# Patient Record
Sex: Male | Born: 1960 | Race: White | Hispanic: No | Marital: Married | Smoking: Former smoker
Health system: Southern US, Community
[De-identification: ages and names within clinical notes are randomized; demographics above are authoritative.]

## PROBLEM LIST (undated history)

## (undated) DIAGNOSIS — I10 Essential (primary) hypertension: Secondary | ICD-10-CM

## (undated) DIAGNOSIS — I2510 Atherosclerotic heart disease of native coronary artery without angina pectoris: Secondary | ICD-10-CM

## (undated) DIAGNOSIS — I509 Heart failure, unspecified: Secondary | ICD-10-CM

## (undated) HISTORY — PX: VASECTOMY: SHX75

## (undated) HISTORY — PX: CARDIAC SURGERY: SHX584

## (undated) HISTORY — PX: CHOLECYSTECTOMY, LAPAROSCOPIC: SHX56

---

## 2008-10-07 ENCOUNTER — Ambulatory Visit: Payer: Self-pay | Admitting: Cardiology

## 2008-10-07 ENCOUNTER — Observation Stay (HOSPITAL_COMMUNITY): Admission: EM | Admit: 2008-10-07 | Discharge: 2008-10-08 | Payer: Self-pay | Admitting: Emergency Medicine

## 2008-10-08 ENCOUNTER — Encounter: Payer: Self-pay | Admitting: Internal Medicine

## 2010-05-29 LAB — CBC
Platelets: 201 10*3/uL (ref 150–400)
RDW: 12.5 % (ref 11.5–15.5)

## 2010-05-29 LAB — POCT CARDIAC MARKERS: Myoglobin, poc: 94.7 ng/mL (ref 12–200)

## 2010-05-29 LAB — POCT I-STAT, CHEM 8
BUN: 16 mg/dL (ref 6–23)
Chloride: 108 mEq/L (ref 96–112)
HCT: 47 % (ref 39.0–52.0)
Sodium: 139 mEq/L (ref 135–145)
TCO2: 23 mmol/L (ref 0–100)

## 2010-05-29 LAB — DIFFERENTIAL
Basophils Absolute: 0 10*3/uL (ref 0.0–0.1)
Basophils Relative: 0 % (ref 0–1)
Eosinophils Relative: 1 % (ref 0–5)
Lymphocytes Relative: 30 % (ref 12–46)
Neutro Abs: 5.4 10*3/uL (ref 1.7–7.7)

## 2010-05-29 LAB — TROPONIN I
Troponin I: 0.01 ng/mL (ref 0.00–0.06)
Troponin I: 0.02 ng/mL (ref 0.00–0.06)

## 2010-05-29 LAB — CK TOTAL AND CKMB (NOT AT ARMC)
Relative Index: 0.8 (ref 0.0–2.5)
Total CK: 121 U/L (ref 7–232)
Total CK: 142 U/L (ref 7–232)

## 2016-09-12 DIAGNOSIS — I10 Essential (primary) hypertension: Secondary | ICD-10-CM | POA: Insufficient documentation

## 2018-09-24 DIAGNOSIS — N183 Chronic kidney disease, stage 3 unspecified: Secondary | ICD-10-CM | POA: Insufficient documentation

## 2020-09-23 ENCOUNTER — Ambulatory Visit (INDEPENDENT_AMBULATORY_CARE_PROVIDER_SITE_OTHER): Payer: No Typology Code available for payment source | Admitting: Family Medicine

## 2020-09-23 ENCOUNTER — Other Ambulatory Visit: Payer: Self-pay

## 2020-09-23 ENCOUNTER — Encounter: Payer: Self-pay | Admitting: Family Medicine

## 2020-09-23 DIAGNOSIS — I10 Essential (primary) hypertension: Secondary | ICD-10-CM

## 2020-09-23 DIAGNOSIS — K219 Gastro-esophageal reflux disease without esophagitis: Secondary | ICD-10-CM | POA: Insufficient documentation

## 2020-09-23 DIAGNOSIS — N529 Male erectile dysfunction, unspecified: Secondary | ICD-10-CM | POA: Diagnosis not present

## 2020-09-23 MED ORDER — EPINEPHRINE 0.3 MG/0.3ML IJ SOAJ: 0.3000 mg | INTRAMUSCULAR | 0 refills | Status: DC | PRN

## 2020-09-23 MED ORDER — LOSARTAN POTASSIUM 100 MG PO TABS: 100.0000 mg | ORAL_TABLET | Freq: Every day | ORAL | 1 refills | Status: DC

## 2020-09-23 MED ORDER — SILDENAFIL CITRATE 20 MG PO TABS: ORAL_TABLET | ORAL | 5 refills | Status: DC

## 2020-09-23 NOTE — Patient Instructions (Signed)
Great to meet you today! Please schedule annual exam at your convenience.  

## 2020-09-23 NOTE — Assessment & Plan Note (Signed)
Using the sildenafil as needed.  This continues to work well for him without side effects.

## 2020-09-23 NOTE — Assessment & Plan Note (Signed)
Blood pressure is mildly elevated today.  Readings at home have been well controlled.  Recommend continuation of losartan in addition to a low-sodium diet.

## 2020-09-23 NOTE — Progress Notes (Signed)
Paul Hardy - 59 y.o. male MRN [id number]  Date of birth: Jul 10, 1960  Subjective Chief Complaint  Patient presents with   Establish Care   Hypertension    HPI Paul Hardy is a 60 year old male here today for initial visit to establish care.  He has history of hypertension and GERD.  He reports he is doing well for the most part.  He did receive a yellowjacket sting yesterday.  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  He took Benadryl.  He did not feel he needed to use an EpiPen.  Also his EpiPen is expired.  He is doing well with losartan for management of hypertension.  He is taking daily as directed.  He has not had any symptoms related to his blood pressure including chest pain, shortness of breath, palpitations, headache or vision changes.  He is taking sildenafil as needed for ED.  This works well.  ROS:  A comprehensive ROS was completed and negative except as noted per HPI  Allergies  Allergen Reactions   Bee Venom Anaphylaxis   Iodinated Diagnostic Agents Anaphylaxis    And cardiac arrest   Hydrochlorothiazide Other (See Comments)    Dehydration and muscle spasms. Lasix does the same   Lisinopril Cough    Losartan is OK    History reviewed. No pertinent past medical history.  Past Surgical History:  Procedure Laterality Date   CHOLECYSTECTOMY, LAPAROSCOPIC     VASECTOMY      Social History   Socioeconomic History   Marital status: Married    Spouse name: Not on file   Number of children: Not on file   Years of education: Not on file   Highest education level: Not on file  Occupational History   Not on file  Tobacco Use   Smoking status: Every Day    Packs/day: 1.00    Years: 20.00    Pack years: 20.00    Types: Cigarettes    Start date: 02/21/1998    Passive exposure: Never   Smokeless tobacco: Never  Vaping Use   Vaping Use: Never used  Substance and Sexual Activity   Alcohol use: Never   Drug use: Never   Sexual activity: Yes    Partners: Female  Other  Topics Concern   Not on file  Social History Narrative   Not on file   Social Determinants of Health   Financial Resource Strain: Not on file  Food Insecurity: Not on file  Transportation Needs: Not on file  Physical Activity: Not on file  Stress: Not on file  Social Connections: Not on file    History reviewed. No pertinent family history.  Health Maintenance  Topic Date Due   Pneumococcal Vaccine 68-56 Years old (1 - PCV) Never done   HIV Screening  Never done   Hepatitis C Screening  Never done   COLONOSCOPY (Pts 45-4yrs Insurance coverage will need to be confirmed)  Never done   INFLUENZA VACCINE  09/21/2020   TETANUS/TDAP  06/07/2029   COVID-19 Vaccine  Completed   Zoster Vaccines- Shingrix  Completed   HPV VACCINES  Aged Out     ----------------------------------------------------------------------------------------------------------------------------------------------------------------------------------------------------------------- Physical Exam BP 140/90 (BP Location: Left Arm, Patient Position: Sitting, Cuff Size: Large)   Pulse 64   Temp 97.6 F (36.4 C)   Ht 5\' 11"  (1.803 m)   Wt 209 lb (94.8 kg)   SpO2 100%   BMI 29.15 kg/m   Physical Exam Constitutional:      Appearance: Normal appearance.  HENT:     Head: Normocephalic and atraumatic.  Eyes:     General: No scleral icterus. Cardiovascular:     Rate and Rhythm: Normal rate and regular rhythm.  Pulmonary:     Effort: Pulmonary effort is normal.     Breath sounds: Normal breath sounds.  Musculoskeletal:     Cervical back: Neck supple.  Neurological:     General: No focal deficit present.     Mental Status: He is alert.  Psychiatric:        Mood and Affect: Mood normal.        Behavior: Behavior normal.     ------------------------------------------------------------------------------------------------------------------------------------------------------------------------------------------------------------------- Assessment and Plan  Essential hypertension Blood pressure is mildly elevated today.  Readings at home have been well controlled.  Recommend continuation of losartan in addition to a low-sodium diet.  ED (erectile dysfunction) Using the sildenafil as needed.  This continues to work well for him without side effects.   Meds ordered this encounter  Medications   sildenafil (REVATIO) 20 MG tablet    Sig: Take as few as 2 and as many as 5 by mouth at least one hour prior to sex    Dispense:  30 tablet    Refill:  5   losartan (COZAAR) 100 MG tablet    Sig: Take 1 tablet (100 mg total) by mouth daily.    Dispense:  90 tablet    Refill:  1   EPINEPHrine 0.3 mg/0.3 mL IJ SOAJ injection    Sig: Inject 0.3 mg into the muscle as needed for anaphylaxis (bee sting).    Dispense:  1 each    Refill:  0    No follow-ups on file.    This visit occurred during the SARS-CoV-2 public health emergency.  Safety protocols were in place, including screening questions prior to the visit, additional usage of staff PPE, and extensive cleaning of exam room while observing appropriate contact time as indicated for disinfecting solutions.

## 2020-10-13 ENCOUNTER — Other Ambulatory Visit: Payer: Self-pay

## 2020-10-13 DIAGNOSIS — N529 Male erectile dysfunction, unspecified: Secondary | ICD-10-CM

## 2020-10-13 MED ORDER — SILDENAFIL CITRATE 20 MG PO TABS: ORAL_TABLET | ORAL | 5 refills | Status: DC

## 2020-10-21 ENCOUNTER — Other Ambulatory Visit: Payer: Self-pay

## 2020-10-21 ENCOUNTER — Encounter: Payer: Self-pay | Admitting: Family Medicine

## 2020-10-21 ENCOUNTER — Ambulatory Visit (INDEPENDENT_AMBULATORY_CARE_PROVIDER_SITE_OTHER): Payer: No Typology Code available for payment source | Admitting: Family Medicine

## 2020-10-21 VITALS — BP 136/77 | HR 75 | Temp 97.7°F | Ht 71.0 in | Wt 210.0 lb

## 2020-10-21 DIAGNOSIS — Z125 Encounter for screening for malignant neoplasm of prostate: Secondary | ICD-10-CM

## 2020-10-21 DIAGNOSIS — I10 Essential (primary) hypertension: Secondary | ICD-10-CM

## 2020-10-21 DIAGNOSIS — Z13220 Encounter for screening for lipoid disorders: Secondary | ICD-10-CM | POA: Diagnosis not present

## 2020-10-21 DIAGNOSIS — Z0000 Encounter for general adult medical examination without abnormal findings: Secondary | ICD-10-CM | POA: Insufficient documentation

## 2020-10-21 DIAGNOSIS — Z1211 Encounter for screening for malignant neoplasm of colon: Secondary | ICD-10-CM

## 2020-10-21 NOTE — Assessment & Plan Note (Addendum)
Well adult Orders Placed This Encounter  Procedures  . COMPLETE METABOLIC PANEL WITH GFR  . CBC with Differential  . Lipid Panel w/reflex Direct LDL  . PSA  . Cologuard  . EKG 12-Lead  Screening: Cologuard, PSA Immunizations: Declined flu vaccine and shingles vaccine. Anticipatory guidance/risk factor reduction: Recommendations per AVS.

## 2020-10-21 NOTE — Progress Notes (Signed)
Paul Hardy - 60 y.o. male MRN [id number]  Date of birth: Aug 22, 1960  Subjective Chief Complaint  Patient presents with   Annual Exam    HPI Paul Hardy is a 60 year old male here today for annual exam.  He reports he is doing well at this time.  He would like to have a an EKG to have on file for a baseline.  He is fasting for labs today.    He is a current smoker and is not ready to quit at this time.Marland Kitchen  He denies alcohol use.  He does stay fairly active and feels like his diet is pretty good.  He declines flu vaccine.  He is due for colon cancer screening.  Review of Systems  Constitutional:  Negative for chills, fever, malaise/fatigue and weight loss.  HENT:  Negative for congestion, ear pain and sore throat.   Eyes:  Negative for blurred vision, double vision and pain.  Respiratory:  Negative for cough and shortness of breath.   Cardiovascular:  Negative for chest pain and palpitations.  Gastrointestinal:  Negative for abdominal pain, blood in stool, constipation, heartburn and nausea.  Genitourinary:  Negative for dysuria and urgency.  Musculoskeletal:  Negative for joint pain and myalgias.  Neurological:  Negative for dizziness and headaches.  Endo/Heme/Allergies:  Does not bruise/bleed easily.  Psychiatric/Behavioral:  Negative for depression. The patient is not nervous/anxious and does not have insomnia.    Allergies  Allergen Reactions   Bee Venom Anaphylaxis   Iodinated Diagnostic Agents Anaphylaxis    And cardiac arrest   Hydrochlorothiazide Other (See Comments)    Dehydration and muscle spasms. Lasix does the same   Lisinopril Cough    Losartan is OK    History reviewed. No pertinent past medical history.  Past Surgical History:  Procedure Laterality Date   CHOLECYSTECTOMY, LAPAROSCOPIC     VASECTOMY      Social History   Socioeconomic History   Marital status: Married    Spouse name: Not on file   Number of children: Not on file   Years of education:  Not on file   Highest education level: Not on file  Occupational History   Not on file  Tobacco Use   Smoking status: Every Day    Packs/day: 1.00    Years: 20.00    Pack years: 20.00    Types: Cigarettes    Start date: 02/21/1998    Passive exposure: Never   Smokeless tobacco: Never  Vaping Use   Vaping Use: Never used  Substance and Sexual Activity   Alcohol use: Never   Drug use: Never   Sexual activity: Yes    Partners: Female  Other Topics Concern   Not on file  Social History Narrative   Not on file   Social Determinants of Health   Financial Resource Strain: Not on file  Food Insecurity: Not on file  Transportation Needs: Not on file  Physical Activity: Not on file  Stress: Not on file  Social Connections: Not on file    History reviewed. No pertinent family history.  Health Maintenance  Topic Date Due   Pneumococcal Vaccine 31-3 Years old (1 - PCV) Never done   HIV Screening  Never done   Hepatitis C Screening  Never done   COLONOSCOPY (Pts 45-73yrs Insurance coverage will need to be confirmed)  Never done   INFLUENZA VACCINE  05/21/2021 (Originally 09/21/2020)   TETANUS/TDAP  06/07/2029   COVID-19 Vaccine  Completed   Zoster Vaccines- Shingrix  Completed   HPV VACCINES  Aged Out     ----------------------------------------------------------------------------------------------------------------------------------------------------------------------------------------------------------------- Physical Exam BP 136/77 (BP Location: Left Arm, Patient Position: Sitting, Cuff Size: Large)   Pulse 75   Temp 97.7 F (36.5 C)   Ht 5\' 11"  (1.803 m)   Wt 210 lb (95.3 kg)   SpO2 97%   BMI 29.29 kg/m   Physical Exam Constitutional:      General: He is not in acute distress. HENT:     Head: Normocephalic and atraumatic.     Right Ear: Tympanic membrane and external ear normal.     Left Ear: Tympanic membrane and external ear normal.  Eyes:     General: No  scleral icterus. Neck:     Thyroid: No thyromegaly.  Cardiovascular:     Rate and Rhythm: Normal rate and regular rhythm.     Heart sounds: Normal heart sounds.  Pulmonary:     Effort: Pulmonary effort is normal.     Breath sounds: Normal breath sounds.  Abdominal:     General: Bowel sounds are normal. There is no distension.     Palpations: Abdomen is soft.     Tenderness: There is no abdominal tenderness. There is no guarding.  Musculoskeletal:     Cervical back: Normal range of motion.  Lymphadenopathy:     Cervical: No cervical adenopathy.  Skin:    General: Skin is warm and dry.     Findings: No rash.  Neurological:     Mental Status: He is alert and oriented to person, place, and time.     Cranial Nerves: No cranial nerve deficit.     Motor: No abnormal muscle tone.  Psychiatric:        Mood and Affect: Mood normal.        Behavior: Behavior normal.   EKG: Normal sinus rhythm without ST-T wave changes.  Normal PR and QTc intervals.  That ------------------------------------------------------------------------------------------------------------------------------------------------------------------------------------------------------------------- Assessment and Plan  Well adult exam Well adult Orders Placed This Encounter  Procedures   COMPLETE METABOLIC PANEL WITH GFR   CBC with Differential   Lipid Panel w/reflex Direct LDL   PSA   Cologuard   EKG 12-Lead  Screening: Cologuard, PSA Immunizations: Declined flu vaccine and shingles vaccine.   No orders of the defined types were placed in this encounter.   No follow-ups on file.    This visit occurred during the SARS-CoV-2 public health emergency.  Safety protocols were in place, including screening questions prior to the visit, additional usage of staff PPE, and extensive cleaning of exam room while observing appropriate contact time as indicated for disinfecting solutions.

## 2020-10-21 NOTE — Patient Instructions (Signed)
Preventive Care 40-60 Years Old, Male Preventive care refers to lifestyle choices and visits with your health care provider that can promote health and wellness. This includes: A yearly physical exam. This is also called an annual wellness visit. Regular dental and eye exams. Immunizations. Screening for certain conditions. Healthy lifestyle choices, such as: Eating a healthy diet. Getting regular exercise. Not using drugs or products that contain nicotine and tobacco. Limiting alcohol use. What can I expect for my preventive care visit? Physical exam Your health care provider will check your: Height and weight. These may be used to calculate your BMI (body mass index). BMI is a measurement that tells if you are at a healthy weight. Heart rate and blood pressure. Body temperature. Skin for abnormal spots. Counseling Your health care provider may ask you questions about your: Past medical problems. Family's medical history. Alcohol, tobacco, and drug use. Emotional well-being. Home life and relationship well-being. Sexual activity. Diet, exercise, and sleep habits. Work and work environment. Access to firearms. What immunizations do I need? Vaccines are usually given at various ages, according to a schedule. Your health care provider will recommend vaccines for you based on your age, medical history, and lifestyle or other factors, such as travel or where you work. What tests do I need? Blood tests Lipid and cholesterol levels. These may be checked every 5 years, or more often if you are over 50 years old. Hepatitis C test. Hepatitis B test. Screening Lung cancer screening. You may have this screening every year starting at age 55 if you have a 30-pack-year history of smoking and currently smoke or have quit within the past 15 years. Prostate cancer screening. Recommendations will vary depending on your family history and other risks. Genital exam to check for testicular cancer  or hernias. Colorectal cancer screening. All adults should have this screening starting at age 50 and continuing until age 75. Your health care provider may recommend screening at age 45 if you are at increased risk. You will have tests every 1-10 years, depending on your results and the type of screening test. Diabetes screening. This is done by checking your blood sugar (glucose) after you have not eaten for a while (fasting). You may have this done every 1-3 years. STD (sexually transmitted disease) testing, if you are at risk. Follow these instructions at home: Eating and drinking  Eat a diet that includes fresh fruits and vegetables, whole grains, lean protein, and low-fat dairy products. Take vitamin and mineral supplements as recommended by your health care provider. Do not drink alcohol if your health care provider tells you not to drink. If you drink alcohol: Limit how much you have to 0-2 drinks a day. Be aware of how much alcohol is in your drink. In the [place] one drink equals one 12 oz bottle of beer (355 mL), one 5 oz glass of wine (148 mL), or one 1 oz glass of hard liquor (44 mL). Lifestyle Take daily care of your teeth and gums. Brush your teeth every morning and night with fluoride toothpaste. Floss one time each day. Stay active. Exercise for at least 30 minutes 5 or more days each week. Do not use any products that contain nicotine or tobacco, such as cigarettes, e-cigarettes, and chewing tobacco. If you need help quitting, ask your health care provider. Do not use drugs. If you are sexually active, practice safe sex. Use a condom or other form of protection to prevent STIs (sexually transmitted infections). If told by your   health care provider, take low-dose aspirin daily starting at age 50. Find healthy ways to cope with stress, such as: Meditation, yoga, or listening to music. Journaling. Talking to a trusted person. Spending time with friends and  family. Safety Always wear your seat belt while driving or riding in a vehicle. Do not drive: If you have been drinking alcohol. Do not ride with someone who has been drinking. When you are tired or distracted. While texting. Wear a helmet and other protective equipment during sports activities. If you have firearms in your house, make sure you follow all gun safety procedures. What's next? Go to your health care provider once a year for an annual wellness visit. Ask your health care provider how often you should have your eyes and teeth checked. Stay up to date on all vaccines. This information is not intended to replace advice given to you by your health care provider. Make sure you discuss any questions you have with your health care provider. Document Revised: 04/17/2020 Document Reviewed: 02/01/2018 Elsevier Patient Education  2022 [employer]   

## 2020-10-22 ENCOUNTER — Encounter: Payer: Self-pay | Admitting: Family Medicine

## 2020-10-22 DIAGNOSIS — Z0000 Encounter for general adult medical examination without abnormal findings: Secondary | ICD-10-CM

## 2020-10-22 DIAGNOSIS — I10 Essential (primary) hypertension: Secondary | ICD-10-CM

## 2020-10-22 LAB — COMPLETE METABOLIC PANEL WITH GFR
AG Ratio: 1.7 (calc) (ref 1.0–2.5)
ALT: 19 U/L (ref 9–46)
AST: 21 U/L (ref 10–35)
Albumin: 4.3 g/dL (ref 3.6–5.1)
Alkaline phosphatase (APISO): 81 U/L (ref 35–144)
BUN: 16 mg/dL (ref 7–25)
CO2: 24 mmol/L (ref 20–32)
Calcium: 9.2 mg/dL (ref 8.6–10.3)
Chloride: 104 mmol/L (ref 98–110)
Creat: 1.31 mg/dL (ref 0.70–1.35)
Globulin: 2.5 g/dL (calc) (ref 1.9–3.7)
Glucose, Bld: 90 mg/dL (ref 65–99)
Potassium: 4.2 mmol/L (ref 3.5–5.3)
Sodium: 138 mmol/L (ref 135–146)
Total Bilirubin: 0.6 mg/dL (ref 0.2–1.2)
Total Protein: 6.8 g/dL (ref 6.1–8.1)
eGFR: 62 mL/min/{1.73_m2} (ref >=60)

## 2020-10-22 LAB — LIPID PANEL W/REFLEX DIRECT LDL
Cholesterol: 167 mg/dL (ref <200)
HDL: 37 mg/dL — ABNORMAL LOW (ref >=40)
LDL Cholesterol (Calc): 98 mg/dL (calc)
Non-HDL Cholesterol (Calc): 130 mg/dL (calc) — ABNORMAL HIGH (ref <130)
Total CHOL/HDL Ratio: 4.5 (calc) (ref <5.0)
Triglycerides: 204 mg/dL — ABNORMAL HIGH (ref <150)

## 2020-10-22 LAB — CBC WITH DIFFERENTIAL/PLATELET

## 2020-10-22 LAB — PSA: PSA: 0.82 ng/mL (ref <=4.00)

## 2020-10-22 NOTE — Telephone Encounter (Signed)
Yes, let's have him return for CBC

## 2020-10-23 LAB — CBC WITH DIFFERENTIAL/PLATELET
Absolute Monocytes: 541 cells/uL (ref 200–950)
Basophils Absolute: 49 cells/uL (ref 0–200)
Basophils Relative: 0.6 %
Eosinophils Absolute: 156 cells/uL (ref 15–500)
Eosinophils Relative: 1.9 %
HCT: 47.3 % (ref 38.5–50.0)
Hemoglobin: 15.8 g/dL (ref 13.2–17.1)
Lymphs Abs: 2927 cells/uL (ref 850–3900)
MCH: 30.2 pg (ref 27.0–33.0)
MCHC: 33.4 g/dL (ref 32.0–36.0)
MCV: 90.4 fL (ref 80.0–100.0)
MPV: 9.7 fL (ref 7.5–12.5)
Monocytes Relative: 6.6 %
Neutro Abs: 4526 cells/uL (ref 1500–7800)
Neutrophils Relative %: 55.2 %
Platelets: 247 10*3/uL (ref 140–400)
RBC: 5.23 10*6/uL (ref 4.20–5.80)
RDW: 11.8 % (ref 11.0–15.0)
Total Lymphocyte: 35.7 %
WBC: 8.2 10*3/uL (ref 3.8–10.8)

## 2020-11-08 LAB — COLOGUARD: Cologuard: NEGATIVE

## 2020-11-09 ENCOUNTER — Encounter: Payer: Self-pay | Admitting: Family Medicine

## 2020-11-25 DIAGNOSIS — I213 ST elevation (STEMI) myocardial infarction of unspecified site: Secondary | ICD-10-CM | POA: Insufficient documentation

## 2020-11-26 DIAGNOSIS — R739 Hyperglycemia, unspecified: Secondary | ICD-10-CM | POA: Insufficient documentation

## 2020-11-26 DIAGNOSIS — Z789 Other specified health status: Secondary | ICD-10-CM | POA: Insufficient documentation

## 2020-11-26 DIAGNOSIS — F1090 Alcohol use, unspecified, uncomplicated: Secondary | ICD-10-CM | POA: Insufficient documentation

## 2020-12-02 DIAGNOSIS — Z951 Presence of aortocoronary bypass graft: Secondary | ICD-10-CM | POA: Insufficient documentation

## 2020-12-03 ENCOUNTER — Telehealth: Payer: Self-pay

## 2020-12-03 NOTE — Telephone Encounter (Signed)
Transition Care Management Unsuccessful Follow-up Telephone Call  Date of discharge and from where:  12/02/2020  [facility]  Attempts:  1st Attempt  Reason for unsuccessful TCM follow-up call:  No answer/busy  unidentified voice mail picked up. No message left  Rowe Pavy, RN, BSN, [facility] [employer] [phone]

## 2020-12-03 NOTE — Telephone Encounter (Signed)
Transition Care Management Unsuccessful Follow-up Telephone Call  Date of discharge and from where:  12/02/2020 from [facility]  Attempts:  1st Attempt  Reason for unsuccessful TCM follow-up call:  Left voice message

## 2020-12-04 ENCOUNTER — Other Ambulatory Visit: Payer: Self-pay

## 2020-12-04 ENCOUNTER — Encounter: Payer: Self-pay | Admitting: Family Medicine

## 2020-12-04 ENCOUNTER — Ambulatory Visit (INDEPENDENT_AMBULATORY_CARE_PROVIDER_SITE_OTHER): Payer: No Typology Code available for payment source | Admitting: Family Medicine

## 2020-12-04 VITALS — BP 130/88 | HR 101 | Temp 97.7°F | Ht 71.0 in | Wt 210.0 lb

## 2020-12-04 DIAGNOSIS — R2681 Unsteadiness on feet: Secondary | ICD-10-CM

## 2020-12-04 DIAGNOSIS — F4322 Adjustment disorder with anxiety: Secondary | ICD-10-CM

## 2020-12-04 DIAGNOSIS — Z23 Encounter for immunization: Secondary | ICD-10-CM | POA: Diagnosis not present

## 2020-12-04 DIAGNOSIS — Z951 Presence of aortocoronary bypass graft: Secondary | ICD-10-CM

## 2020-12-04 MED ORDER — AMBULATORY NON FORMULARY MEDICATION: 0 refills | Status: DC

## 2020-12-04 MED ORDER — ALPRAZOLAM 0.5 MG PO TABS: 0.2500 mg | ORAL_TABLET | Freq: Two times a day (BID) | ORAL | 1 refills | Status: DC | PRN

## 2020-12-04 MED ORDER — ONDANSETRON 4 MG PO TBDP: 4.0000 mg | ORAL_TABLET | Freq: Three times a day (TID) | ORAL | 0 refills | Status: DC | PRN

## 2020-12-04 NOTE — Telephone Encounter (Signed)
Transition Care Management Unsuccessful Follow-up Telephone Call  Date of discharge and from where:  12/02/2020 from [facility]  Attempts:  2nd Attempt  Reason for unsuccessful TCM follow-up call:  Left voice message

## 2020-12-04 NOTE — Patient Instructions (Signed)
Coronary Artery Bypass Grafting, Care After This sheet gives you information about how to care for yourself after your procedure. Your health care provider may also give you more specific instructions. If you have problems or questions, contact your health care provider. What can I expect after the procedure? After the procedure, it is common to have: Nausea. Lack of appetite. Constipation. Weakness and fatigue. Depression or irritability. Pain or discomfort in your incision areas. Follow these instructions at home: Medicines Take over-the-counter and prescription medicines only as told by your health care provider. Do not stop taking medicines or start any new medicines without approval from your health care provider. If you were prescribed an antibiotic medicine, take it as told by your health care provider. Do not stop using the antibiotic even if you start to feel better. Incision care  Follow instructions from your health care provider about how to take care of your incisions. Make sure you: Wash your hands with soap and water before and after you change your bandage (dressing). If soap and water are not available, use hand sanitizer. Change your dressing as told by your health care provider. Leave stitches (sutures), skin glue, or adhesive strips in place. These skin closures may need to stay in place for 2 weeks or longer. If adhesive strip edges start to loosen and curl up, you may trim the loose edges. Do not remove adhesive strips completely unless your health care provider tells you to do that. Keep incision areas clean, dry, and protected. Check your incision areas every day for signs of infection. Check for: More redness, swelling, or pain. More fluid or blood. Warmth. Pus or a bad smell. If incisions were made in your legs: Avoid crossing your legs. Avoid sitting for long periods of time. Change positions every 30 minutes. Raise (elevate) your legs when you are  sitting. Bathing Do not take baths, swim, or use a hot tub until your health care provider approves. Only take sponge baths. Pat the incisions dry. Do not rub incisions with a washcloth or towel. Ask your health care provider when you can shower. Eating and drinking  Eat foods that are high in fiber, such as beans, nuts, whole grains, and raw fruits and vegetables. Meats, if eaten, should be lean cut. Avoid canned, processed, and fried foods. This can help prevent constipation and is a recommended part of a heart-healthy diet. Drink enough fluid to keep your urine pale yellow. Do not drink alcohol until your recovery is complete. Ask your health care provider when it is safe to drink alcohol. Activity Rest and limit your activity as told by your health care provider. You may be instructed to: Stop any activity right away if you have chest pain, shortness of breath, irregular heartbeats, or dizziness. Get help right away if you have any of these symptoms. Move around frequently for short periods or take short walks as told by your health care provider. Gradually increase your activities. Avoid lifting, pushing, or pulling anything that is heavier than 10 lb (4.5 kg) for at least 6 weeks or as told by your health care provider. Participate in physical therapy or a cardiac rehabilitation program as told by your health care provider. Physical therapy involves doing exercises to maintain movement, strengthen your muscles, and build your endurance. A cardiac rehabilitation program is a treatment program to improve your health and well-being through exercise training, education, and counseling. Do not drive until your health care provider approves. Ask your health care provider when  you may return to work. Ask your health care provider when you may resume sexual activity. General instructions Do not drive or use heavy machinery while taking prescription pain medicine. Do not use any products that  contain nicotine or tobacco, such as cigarettes, e-cigarettes, and chewing tobacco. If you need help quitting, ask your health care provider. Take 2-3 deep breaths every few hours during the day, while you recover. This helps expand your lungs and prevent complications like pneumonia after surgery. If you were given a device called an incentive spirometer, use it several times a day to practice deep breathing. Support your chest with a pillow or your arms when you take deep breaths or cough. Wear compression stockings as told by your health care provider. These stockings help to prevent blood clots and reduce swelling in your legs. Weigh yourself every day. This helps identify if your body is holding (retaining) fluid that may make your heart and lungs work harder. Keep all follow-up visits as told by your health care provider. This is important. Contact a health care provider if: You have more redness, swelling, or pain around any incision. You have more fluid or blood coming from any incision. Any incision feels warm to the touch. You have pus or a bad smell coming from any incision. You have a fever. You have swelling in your ankles or legs. You have pain in your legs. You gain 2 lb (0.9 kg) or more a day. You are nauseous or you vomit. You have diarrhea. Get help right away if: You have chest pain that spreads to your jaw or arms. You are short of breath. You have a fast or irregular heartbeat. You notice a "clicking" in your breastbone (sternum) when you move. You have any symptoms of a stroke. "BE FAST" is an easy way to remember the main warning signs of a stroke: B - Balance. Signs are dizziness, sudden trouble walking, or loss of balance. E - Eyes. Signs are trouble seeing or a sudden change in vision. F - Face. Signs are sudden weakness or numbness of the face, or the face or eyelid drooping on one side. A - Arms. Signs are weakness or numbness in an arm. This happens suddenly and  usually on one side of the body. S - Speech. Signs are sudden trouble speaking, slurred speech, or trouble understanding what people say. T - Time. Time to call emergency services. Write down what time symptoms started. You have other signs of a stroke, such as: A sudden, severe headache with no known cause. Nausea or vomiting. Seizure. These symptoms may represent a serious problem that is an emergency. Do not wait to see if the symptoms will go away. Get medical help right away. Call your local emergency services (911 in the [place] Do not drive yourself to the hospital. Summary After the procedure, it is common to have pain or discomfort in the incision areas. Do not take baths, swim, or use a hot tub until your health care provider approves. Gradually increase your activities. You may need physical therapy or cardiac rehabilitation to help strengthen your muscles and build your endurance. Weigh yourself every day. This helps identify if your body is holding (retaining) fluid that may make your heart and lungs work harder. This information is not intended to replace advice given to you by your health care provider. Make sure you discuss any questions you have with your health care provider. Document Revised: 06/09/2020 Document Reviewed: 06/09/2020 Elsevier Patient Education  2022 [employer]  

## 2020-12-06 DIAGNOSIS — F4320 Adjustment disorder, unspecified: Secondary | ICD-10-CM | POA: Insufficient documentation

## 2020-12-06 NOTE — Progress Notes (Signed)
Paul Hardy - 60 y.o. male MRN [id number]  Date of birth: 1960-07-27  Subjective No chief complaint on file.   HPI Paul Hardy is a 60 year old male here today for hospital follow-up.  Presented to ED with complaint of left-sided chest pain and EKG showing STEMI.  He underwent cardiac catheterization and was advised to proceed with surgical intervention.  He underwent CABG x3.  He reports that in regards to his heart he seems to be doing well.  He denies any new chest pain.  His only using Tylenol as needed for pain control.  He is taking full-strength aspirin, metoprolol and atorvastatin.  He has follow-up with his cardiothoracic surgeon in about 2 weeks.  His main concern today is anxiety.  He has developed increased anxiety since his hospitalization.  He is having significant difficulty with sleep.  Reports that during his hospitalization he was given Xanax and/or Ambien which was helpful.  He has never taken anything like this prior to his hospitalization.  He denies any significant side effects related to medication.  ROS:  A comprehensive ROS was completed and negative except as noted per HPI  Allergies  Allergen Reactions   Bee Venom Anaphylaxis   Iodinated Diagnostic Agents Anaphylaxis    And cardiac arrest   Hydrochlorothiazide Other (See Comments)    Dehydration and muscle spasms. Lasix does the same   Lisinopril Cough    Losartan is OK    History reviewed. No pertinent past medical history.  Past Surgical History:  Procedure Laterality Date   CHOLECYSTECTOMY, LAPAROSCOPIC     VASECTOMY      Social History   Socioeconomic History   Marital status: Married    Spouse name: Not on file   Number of children: Not on file   Years of education: Not on file   Highest education level: Not on file  Occupational History   Not on file  Tobacco Use   Smoking status: Every Day    Packs/day: 1.00    Years: 20.00    Pack years: 20.00    Types: Cigarettes    Start date: 02/21/1998     Passive exposure: Never   Smokeless tobacco: Never  Vaping Use   Vaping Use: Never used  Substance and Sexual Activity   Alcohol use: Never   Drug use: Never   Sexual activity: Yes    Partners: Female  Other Topics Concern   Not on file  Social History Narrative   Not on file   Social Determinants of Health   Financial Resource Strain: Not on file  Food Insecurity: Not on file  Transportation Needs: Not on file  Physical Activity: Not on file  Stress: Not on file  Social Connections: Not on file    History reviewed. No pertinent family history.  Health Maintenance  Topic Date Due   HIV Screening  Never done   Hepatitis C Screening  Never done   Fecal DNA (Cologuard)  11/04/2023   TETANUS/TDAP  06/07/2029   INFLUENZA VACCINE  Completed   COVID-19 Vaccine  Completed   Zoster Vaccines- Shingrix  Completed   HPV VACCINES  Aged Out     ----------------------------------------------------------------------------------------------------------------------------------------------------------------------------------------------------------------- Physical Exam BP 130/88   Pulse (!) 101   Temp 97.7 F (36.5 C) (Oral)   Ht 5\' 11"  (1.803 m)   Wt 210 lb (95.3 kg)   SpO2 100%   BMI 29.29 kg/m   Physical Exam Constitutional:      Appearance: Normal appearance.  Eyes:  General: No scleral icterus. Cardiovascular:     Rate and Rhythm: Normal rate and regular rhythm.     Comments: Median sternotomy appears to be healing well. Pulmonary:     Effort: Pulmonary effort is normal.     Breath sounds: Normal breath sounds.  Musculoskeletal:     Cervical back: Neck supple.  Neurological:     General: No focal deficit present.     Mental Status: He is alert.  Psychiatric:        Mood and Affect: Mood normal.        Behavior: Behavior normal.     ------------------------------------------------------------------------------------------------------------------------------------------------------------------------------------------------------------------- Assessment and Plan  Adjustment disorder He is having increased anxiety and difficulty sleeping since having MI and CABG.  We discussed adding alprazolam back on temporarily to help with his current sleep difficulties.  We discussed the potential for dependence on with and tried to use the lowest minimal effective dose. :   S/P CABG x 3 Recent STEMI with CABG x3.  We will add on Rollator walker to use to help steady gait.  We will also write prescription for incentive spirometer for him to use frequently.   Meds ordered this encounter  Medications   AMBULATORY NON FORMULARY MEDICATION    Sig: Please provide incentive spirometer.  Use every 2 hours.  Dx: Post-operative state/CABG Z95.1    Dispense:  1 Device    Refill:  0   AMBULATORY NON FORMULARY MEDICATION    Sig: Please provide rollator walker.  Diagnosis: Gait instability, CABG R26.81, Z95.1    Dispense:  1 Device    Refill:  0   ALPRAZolam (XANAX) 0.5 MG tablet    Sig: Take 0.5-1 tablets (0.25-0.5 mg total) by mouth 2 (two) times daily as needed for anxiety.    Dispense:  30 tablet    Refill:  1   ondansetron (ZOFRAN ODT) 4 MG disintegrating tablet    Sig: Take 1 tablet (4 mg total) by mouth every 8 (eight) hours as needed for nausea or vomiting.    Dispense:  20 tablet    Refill:  0    No follow-ups on file.    This visit occurred during the SARS-CoV-2 public health emergency.  Safety protocols were in place, including screening questions prior to the visit, additional usage of staff PPE, and extensive cleaning of exam room while observing appropriate contact time as indicated for disinfecting solutions.

## 2020-12-06 NOTE — Assessment & Plan Note (Signed)
Recent STEMI with CABG x3.  We will add on Rollator walker to use to help steady gait.  We will also write prescription for incentive spirometer for him to use frequently.

## 2020-12-06 NOTE — Assessment & Plan Note (Signed)
He is having increased anxiety and difficulty sleeping since having MI and CABG.  We discussed adding alprazolam back on temporarily to help with his current sleep difficulties.  We discussed the potential for dependence on with and tried to use the lowest minimal effective dose. :

## 2020-12-07 ENCOUNTER — Telehealth: Payer: Self-pay

## 2020-12-07 NOTE — Telephone Encounter (Signed)
Pt's spouse called concerning Disability placard documentation.   Advised Lurena Joiner to drop the paperwork by the [employer] with his information filled out. Dr. Ashley Royalty will sign off and the office will contact her for pick-up.

## 2020-12-07 NOTE — Telephone Encounter (Signed)
Transition Care Management Unsuccessful Follow-up Telephone Call  Date of discharge and from where:  12/02/2020 from [facility]  Attempts:  3rd Attempt  Reason for unsuccessful TCM follow-up call:  Unable to reach patient

## 2020-12-28 ENCOUNTER — Telehealth: Payer: Self-pay | Admitting: General Practice

## 2020-12-28 NOTE — Telephone Encounter (Signed)
Transition Care Management Unsuccessful Follow-up Telephone Call  Date of discharge and from where:  12/25/20 from [facility]  Attempts:  1st Attempt  Reason for unsuccessful TCM follow-up call:  No answer/busy

## 2020-12-30 NOTE — Telephone Encounter (Signed)
Transition Care Management Unsuccessful Follow-up Telephone Call  Date of discharge and from where:  12/25/20 from [facility]  Attempts:  2nd Attempt  Reason for unsuccessful TCM follow-up call:  No answer/busy

## 2021-01-01 NOTE — Telephone Encounter (Signed)
Transition Care Management Unsuccessful Follow-up Telephone Call  Date of discharge and from where:  12/25/20 from [facility]  Attempts:  2nd Attempt  Reason for unsuccessful TCM follow-up call:  No answer/busy Patient does have OV scheduled with Dr. Ashley Royalty on 01/18/21

## 2021-01-04 DIAGNOSIS — R0602 Shortness of breath: Secondary | ICD-10-CM | POA: Insufficient documentation

## 2021-01-11 ENCOUNTER — Telehealth: Payer: Self-pay | Admitting: General Practice

## 2021-01-11 NOTE — Telephone Encounter (Signed)
Transition Care Management Unsuccessful Follow-up Telephone Call  Date of discharge and from where:  01/11/21 from [facility]  Attempts:  1st Attempt  Reason for unsuccessful TCM follow-up call:  Left voice message

## 2021-01-13 NOTE — Telephone Encounter (Signed)
Transition Care Management Unsuccessful Follow-up Telephone Call  Date of discharge and from where:  01/11/21 from [facility]  Attempts:  2nd Attempt  Reason for unsuccessful TCM follow-up call:  Left voice message   Has an appt scheduled for 01/18/21

## 2021-01-18 ENCOUNTER — Other Ambulatory Visit: Payer: Self-pay

## 2021-01-18 ENCOUNTER — Other Ambulatory Visit: Payer: Self-pay | Admitting: *Deleted

## 2021-01-18 ENCOUNTER — Ambulatory Visit (INDEPENDENT_AMBULATORY_CARE_PROVIDER_SITE_OTHER): Payer: No Typology Code available for payment source | Admitting: Family Medicine

## 2021-01-18 ENCOUNTER — Encounter: Payer: Self-pay | Admitting: Family Medicine

## 2021-01-18 DIAGNOSIS — I255 Ischemic cardiomyopathy: Secondary | ICD-10-CM

## 2021-01-18 DIAGNOSIS — I10 Essential (primary) hypertension: Secondary | ICD-10-CM | POA: Diagnosis not present

## 2021-01-18 DIAGNOSIS — R0602 Shortness of breath: Secondary | ICD-10-CM

## 2021-01-18 DIAGNOSIS — F4322 Adjustment disorder with anxiety: Secondary | ICD-10-CM

## 2021-01-18 MED ORDER — ESCITALOPRAM OXALATE 10 MG PO TABS: ORAL_TABLET | ORAL | 3 refills | Status: DC

## 2021-01-18 NOTE — Progress Notes (Signed)
Paul Hardy - 60 y.o. male MRN [id number]  Date of birth: 1960/12/04  Subjective Chief Complaint  Patient presents with   Shortness of Breath   Mood    HPI Paul Hardy is a 60 year old male here today for hospital follow-up.  Since his last visit with me he was readmitted to the hospital for NSTEMI.  He underwent catheterization showing thrombosis of saphenous vein graft.  He had aspiration of thrombus and balloon angioplasty.  Repeat echocardiogram showed ischemic cardiomyopathy with an EF of 40 to 45% and akinesis.  He was switched to Brilinta prior to discharge.  He has had follow-up with cardiology since discharge.  He feels that Brilinta makes him feel bad.  He has also started cardiac rehab.  He is tolerating cardiac rehab well so far.  He did have another episode of chest pain and was seen in the ER approximately 1 week ago.  Cardiac enzymes were negative at that time.  He does continue to endorse significant anxiety.  He does use alprazolam as needed however feels that he needs to be on something daily for anxiety.  His wife is noted that he has also been more irritable recently.  ROS:  A comprehensive ROS was completed and negative except as noted per HPI  Allergies  Allergen Reactions   Bee Venom Anaphylaxis   Iodinated Diagnostic Agents Anaphylaxis    And cardiac arrest   Hydrochlorothiazide Other (See Comments)    Dehydration and muscle spasms. Lasix does the same   Lisinopril Cough    Losartan is OK    History reviewed. No pertinent past medical history.  Past Surgical History:  Procedure Laterality Date   CHOLECYSTECTOMY, LAPAROSCOPIC     VASECTOMY      Social History   Socioeconomic History   Marital status: Married    Spouse name: Not on file   Number of children: Not on file   Years of education: Not on file   Highest education level: Not on file  Occupational History   Not on file  Tobacco Use   Smoking status: Every Day    Packs/day: 1.00    Years: 20.00     Pack years: 20.00    Types: Cigarettes    Start date: 02/21/1998    Passive exposure: Never   Smokeless tobacco: Never  Vaping Use   Vaping Use: Never used  Substance and Sexual Activity   Alcohol use: Never   Drug use: Never   Sexual activity: Yes    Partners: Female  Other Topics Concern   Not on file  Social History Narrative   Not on file   Social Determinants of Health   Financial Resource Strain: Not on file  Food Insecurity: Not on file  Transportation Needs: Not on file  Physical Activity: Not on file  Stress: Not on file  Social Connections: Not on file    History reviewed. No pertinent family history.  Health Maintenance  Topic Date Due   Pneumococcal Vaccine 77-65 Years old (1 - PCV) Never done   HIV Screening  Never done   Hepatitis C Screening  Never done   COVID-19 Vaccine (5 - Booster for Moderna series) 09/26/2020   Fecal DNA (Cologuard)  11/04/2023   TETANUS/TDAP  06/07/2029   INFLUENZA VACCINE  Completed   Zoster Vaccines- Shingrix  Completed   HPV VACCINES  Aged Out     ----------------------------------------------------------------------------------------------------------------------------------------------------------------------------------------------------------------- Physical Exam BP 118/76 (BP Location: Left Arm, Patient Position: Sitting, Cuff Size: Normal)   Pulse  78   Temp 97.7 F (36.5 C)   Ht 5\' 11"  (1.803 m)   Wt 195 lb (88.5 kg)   SpO2 100%   BMI 27.20 kg/m   Physical Exam Constitutional:      Appearance: Normal appearance. He is well-developed.  Eyes:     General: No scleral icterus. Cardiovascular:     Rate and Rhythm: Normal rate and regular rhythm.  Pulmonary:     Effort: Pulmonary effort is normal.     Breath sounds: Normal breath sounds.  Neurological:     General: No focal deficit present.     Mental Status: He is alert.  Psychiatric:        Mood and Affect: Mood normal.        Behavior: Behavior  normal.    ------------------------------------------------------------------------------------------------------------------------------------------------------------------------------------------------------------------- Assessment and Plan  Adjustment disorder Adjustment disorder with anxiety.  Adding Lexapro 5 mg initially x1 week with increase to 10 mg thereafter.  Potential side effects reviewed with him.  He may continue alprazolam as needed for now with goal of weaning him from this once Lexapro is working well.  Essential hypertension Blood pressure remains well controlled at this time.  Continue current medication.  Ischemic cardiomyopathy History of STEMI, 2 months ago with NSTEMI approximately 1 month ago.  Pain recent EF of 40 to 45%.  Currently undergoing cardiac rehab.  He will continue management per cardiology.   Meds ordered this encounter  Medications   escitalopram (LEXAPRO) 10 MG tablet    Sig: Take 5mg  po daily x1 week then increase to 10mg  daily    Dispense:  30 tablet    Refill:  3    Return in about 5 weeks (around 02/22/2021) for Anxiety.    This visit occurred during the SARS-CoV-2 public health emergency.  Safety protocols were in place, including screening questions prior to the visit, additional usage of staff PPE, and extensive cleaning of exam room while observing appropriate contact time as indicated for disinfecting solutions.

## 2021-01-18 NOTE — Assessment & Plan Note (Signed)
Blood pressure remains well controlled at this time.  Continue current medication.

## 2021-01-18 NOTE — Assessment & Plan Note (Signed)
Adjustment disorder with anxiety.  Adding Lexapro 5 mg initially x1 week with increase to 10 mg thereafter.  Potential side effects reviewed with him.  He may continue alprazolam as needed for now with goal of weaning him from this once Lexapro is working well.

## 2021-01-18 NOTE — Patient Instructions (Addendum)
Start 1/2 tab daily for the first week then increase to a full tab See me again in 4-6 weeks

## 2021-01-18 NOTE — Telephone Encounter (Signed)
Transition Care Management Unsuccessful Follow-up Telephone Call  Date of discharge and from where:  01/11/21 from [facility]  Attempts:  3rd Attempt  Reason for unsuccessful TCM follow-up call:  No answer/busy Has an OV scheduled for today with Dr. Karie Schwalbe.

## 2021-01-18 NOTE — Assessment & Plan Note (Signed)
History of STEMI, 2 months ago with NSTEMI approximately 1 month ago.  Pain recent EF of 40 to 45%.  Currently undergoing cardiac rehab.  He will continue management per cardiology.

## 2021-01-21 ENCOUNTER — Telehealth: Payer: Self-pay | Admitting: Pulmonary Disease

## 2021-01-21 NOTE — Telephone Encounter (Signed)
Called and spoke with Patient's Wife Lurena Joiner (DPR).  Lurena Joiner asked if cxr was needed before pulmonary consult.  Aida Raider and Patient that at consult additional test may be advised, and cxr would not be needed before visit.  Patient has recent cxr available in PACS.  Understanding stated.  Nothing further at this time.

## 2021-01-22 ENCOUNTER — Encounter: Payer: Self-pay | Admitting: Pulmonary Disease

## 2021-01-22 ENCOUNTER — Ambulatory Visit (INDEPENDENT_AMBULATORY_CARE_PROVIDER_SITE_OTHER): Payer: No Typology Code available for payment source | Admitting: Pulmonary Disease

## 2021-01-22 ENCOUNTER — Other Ambulatory Visit: Payer: Self-pay

## 2021-01-22 VITALS — BP 118/76 | HR 82 | Temp 97.7°F | Ht 73.0 in | Wt 198.2 lb

## 2021-01-22 DIAGNOSIS — R0602 Shortness of breath: Secondary | ICD-10-CM

## 2021-01-22 NOTE — Progress Notes (Signed)
Paul Hardy    EE:[id number]    September 18, 1960  Primary Care Physician:Matthews, Einar Pheasant, DO  Referring Physician: Luetta Nutting, [address] [place],  [place] [address]  Chief complaint: Consult for dyspnea  HPI: 60 year old ex-smoker with coronary artery disease Suffered cardiac arrest in October 2022 requiring triple bypass surgery.  He has had intermittent shortness of breath since then and was told that this is secondary to Brilinta.  He has had a pulmonary evaluation including PFTs which show some restriction and diffusion impairment.  Chest x-ray with basilar atelectasis with a VQ scan at [facility] ED with low probability of pulmonary embolism.  He also has sleep study scheduled for end of December for suspicion of sleep apnea  Pets: Has dogs, cats Occupation: Used to work as a Airline pilot, Audiological scientist.  Currently a truck driver Exposures: No mold, hot tub, Jacuzzi.  No feather pillows or comforter Smoking history: 60-pack-year smoker.  Quit in October 2022 Travel history: No significant travel history Relevant family history: No family history of lung disease  Outpatient Encounter Medications as of 01/22/2021  Medication Sig   albuterol (VENTOLIN HFA) 108 (90 Base) MCG/ACT inhaler Inhale into the lungs.   ALPRAZolam (XANAX) 0.5 MG tablet Take 0.5-1 tablets (0.25-0.5 mg total) by mouth 2 (two) times daily as needed for anxiety.   AMBULATORY NON FORMULARY MEDICATION Please provide incentive spirometer.  Use every 2 hours.  Dx: Post-operative state/CABG Z95.1   AMBULATORY NON FORMULARY MEDICATION Please provide rollator walker.  Diagnosis: Gait instability, CABG R26.81, Z95.1   aspirin 81 MG EC tablet Take by mouth.   atorvastatin (LIPITOR) 80 MG tablet Take 80 mg by mouth at bedtime.   EPINEPHrine 0.3 mg/0.3 mL IJ SOAJ injection Inject 0.3 mg into the muscle as needed for anaphylaxis (bee sting).   escitalopram (LEXAPRO) 10 MG tablet Take 5mg  po daily  x1 week then increase to 10mg  daily   furosemide (LASIX) 40 MG tablet Take 40 mg by mouth daily.   KLOR-CON M20 20 MEQ tablet Take 20 mEq by mouth daily.   losartan (COZAAR) 25 MG tablet Take by mouth.   metoprolol tartrate (LOPRESSOR) 25 MG tablet Take 25 mg by mouth 2 (two) times daily.   Multiple Vitamin (MULTIVITAMIN) tablet Take 1 tablet by mouth daily.   nitroGLYCERIN (NITROSTAT) 0.4 MG SL tablet Place under the tongue.   omeprazole-sodium bicarbonate (ZEGERID) 40-1100 MG capsule Take by mouth.   ondansetron (ZOFRAN ODT) 4 MG disintegrating tablet Take 1 tablet (4 mg total) by mouth every 8 (eight) hours as needed for nausea or vomiting.   oxyCODONE (OXY IR/ROXICODONE) 5 MG immediate release tablet Take 5 mg by mouth every 8 (eight) hours as needed.   sildenafil (REVATIO) 20 MG tablet Take as few as 2 and as many as 5 by mouth at least one hour prior to sex   [START ON 01/23/2021] ticagrelor (BRILINTA) 90 MG TABS tablet Take by mouth.   No facility-administered encounter medications on file as of 01/22/2021.    Allergies as of 01/22/2021 - Review Complete 01/22/2021  Allergen Reaction Noted   Bee venom Anaphylaxis 02/02/2020   Iodinated diagnostic agents Anaphylaxis 08/27/2010   Hydrochlorothiazide Other (See Comments) 08/27/2010   Lisinopril Cough 08/27/2010    No past medical history on file.  Past Surgical History:  Procedure Laterality Date   CHOLECYSTECTOMY, LAPAROSCOPIC     VASECTOMY      No family history on file.  Social History   Socioeconomic History   Marital status: Married    Spouse name: Not on file   Number of children: Not on file   Years of education: Not on file   Highest education level: Not on file  Occupational History   Not on file  Tobacco Use   Smoking status: Former    Packs/day: 1.00    Years: 20.00    Pack years: 20.00    Types: Cigarettes    Start date: 02/21/1998    Quit date: 11/24/2020    Years since quitting: 0.1    Passive  exposure: Never   Smokeless tobacco: Never  Vaping Use   Vaping Use: Never used  Substance and Sexual Activity   Alcohol use: Never   Drug use: Never   Sexual activity: Yes    Partners: Female  Other Topics Concern   Not on file  Social History Narrative   Not on file   Social Determinants of Health   Financial Resource Strain: Not on file  Food Insecurity: Not on file  Transportation Needs: Not on file  Physical Activity: Not on file  Stress: Not on file  Social Connections: Not on file  Intimate Partner Violence: Not on file    Review of systems: Review of Systems  Constitutional: Negative for fever and chills.  HENT: Negative.   Eyes: Negative for blurred vision.  Respiratory: as per HPI  Cardiovascular: Negative for chest pain and palpitations.  Gastrointestinal: Negative for vomiting, diarrhea, blood per rectum. Genitourinary: Negative for dysuria, urgency, frequency and hematuria.  Musculoskeletal: Negative for myalgias, back pain and joint pain.  Skin: Negative for itching and rash.  Neurological: Negative for dizziness, tremors, focal weakness, seizures and loss of consciousness.  Endo/Heme/Allergies: Negative for environmental allergies.  Psychiatric/Behavioral: Negative for depression, suicidal ideas and hallucinations.  All other systems reviewed and are negative.  Physical Exam: Blood pressure 118/76, pulse 82, temperature 97.7 F (36.5 C), temperature source Oral, height 6\' 1"  (1.854 m), weight 198 lb 3.2 oz (89.9 kg), SpO2 99 %. Gen:      No acute distress HEENT:  EOMI, sclera anicteric Neck:     No masses; no thyromegaly Lungs:    Clear to auscultation bilaterally; normal respiratory effort CV:         Regular rate and rhythm; no murmurs Abd:      + bowel sounds; soft, non-tender; no palpable masses, no distension Ext:    No edema; adequate peripheral perfusion Skin:      Warm and dry; no rash Neuro: alert and oriented x 3 Psych: normal mood and  affect  Data Reviewed: Imaging: Chest x-ray 01/06/2021-no acute cardiopulmonary abnormality Chest x-ray 01/11/2021-bibasal atelectasis VQ scan 01/11/2021-low probability for pulmonary embolism I have reviewed the images personally  PFTs: PFTs from [facility] 12/28/2020 The FEV1 and FVC are moderately reduced and suggest a restrictive lung defect. The TLC is mildly reduced. There is a mild reduction of the diffusing capacity  Labs: 01/11/2021 from [place] D-dimer -0.76 proBNP 479 Troponin -92 CMP normal except for creatinine of 1.3 CBC WNL  Assessment:  Assessment for dyspnea Has intermittent shortness of breath which appears to have come on after recent heart attack and CABG Prior PFTs reviewed with restriction and mild diffusion impairment with basilar atelectasis  CT reviewed with basal atelectasis and no significant lung abnormalities chest x-ray from [facility].  He is also assessed for PE with low probability VQ scan We will get repeat PFTs and CT for  evaluation of the lung  Suspected sleep apnea Sleep study already scheduled.  We will review this when available  Plan/Recommendations: High-res CT, PFTs  Chilton Greathouse MD [facility] 01/22/2021, 4:07 PM  CC: Everrett Coombe, DO

## 2021-01-22 NOTE — Patient Instructions (Addendum)
We will schedule high-res CT and PFTs for better evaluation of your lungs Please send me the results of your upcoming sleep study Follow-up in clinic after these tests for review and plan for next steps

## 2021-02-03 DIAGNOSIS — E785 Hyperlipidemia, unspecified: Secondary | ICD-10-CM | POA: Insufficient documentation

## 2021-02-04 ENCOUNTER — Other Ambulatory Visit: Payer: Self-pay

## 2021-02-04 ENCOUNTER — Ambulatory Visit (INDEPENDENT_AMBULATORY_CARE_PROVIDER_SITE_OTHER): Payer: No Typology Code available for payment source

## 2021-02-04 DIAGNOSIS — R0602 Shortness of breath: Secondary | ICD-10-CM | POA: Diagnosis not present

## 2021-02-09 ENCOUNTER — Telehealth: Payer: Self-pay | Admitting: Pulmonary Disease

## 2021-02-09 ENCOUNTER — Other Ambulatory Visit: Payer: Self-pay | Admitting: Family Medicine

## 2021-02-09 NOTE — Telephone Encounter (Signed)
Call made to patient, confirmed DOB. Patient made aware of CT results per PM. Voiced understanding. Confirmed f/u appt date and time. Voiced understanding.   Nothing further needed at this time.

## 2021-02-16 ENCOUNTER — Telehealth: Payer: Self-pay | Admitting: Pulmonary Disease

## 2021-02-17 NOTE — Telephone Encounter (Signed)
Tried calling the pt and there was no answer- LMTCB.  

## 2021-02-25 ENCOUNTER — Other Ambulatory Visit: Payer: Self-pay

## 2021-02-25 ENCOUNTER — Encounter: Payer: Self-pay | Admitting: Family Medicine

## 2021-02-25 ENCOUNTER — Ambulatory Visit (INDEPENDENT_AMBULATORY_CARE_PROVIDER_SITE_OTHER): Payer: No Typology Code available for payment source | Admitting: Family Medicine

## 2021-02-25 DIAGNOSIS — I255 Ischemic cardiomyopathy: Secondary | ICD-10-CM | POA: Diagnosis not present

## 2021-02-25 DIAGNOSIS — F4322 Adjustment disorder with anxiety: Secondary | ICD-10-CM | POA: Diagnosis not present

## 2021-02-25 NOTE — Assessment & Plan Note (Addendum)
Stable current medications.  Currently doing cardiac rehab and tolerating fairly well.  He will continue to see cardiology on a regular basis.

## 2021-02-25 NOTE — Assessment & Plan Note (Signed)
So far he is doing well with Lexapro.  Recommend continuation.

## 2021-02-25 NOTE — Progress Notes (Signed)
Paul Hardy - 61 y.o. male MRN [id number]  Date of birth: [birth year]  Subjective Chief Complaint  Patient presents with   Dizziness    HPI Paul Hardy is a 61 year old male here today for follow-up visit.  Started on Lexapro previously for anxiety and reports that this seems to be working pretty well for him.  He has had some decrease in his anxiety and irritability since starting this.  He has not noted any significant side effects from this.\  Currently participating in cardiac rehab.  This is all fairly well for him.  He continues to have some fatigue and generalized weakness.  He is awaiting follow-up with pulmonology for sleep study as well.  ROS:  A comprehensive ROS was completed and negative except as noted per HPI  Allergies  Allergen Reactions   Bee Venom Anaphylaxis   Iodinated Contrast Media Anaphylaxis    And cardiac arrest   Hydrochlorothiazide Other (See Comments)    Dehydration and muscle spasms. Lasix does the same   Lisinopril Cough    Losartan is OK    History reviewed. No pertinent past medical history.  Past Surgical History:  Procedure Laterality Date   CHOLECYSTECTOMY, LAPAROSCOPIC     VASECTOMY      Social History   Socioeconomic History   Marital status: Married    Spouse name: Not on file   Number of children: Not on file   Years of education: Not on file   Highest education level: Not on file  Occupational History   Not on file  Tobacco Use   Smoking status: Former    Packs/day: 1.00    Years: 20.00    Pack years: 20.00    Types: Cigarettes    Start date: 02/21/1998    Quit date: 11/24/2020    Years since quitting: 0.2    Passive exposure: Never   Smokeless tobacco: Never  Vaping Use   Vaping Use: Never used  Substance and Sexual Activity   Alcohol use: Never   Drug use: Never   Sexual activity: Yes    Partners: Female  Other Topics Concern   Not on file  Social History Narrative   Not on file   Social Determinants of Health    Financial Resource Strain: Not on file  Food Insecurity: Not on file  Transportation Needs: Not on file  Physical Activity: Not on file  Stress: Not on file  Social Connections: Not on file    History reviewed. No pertinent family history.  Health Maintenance  Topic Date Due   HIV Screening  Never done   Hepatitis C Screening  Never done   COVID-19 Vaccine (5 - Booster for Moderna series) 09/26/2020   Fecal DNA (Cologuard)  11/04/2023   TETANUS/TDAP  06/07/2029   INFLUENZA VACCINE  Completed   Zoster Vaccines- Shingrix  Completed   Pneumococcal Vaccine 70-19 Years old  Aged Out   HPV VACCINES  Aged Out     ----------------------------------------------------------------------------------------------------------------------------------------------------------------------------------------------------------------- Physical Exam BP 105/74 (BP Location: Left Arm)    Pulse 62    Temp 97.7 F (36.5 C)    Wt 198 lb (89.8 kg)    SpO2 99%    BMI 26.12 kg/m   Physical Exam Constitutional:      Appearance: Normal appearance.  Eyes:     General: No scleral icterus. Cardiovascular:     Rate and Rhythm: Normal rate and regular rhythm.  Pulmonary:     Effort: Pulmonary effort is normal.  Breath sounds: Normal breath sounds.  Musculoskeletal:     Cervical back: Neck supple.  Neurological:     Mental Status: He is alert.  Psychiatric:        Mood and Affect: Mood normal.        Behavior: Behavior normal.    ------------------------------------------------------------------------------------------------------------------------------------------------------------------------------------------------------------------- Assessment and Plan  Ischemic cardiomyopathy Stable current medications.  Currently doing cardiac rehab and tolerating fairly well.  He will continue to see cardiology on a regular basis.  Adjustment disorder So far he is doing well with Lexapro.  Recommend  continuation.   No orders of the defined types were placed in this encounter.   Return in about 4 months (around 06/25/2021) for Anxiety.    This visit occurred during the SARS-CoV-2 public health emergency.  Safety protocols were in place, including screening questions prior to the visit, additional usage of staff PPE, and extensive cleaning of exam room while observing appropriate contact time as indicated for disinfecting solutions.

## 2021-03-03 ENCOUNTER — Telehealth: Payer: Self-pay | Admitting: General Practice

## 2021-03-03 NOTE — Telephone Encounter (Signed)
Transition Care Management Follow-up Telephone Call Date of discharge and from where: 03/02/21 from [facility] How have you been since you were released from the hospital? Still having breathing difficulty and feeling lethargic but overall feeling better. He has a pulse oximeter at home and his oxygen levels are within normal limits. He feels this is coming from one of his heart medicines and has an appointment with the cardiologist on 03/08/21. Any questions or concerns? No  Items Reviewed: Did the pt receive and understand the discharge instructions provided? Yes  Medications obtained and verified? Yes  Other? No  Any new allergies since your discharge? No  Dietary orders reviewed? Yes Do you have support at home? Yes   Home Care and Equipment/Supplies: Were home health services ordered? no  Functional Questionnaire: (I = Independent and D = Dependent) ADLs: I  Bathing/Dressing- I  Meal Prep- I  Eating- I  Maintaining continence- I  Transferring/Ambulation- I  Managing Meds- I  Follow up appointments reviewed:  PCP Hospital f/u appt confirmed? No  Specialist Hospital f/u appt confirmed? Yes  Scheduled to see cardiologist on 03/08/21. Are transportation arrangements needed? No  If their condition worsens, is the pt aware to call PCP or go to the Emergency Dept.? Yes Was the patient provided with contact information for the PCP's office or ED? Yes Was to pt encouraged to call back with questions or concerns? Yes

## 2021-03-16 ENCOUNTER — Ambulatory Visit (INDEPENDENT_AMBULATORY_CARE_PROVIDER_SITE_OTHER): Payer: No Typology Code available for payment source | Admitting: Pulmonary Disease

## 2021-03-16 ENCOUNTER — Other Ambulatory Visit: Payer: Self-pay | Admitting: Family Medicine

## 2021-03-16 ENCOUNTER — Other Ambulatory Visit: Payer: Self-pay

## 2021-03-16 ENCOUNTER — Encounter: Payer: Self-pay | Admitting: Pulmonary Disease

## 2021-03-16 VITALS — BP 120/64 | HR 63 | Temp 97.7°F | Ht 71.0 in | Wt 201.6 lb

## 2021-03-16 DIAGNOSIS — G4733 Obstructive sleep apnea (adult) (pediatric): Secondary | ICD-10-CM

## 2021-03-16 DIAGNOSIS — R0602 Shortness of breath: Secondary | ICD-10-CM

## 2021-03-16 LAB — PULMONARY FUNCTION TEST
DL/VA % pred: 77 %
DL/VA: 3.28 ml/min/mmHg/L
DLCO cor % pred: 59 %
DLCO cor: 16.92 ml/min/mmHg
DLCO unc % pred: 61 %
DLCO unc: 17.43 ml/min/mmHg
FEF 25-75 Post: 3.15 L/sec
FEF 25-75 Pre: 2.51 L/sec
FEF2575-%Change-Post: 25 %
FEF2575-%Pred-Post: 102 %
FEF2575-%Pred-Pre: 81 %
FEV1-%Change-Post: 7 %
FEV1-%Pred-Post: 69 %
FEV1-%Pred-Pre: 64 %
FEV1-Post: 2.61 L
FEV1-Pre: 2.42 L
FEV1FVC-%Change-Post: 1 %
FEV1FVC-%Pred-Pre: 107 %
FEV6-%Change-Post: 6 %
FEV6-%Pred-Post: 67 %
FEV6-%Pred-Pre: 63 %
FEV6-Post: 3.17 L
FEV6-Pre: 2.98 L
FEV6FVC-%Change-Post: 0 %
FEV6FVC-%Pred-Post: 104 %
FEV6FVC-%Pred-Pre: 104 %
FVC-%Change-Post: 6 %
FVC-%Pred-Post: 64 %
FVC-%Pred-Pre: 60 %
FVC-Post: 3.17 L
FVC-Pre: 2.98 L
Post FEV1/FVC ratio: 82 %
Post FEV6/FVC ratio: 100 %
Pre FEV1/FVC ratio: 81 %
Pre FEV6/FVC Ratio: 100 %
RV % pred: 103 %
RV: 2.38 L
TLC % pred: 76 %
TLC: 5.47 L

## 2021-03-16 NOTE — Patient Instructions (Signed)
We will order AutoSet CPAP 5-15 through [facility] I have reviewed the CT which does not show any significant lung abnormality Follow-up in 3 months

## 2021-03-16 NOTE — Progress Notes (Signed)
PFT done today. 

## 2021-03-16 NOTE — Progress Notes (Signed)
Paul Hardy    [id number]    [birth year]  Primary Care Physician:Matthews, Selena Batten, DO  Referring Physician: Everrett Coombe, DO [address] [facility] [address] [place],  [place] [address]  Chief complaint: Follow-up for dyspnea  HPI: 61 year old ex-smoker with coronary artery disease Suffered cardiac arrest in October 2022 requiring triple bypass surgery.  He has had intermittent shortness of breath since then and was told that this is secondary to Brilinta.  He has had a pulmonary evaluation including PFTs which show some restriction and diffusion impairment.  Chest x-ray with basilar atelectasis with a VQ scan at [facility] ED with low probability of pulmonary embolism.  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  No feather pillows or comforter Smoking history: 60-pack-year smoker.  Quit in October 2022 Travel history: No significant travel history Relevant family history: No family history of lung disease  Interim history: He is here for review of PFTs and CT scan. Follows up with cardiology at [facility] health with EF 40-45%.  At last visit on 03/08/2021 there were adjustments to his medication including stopping losartan and starting Entresto.  Also started on Lasix for heart failure.  Continues to have dyspnea on exertion, fatigue  Outpatient Encounter Medications as of 03/16/2021  Medication Sig   albuterol (VENTOLIN HFA) 108 (90 Base) MCG/ACT inhaler Inhale into the lungs.   AMBULATORY NON FORMULARY MEDICATION Please provide incentive spirometer.  Use every 2 hours.  Dx: Post-operative state/CABG Z95.1   AMBULATORY NON FORMULARY MEDICATION Please provide rollator walker.  Diagnosis: Gait instability, CABG R26.81, Z95.1   aspirin 81 MG EC tablet Take by mouth.   atorvastatin (LIPITOR) 80 MG  tablet Take 80 mg by mouth at bedtime.   EPINEPHrine 0.3 mg/0.3 mL IJ SOAJ injection Inject 0.3 mg into the muscle as needed for anaphylaxis (bee sting).   escitalopram (LEXAPRO) 10 MG tablet TAKE 1/2 TABLET BY MOUTH DAILY FOR 1 WEEK THEN INCREASE TO 1 TABLET DAILY   furosemide (LASIX) 40 MG tablet Take 40 mg by mouth daily.   isosorbide mononitrate (IMDUR) 30 MG 24 hr tablet Take 30 mg by mouth daily.   KLOR-CON M20 20 MEQ tablet Take 20 mEq by mouth daily.   losartan (COZAAR) 25 MG tablet Take by mouth.   metoprolol tartrate (LOPRESSOR) 25 MG tablet Take 25 mg by mouth 2 (two) times daily.   Multiple Vitamin (MULTIVITAMIN) tablet Take 1 tablet by mouth daily.   nitroGLYCERIN (NITROSTAT) 0.4 MG SL tablet Place under the tongue.   omeprazole-sodium bicarbonate (ZEGERID) 40-1100 MG capsule Take by mouth.   ondansetron (ZOFRAN ODT) 4 MG disintegrating tablet Take 1 tablet (4 mg total) by mouth every 8 (eight) hours as needed for nausea or vomiting.   oxyCODONE (OXY IR/ROXICODONE) 5 MG immediate release tablet Take 5 mg by mouth every 8 (eight) hours as needed.   sildenafil (REVATIO) 20 MG tablet Take as few as 2 and as many as 5 by mouth at least one hour prior to sex   ticagrelor (BRILINTA) 90 MG TABS tablet Take by mouth.   ALPRAZolam (XANAX) 0.5 MG tablet Take 0.5-1 tablets (0.25-0.5 mg total) by mouth 2 (two) times daily as needed for anxiety.   No facility-administered encounter medications on file as of 03/16/2021.  Physical Exam: Blood pressure 120/64, pulse 63, temperature 97.7 F (36.5 C), temperature source Oral, height 5\' 11"  (1.803 m), weight 201 lb 9.6 oz (91.4 kg), SpO2 98 %. Gen:      No acute distress HEENT:  EOMI, sclera anicteric Neck:     No masses; no thyromegaly Lungs:    Clear to auscultation bilaterally; normal respiratory effort CV:         Regular rate and rhythm; no murmurs Abd:      + bowel sounds; soft, non-tender; no palpable masses, no distension Ext:    No  edema; adequate peripheral perfusion Skin:      Warm and dry; no rash Neuro: alert and oriented x 3 Psych: normal mood and affect   Data Reviewed: Imaging: Chest x-ray 01/06/2021-no acute cardiopulmonary abnormality Chest x-ray 01/11/2021-bibasal atelectasis VQ scan 01/11/2021-low probability for pulmonary embolism High-resolution CT 02/04/2021-no interstitial lung disease or lung abnormalities I have reviewed the images personally  PFTs: PFTs from [facility] 12/28/2020 The FEV1 and FVC are moderately reduced and suggest a restrictive lung defect. The TLC is mildly reduced. There is a mild reduction of the diffusing capacity  03/16/2021 FVC 3.17 [64%], FEV1 2.61 [69%], TLC 5.47 [76%], DLCO 17.43 [61%] Mild restriction, moderate diffusion defect  Labs: 01/11/2021 from [place] D-dimer -0.76 proBNP 479 Troponin -92 CMP normal except for creatinine of 1.3 CBC WNL  Assessment:  Assessment for dyspnea Has intermittent shortness of breath which appears to have come on after recent heart attack and CABG CT with no interstitial lung abnormalities He is also assessed for PE with low probability VQ scan PFTs show restriction and diffusion impairment which I suspect is from heart failure and body habitus.  Suspected sleep apnea Reviewed sleep study from outside pulmonary clinic showing moderate sleep apnea Start AutoSet CPAP 5-15 through [facility]  Plan/Recommendations: Start AutoSet CPAP  [place] MD [facility] 03/16/2021, 11:55 AM  CC: 03/18/2021, DO

## 2021-03-17 ENCOUNTER — Encounter: Payer: Self-pay | Admitting: Pulmonary Disease

## 2021-04-01 ENCOUNTER — Other Ambulatory Visit: Payer: Self-pay

## 2021-04-01 ENCOUNTER — Emergency Department (INDEPENDENT_AMBULATORY_CARE_PROVIDER_SITE_OTHER)
Admission: EM | Admit: 2021-04-01 | Discharge: 2021-04-01 | Disposition: A | Discharge status: Home / Self Care | Payer: No Typology Code available for payment source | Source: Home / Self Care | Attending: Family Medicine | Admitting: Family Medicine

## 2021-04-01 ENCOUNTER — Encounter: Payer: Self-pay | Admitting: Emergency Medicine

## 2021-04-01 DIAGNOSIS — R0902 Hypoxemia: Secondary | ICD-10-CM

## 2021-04-01 DIAGNOSIS — R0602 Shortness of breath: Secondary | ICD-10-CM

## 2021-04-01 HISTORY — DX: Heart failure, unspecified: I50.9

## 2021-04-01 LAB — POC SARS CORONAVIRUS 2 AG -  ED: SARS Coronavirus 2 Ag: NEGATIVE

## 2021-04-01 NOTE — Discharge Instructions (Signed)
The drop in oxygen with walking is significant.  You need to call your cardiology office first thing in the morning with a message to your doctor.  See if there is a nurse to talk to Continue same medicines Stay at low activity level Measure oxygen at home Go to ER if worse overnight

## 2021-04-01 NOTE — ED Provider Notes (Addendum)
Paul Hardy CARE    CSN: [id number] Arrival date & time: 04/01/21  1620      History   Chief Complaint Chief Complaint  Patient presents with   Shortness of Breath    HPI Paul Hardy is a 61 y.o. male.   HPI  Patient's chart is reviewed.  He sees Luetta Nutting for PCP.  He goes to [facility] for cardiology.  He also has had recent cardiothoracic surgery and is engaged in cardiac rehab.  He has been having trouble with fatigue and shortness of breath ever since his cardiac procedures.  He has congestive heart failure with a decreased ejection fraction.  Recently started on long-acting nitrates and Lasix to improve his symptomatology. For the last couple of days he has been more short of breath, coughing, and runny nose.  His wife was sick with a virus.  COVID testing here is negative.  Wife states his weight has gone up.  He went to cardiac rehab today and upon arrival his oxygenation was only 89%.  They did an EKG which they stated was unchanged.  They told him to go to the ER.  He refused to go the emergency room so came here for evaluation.  Try to explain to the patient that hypoxia is a problem that is better evaluated in the emergency room, however after review of his chart it appears he has been having shortness of breath, exercise intolerance, and similar symptoms for some time.  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   Alcohol use 11/26/2020   Acute hyperglycemia 11/26/2020   STEMI (ST elevation myocardial infarction) ([facility]) 11/25/2020   Well adult exam 10/21/2020   ED (erectile dysfunction) 09/23/2020    Gastroesophageal reflux disease without esophagitis 09/23/2020   Chronic kidney disease (CKD), stage III (moderate) ([facility]) 09/24/2018   Hemochromatosis 09/24/2018   Essential hypertension 09/12/2016    Past Surgical History:  Procedure Laterality Date   CARDIAC SURGERY     CHOLECYSTECTOMY, LAPAROSCOPIC     VASECTOMY         Home Medications    Prior to Admission medications   Medication Sig Start Date End Date Taking? Authorizing Provider  albuterol (VENTOLIN HFA) 108 (90 Base) MCG/ACT inhaler Inhale into the lungs. 01/07/21 01/07/22  [provider]  AMBULATORY NON FORMULARY MEDICATION Please provide incentive spirometer.  Use every 2 hours.  Dx: Post-operative state/CABG Z95.1 12/04/20   Luetta Nutting, DO  AMBULATORY NON FORMULARY MEDICATION Please provide rollator walker.  Diagnosis: Gait instability, CABG R26.81, Z95.1 12/04/20   Luetta Nutting, DO  aspirin 81 MG EC tablet Take by mouth. 12/26/20 12/26/21  [provider]  atorvastatin (LIPITOR) 80 MG tablet Take 80 mg by mouth at bedtime. 01/13/21   [provider]  EPINEPHrine 0.3 mg/0.3 mL IJ SOAJ injection Inject 0.3 mg into the muscle as needed for anaphylaxis (bee sting). 09/23/20   Luetta Nutting, DO  escitalopram (LEXAPRO) 10 MG tablet TAKE 1/2 TABLET BY MOUTH DAILY FOR 1 WEEK THEN INCREASE TO 1 TABLET DAILY 02/09/21   Luetta Nutting, DO  furosemide (LASIX) 40 MG tablet Take  40 mg by mouth daily. 12/02/20   [provider]  isosorbide mononitrate (IMDUR) 30 MG 24 hr tablet Take 30 mg by mouth daily. 02/03/21   [provider]  KLOR-CON M20 20 MEQ tablet Take 20 mEq by mouth daily. 12/02/20   [provider]  metoprolol tartrate (LOPRESSOR) 25 MG tablet Take 25 mg by mouth 2 (two) times daily. 12/02/20   [provider]  Multiple Vitamin (MULTIVITAMIN) tablet Take 1 tablet by mouth daily.    [provider]  nitroGLYCERIN (NITROSTAT) 0.4 MG SL tablet Place  under the tongue. 12/25/20   [provider]  omeprazole-sodium bicarbonate (ZEGERID) 40-1100 MG capsule Take by mouth.    [provider]  ondansetron (ZOFRAN ODT) 4 MG disintegrating tablet Take 1 tablet (4 mg total) by mouth every 8 (eight) hours as needed for nausea or vomiting. 12/04/20   Luetta Nutting, DO  oxyCODONE (OXY IR/ROXICODONE) 5 MG immediate release tablet Take 5 mg by mouth every 8 (eight) hours as needed. 12/02/20   [provider]  sildenafil (REVATIO) 20 MG tablet Take as few as 2 and as many as 5 by mouth at least one hour prior to sex 10/13/20   Luetta Nutting, DO  ticagrelor (BRILINTA) 90 MG TABS tablet Take by mouth. 01/23/21   [provider]    Family History Family History  Problem Relation Age of Onset   Cancer Mother     Social History Social History   Tobacco Use   Smoking status: Former    Packs/day: 1.00    Years: 20.00    Pack years: 20.00    Types: Cigarettes    Start date: 02/21/1998    Quit date: 11/24/2020    Years since quitting: 0.3    Passive exposure: Never   Smokeless tobacco: Never  Vaping Use   Vaping Use: Never used  Substance Use Topics   Alcohol use: Yes   Drug use: Never     Allergies   Bee venom, Iodinated contrast media, Hydrochlorothiazide, and Lisinopril   Review of Systems Review of Systems See HPI  Physical Exam Triage Vital Signs ED Triage Vitals  Enc Vitals Group     BP 04/01/21 1632 108/79     Pulse Rate 04/01/21 1632 71     Resp 04/01/21 1632 18     Temp 04/01/21 1632 (!) 97.5 F (36.4 C)     Temp Source 04/01/21 1632 Oral     SpO2 04/01/21 1632 98 %     Weight 04/01/21 1635 200 lb (90.7 kg)     Height 04/01/21 1635 5\' 11"  (1.803 m)     Head Circumference --      Peak Flow --      Pain Score 04/01/21 1634 0     Pain Loc --      Pain Edu? --      Excl. in [place]? --    No data found.  Updated Vital Signs BP 108/79 (BP Location: Left Arm)    Pulse 63    Temp (!) 97.5 F  (36.4 C) (Oral)    Resp 18    Ht 5\' 11"  (1.803 m)    Wt 90.7 kg    SpO2 98%    BMI 27.89 kg/m  Patient was walked in the hall 4 laps around our clinic.  At first he tolerated walking at a moderate pace well, however he started to exhibit difficulty breathing and his oxygenation dropped to 88%.  The  walking was discontinued.  His oxygen promptly returned to the 90s    Physical Exam Constitutional:      General: He is not in acute distress.    Appearance: He is well-developed and normal weight.  HENT:     Head: Normocephalic and atraumatic.     Right Ear: Tympanic membrane and ear canal normal.     Left Ear: Tympanic membrane and ear canal normal.     Nose: Nose normal. No congestion.     Mouth/Throat:     Mouth: Mucous membranes are moist.     Pharynx: No posterior oropharyngeal erythema.  Eyes:     Conjunctiva/sclera: Conjunctivae normal.     Pupils: Pupils are equal, round, and reactive to light.  Cardiovascular:     Rate and Rhythm: Normal rate and regular rhythm.     Heart sounds: Normal heart sounds.     Comments: 1 ectopic beat in 60 seconds Pulmonary:     Effort: Pulmonary effort is normal. No respiratory distress.     Breath sounds: Normal breath sounds.  Abdominal:     General: Abdomen is flat. There is no distension.     Palpations: Abdomen is soft.  Musculoskeletal:        General: Normal range of motion.     Cervical back: Normal range of motion.     Right lower leg: No edema.     Left lower leg: No edema.  Lymphadenopathy:     Cervical: No cervical adenopathy.  Skin:    General: Skin is warm and dry.  Neurological:     General: No focal deficit present.     Mental Status: He is alert.  Psychiatric:        Mood and Affect: Mood normal.        Behavior: Behavior normal.     UC Treatments / Results  Labs (all labs ordered are listed, but only abnormal results are displayed) Labs Reviewed  POC SARS CORONAVIRUS 2 AG -  ED    EKG   Radiology No results  found.  Procedures Procedures (including critical care time)  Medications Ordered in UC Medications - No data to display  Initial Impression / Assessment and Plan / UC Course  I have reviewed the triage vital signs and the nursing notes.  Pertinent labs & imaging results that were available during my care of the patient were reviewed by me and considered in my medical decision making (see chart for details).     COVID test is negative.  Examination is unremarkable.  Patient does not have pedal edema or gallop indicating worsening heart failure although this is certainly a possibility given his exertional hypoxia.  He does not want to go to the emergency room even though this was advised to him.  His wife is here with him.  She states that they have a pulse oximeter at home, and oxygen at home.  She will monitor him tonight and if his oxygen drops are put him on oxygen and call 911.  Otherwise he will call his cardiologist first thing in the morning. Final Clinical Impressions(s) / UC Diagnoses   Final diagnoses:  Hypoxia  SOB (shortness of breath)     Discharge Instructions      The drop in oxygen with walking is significant.  You need to call your cardiology office first thing in the morning with a message to your doctor.  See if there is a nurse to talk to Continue same medicines Stay  at low activity level Measure oxygen at home Go to ER if worse overnight   ED Prescriptions   None    PDMP not reviewed this encounter.   Raylene Everts, MD 04/01/21 1736    Raylene Everts, MD 04/01/21 [phone]

## 2021-04-01 NOTE — ED Triage Notes (Addendum)
Patient was at Cardiac Rehab and O2 saturation was 89% so they did not do rehab and advised him to go to [facility] or ED. Feels SOB Bi-lateral ears feel clogged, runny nose, CHF dx'd last month.

## 2021-04-09 NOTE — Telephone Encounter (Signed)
Pt did have sleep study performed and this is able to be seen in pt's chart. Order for cpap was placed after pt's OV 03/16/21. Nothing further needed.

## 2021-04-22 ENCOUNTER — Telehealth: Payer: Self-pay | Admitting: Pulmonary Disease

## 2021-04-22 ENCOUNTER — Other Ambulatory Visit: Payer: Self-pay

## 2021-04-22 ENCOUNTER — Telehealth: Payer: Self-pay | Admitting: Family Medicine

## 2021-04-22 DIAGNOSIS — I255 Ischemic cardiomyopathy: Secondary | ICD-10-CM

## 2021-04-22 DIAGNOSIS — I10 Essential (primary) hypertension: Secondary | ICD-10-CM

## 2021-04-22 NOTE — Telephone Encounter (Signed)
Contacted Cardiology and received lab orders from both Cardiology and CardioThoracic offices. Pt stated he was scheduled to have a lipid panel as well. Labs have been ordered and sent to Dr. Zigmund Daniel for review. ?

## 2021-04-22 NOTE — Telephone Encounter (Signed)
Patient wife called in and stated they were advised by the patient Cardiologist to call the PCP office in order to receive blood work done. Please advise.  ?

## 2021-04-22 NOTE — Progress Notes (Signed)
Will order, but I'm not sure why cardiology could not order these labs.Marland KitchenMarland Kitchen

## 2021-04-23 ENCOUNTER — Other Ambulatory Visit: Payer: Self-pay | Admitting: Family Medicine

## 2021-04-23 DIAGNOSIS — I10 Essential (primary) hypertension: Secondary | ICD-10-CM

## 2021-04-23 NOTE — Telephone Encounter (Signed)
ATC patient in regards to concerns about CPAP, mouth guard and inspire. Will try again later.  ?

## 2021-04-24 LAB — CBC WITH DIFFERENTIAL/PLATELET
Absolute Monocytes: 782 cells/uL (ref 200–950)
Basophils Absolute: 64 cells/uL (ref 0–200)
Basophils Relative: 0.7 %
Eosinophils Absolute: 202 cells/uL (ref 15–500)
Eosinophils Relative: 2.2 %
HCT: 47.3 % (ref 38.5–50.0)
Hemoglobin: 15.5 g/dL (ref 13.2–17.1)
Lymphs Abs: 3128 cells/uL (ref 850–3900)
MCH: 28.9 pg (ref 27.0–33.0)
MCHC: 32.8 g/dL (ref 32.0–36.0)
MCV: 88.1 fL (ref 80.0–100.0)
MPV: 9.7 fL (ref 7.5–12.5)
Monocytes Relative: 8.5 %
Neutro Abs: 5023 cells/uL (ref 1500–7800)
Neutrophils Relative %: 54.6 %
Platelets: 230 10*3/uL (ref 140–400)
RBC: 5.37 10*6/uL (ref 4.20–5.80)
RDW: 12.4 % (ref 11.0–15.0)
Total Lymphocyte: 34 %
WBC: 9.2 10*3/uL (ref 3.8–10.8)

## 2021-04-24 LAB — LIPID PANEL
Cholesterol: 126 mg/dL (ref <200)
HDL: 32 mg/dL — ABNORMAL LOW (ref >=40)
LDL Cholesterol (Calc): 64 mg/dL (calc)
Non-HDL Cholesterol (Calc): 94 mg/dL (calc) (ref <130)
Total CHOL/HDL Ratio: 3.9 (calc) (ref <5.0)
Triglycerides: 241 mg/dL — ABNORMAL HIGH (ref <150)

## 2021-04-24 LAB — BRAIN NATRIURETIC PEPTIDE: Brain Natriuretic Peptide: 24 pg/mL (ref <100)

## 2021-04-24 LAB — BASIC METABOLIC PANEL
BUN/Creatinine Ratio: 15 (calc) (ref 6–22)
BUN: 21 mg/dL (ref 7–25)
CO2: 28 mmol/L (ref 20–32)
Calcium: 9.1 mg/dL (ref 8.6–10.3)
Chloride: 102 mmol/L (ref 98–110)
Creat: 1.39 mg/dL — ABNORMAL HIGH (ref 0.70–1.35)
Glucose, Bld: 116 mg/dL — ABNORMAL HIGH (ref 65–99)
Potassium: 4.4 mmol/L (ref 3.5–5.3)
Sodium: 139 mmol/L (ref 135–146)

## 2021-06-15 ENCOUNTER — Ambulatory Visit: Payer: No Typology Code available for payment source | Admitting: Pulmonary Disease

## 2021-06-16 ENCOUNTER — Ambulatory Visit (INDEPENDENT_AMBULATORY_CARE_PROVIDER_SITE_OTHER): Payer: No Typology Code available for payment source | Admitting: Pulmonary Disease

## 2021-06-16 ENCOUNTER — Encounter: Payer: Self-pay | Admitting: Pulmonary Disease

## 2021-06-16 VITALS — BP 120/64 | HR 75 | Temp 97.5°F | Ht 73.0 in | Wt 214.6 lb

## 2021-06-16 DIAGNOSIS — R0602 Shortness of breath: Secondary | ICD-10-CM | POA: Diagnosis not present

## 2021-06-16 DIAGNOSIS — G4733 Obstructive sleep apnea (adult) (pediatric): Secondary | ICD-10-CM

## 2021-06-16 MED ORDER — BUDESONIDE-FORMOTEROL FUMARATE 160-4.5 MCG/ACT IN AERO: 2.0000 | INHALATION_SPRAY | Freq: Two times a day (BID) | RESPIRATORY_TRACT | 5 refills | Status: DC

## 2021-06-16 NOTE — Patient Instructions (Signed)
We will start you on inhaler called Symbicort 160/4.5.  Take 2 puffs twice daily ?Start the CPAP machine when able to get it ? ?Follow-up in 6 months ?

## 2021-06-16 NOTE — Progress Notes (Signed)
? ?      Paul Hardy    [id number]    March 13, 1960 ? ?Primary Care Physician:Matthews, Selena Batten, DO ? ?Referring Physician: Everrett Coombe, DO ?[address] [place]  ?[address] ?[place],  [place] [address] ? ?Chief complaint: Follow-up for dyspnea ? ?HPI: ?61 year old ex-smoker with coronary artery disease ?Suffered cardiac arrest in October 2022 requiring triple bypass surgery.  He has had intermittent shortness of breath since then and was told that this is secondary to Brilinta.  He has had a pulmonary evaluation including PFTs which show some restriction and diffusion impairment.  Chest x-ray with basilar atelectasis with a VQ scan at [facility] ED with low probability of pulmonary embolism. ? ?He also has sleep study scheduled for end of December for suspicion of sleep apnea ? 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  No feather pillows or comforter ?Smoking history: 60-pack-year smoker.  Quit in October 2022 ?Travel history: No significant travel history ?Relevant family history: No family history of lung disease ? ?Interim history: ?Continues follow-up with cardiology at [facility] health for ischemic cardiomyopathy with EF 40-45%.  He recently had a heart catheterization echo and continues medical management. ? ?Continues to have intermittent shortness of breath with wheezing ?He has been ordered CPAP at last visit but has not received it yet.  He is due to get his machine delivered this week. ? ?Outpatient Encounter Medications as of 06/16/2021  ?Medication Sig  ? albuterol (VENTOLIN HFA) 108 (90 Base) MCG/ACT inhaler Inhale into the lungs.  ? AMBULATORY NON FORMULARY MEDICATION Please provide incentive spirometer.  Use every 2 hours.  Dx: Post-operative state/CABG Z95.1  ? AMBULATORY NON FORMULARY MEDICATION Please provide rollator walker.  Diagnosis: Gait instability, CABG R26.81, Z95.1  ? aspirin 81 MG EC tablet  Take by mouth.  ? atorvastatin (LIPITOR) 80 MG tablet Take 80 mg by mouth at bedtime.  ? escitalopram (LEXAPRO) 10 MG tablet TAKE 1/2 TABLET BY MOUTH DAILY FOR 1 WEEK THEN INCREASE TO 1 TABLET DAILY  ? furosemide (LASIX) 40 MG tablet Take 40 mg by mouth daily.  ? isosorbide mononitrate (IMDUR) 30 MG 24 hr tablet Take 30 mg by mouth daily.  ? KLOR-CON M20 20 MEQ tablet Take 20 mEq by mouth daily.  ? metoprolol tartrate (LOPRESSOR) 25 MG tablet Take 25 mg by mouth 2 (two) times daily.  ? Multiple Vitamin (MULTIVITAMIN) tablet Take 1 tablet by mouth daily.  ? nitroGLYCERIN (NITROSTAT) 0.4 MG SL tablet Place under the tongue.  ? omeprazole-sodium bicarbonate (ZEGERID) 40-1100 MG capsule Take by mouth.  ? ondansetron (ZOFRAN ODT) 4 MG disintegrating tablet Take 1 tablet (4 mg total) by mouth every 8 (eight) hours as needed for nausea or vomiting.  ? ranolazine (RANEXA) 500 MG 12 hr tablet Take 500 mg by mouth 2 (two) times daily.  ? sildenafil (REVATIO) 20 MG tablet Take as few as 2 and as many as 5 by mouth at least one hour prior to sex  ? ticagrelor (BRILINTA) 90 MG TABS tablet Take by mouth.  ? [DISCONTINUED] EPINEPHrine 0.3 mg/0.3 mL IJ SOAJ injection Inject 0.3 mg into the muscle as needed for anaphylaxis (bee sting). (Patient not taking: Reported on 06/16/2021)  ? [DISCONTINUED] oxyCODONE (OXY IR/ROXICODONE) 5 MG immediate release tablet Take 5 mg by mouth every 8 (eight) hours as needed.  ? ?No facility-administered encounter medications on file as of 06/16/2021.  ? ?Physical  Exam: ?Blood pressure 120/64, pulse 75, temperature (!) 97.5 ?F (36.4 ?C), temperature source Oral, height 6\' 1"  (1.854 m), weight 214 lb 9.6 oz (97.3 kg), SpO2 96 %. ?Gen:      No acute distress ?HEENT:  EOMI, sclera anicteric ?Neck:     No masses; no thyromegaly ?Lungs:    Clear to auscultation bilaterally; normal respiratory effort ?CV:         Regular rate and rhythm; no murmurs ?Abd:      + bowel sounds; soft, non-tender; no palpable  masses, no distension ?Ext:    No edema; adequate peripheral perfusion ?Skin:      Warm and dry; no rash ?Neuro: alert and oriented x 3 ?Psych: normal mood and affect  ? ?Data Reviewed: ?Imaging: ?Chest x-ray 01/06/2021-no acute cardiopulmonary abnormality ?Chest x-ray 01/11/2021-bibasal atelectasis ?VQ scan 01/11/2021-low probability for pulmonary embolism ?High-resolution CT 02/04/2021-no interstitial lung disease or lung abnormalities ?I have reviewed the images personally ? ?PFTs: ?PFTs from [facility] 12/28/2020 ?The FEV1 and FVC are moderately reduced and suggest a restrictive lung defect. The TLC is mildly reduced. There is a mild reduction of the diffusing capacity ? ?03/16/2021 ?FVC 3.17 [64%], FEV1 2.61 [69%], TLC 5.47 [76%], DLCO 17.43 [61%] ?Mild restriction, moderate diffusion defect ? ?Labs: ?01/11/2021 from [facility] ?D-dimer -0.76 ?proBNP 479 ?Troponin -92 ?CMP normal except for creatinine of 1.3 ?CBC WNL ? ?Assessment:  ?Assessment for dyspnea ?Has intermittent shortness of breath which appears to have come on after recent heart attack and CABG ?CT with no interstitial lung abnormalities ?He is also assessed for PE with low probability VQ scan ?PFTs show restriction and diffusion impairment which I suspect is from heart failure and body habitus. ? ?He does have occasional wheeze and allergies and may have reactive airway disease ?We will trial Symbicort inhaler to see if it helps with breathing ? ?Suspected sleep apnea ?Reviewed sleep study from outside pulmonary clinic showing moderate sleep apnea ?AutoSet CPAP 5-15 has been ordered through [facility] and he is due to start therapy this week ? ?Plan/Recommendations: ?Start AutoSet CPAP ?Symbicort 160/4.5 ? ?[place] MD ?[facility] ?06/16/2021, 4:27 PM ? ?CC: 06/18/2021, DO ? ?  ?

## 2021-06-28 ENCOUNTER — Ambulatory Visit (INDEPENDENT_AMBULATORY_CARE_PROVIDER_SITE_OTHER): Payer: No Typology Code available for payment source | Admitting: Family Medicine

## 2021-06-28 ENCOUNTER — Encounter: Payer: Self-pay | Admitting: Family Medicine

## 2021-06-28 DIAGNOSIS — I10 Essential (primary) hypertension: Secondary | ICD-10-CM

## 2021-06-28 DIAGNOSIS — I255 Ischemic cardiomyopathy: Secondary | ICD-10-CM | POA: Diagnosis not present

## 2021-06-28 DIAGNOSIS — F4322 Adjustment disorder with anxiety: Secondary | ICD-10-CM | POA: Diagnosis not present

## 2021-06-28 MED ORDER — ESCITALOPRAM OXALATE 20 MG PO TABS: ORAL_TABLET | ORAL | 0 refills | Status: DC

## 2021-06-28 NOTE — Assessment & Plan Note (Signed)
BP is doing well, recommend continuation of current medication.   ?

## 2021-06-28 NOTE — Patient Instructions (Signed)
Increase lexapro to 20mg  daily.  ?Follow up in 3 months or sooner if needed.  ?

## 2021-06-28 NOTE — Assessment & Plan Note (Signed)
Continues with cardiac rehab and med management at this time.  ?

## 2021-06-28 NOTE — Assessment & Plan Note (Signed)
Increasing lexapro to 20mg .   ?

## 2021-06-28 NOTE — Progress Notes (Signed)
?Paul Hardy - 61 y.o. male MRN [id number]  Date of birth: 26-Jul-1960 ? ?Subjective ?No chief complaint on file. ? ? ?HPI ?Paul Hardy is a 61 y.o. male here today for follow up visit.   ? ?Continues to have dyspnea.  This is worse with exertion but does seem to have dyspnea a rest as well.  He has not had new chest pain, cough, wheezing, fever, chills, nausea or vomiting.  He doesn't feel that inhalers are very effective.  He is seeing pulmonology.  Recent dx of OSA as well.  Naal pillows sidn't work well.  He would be willing to try another mask.  ? ?His blood pressure remains well controlled.  Tolerating imdur ?and metoprolol. ? ?He is having difficulty with adjusting to not working.   Lexapro has helped some but he continues to have breakthrough anxiety.   ? ?ROS:  A comprehensive ROS was completed and negative except as noted per HPI ? ?Allergies  ?Allergen Reactions  ? Bee Venom Anaphylaxis  ? Iodinated Contrast Media Anaphylaxis  ?  And cardiac arrest  ? Hydrochlorothiazide Other (See Comments)  ?  Dehydration and muscle spasms. Lasix does the same  ? Lisinopril Cough  ?  Losartan is OK  ? ? ?Past Medical History:  ?Diagnosis Date  ? CHF (congestive heart failure) ([facility])   ? ? ?Past Surgical History:  ?Procedure Laterality Date  ? CARDIAC SURGERY    ? CHOLECYSTECTOMY, LAPAROSCOPIC    ? VASECTOMY    ? ? ?Social History  ? ?Socioeconomic History  ? Marital status: Married  ?  Spouse name: Not on file  ? Number of children: Not on file  ? Years of education: Not on file  ? Highest education level: Not on file  ?Occupational History  ? Not on file  ?Tobacco Use  ? Smoking status: Former  ?  Packs/day: 1.00  ?  Years: 20.00  ?  Pack years: 20.00  ?  Types: Cigarettes  ?  Start date: 02/21/1998  ?  Quit date: 11/24/2020  ?  Years since quitting: 0.5  ?  Passive exposure: Never  ? Smokeless tobacco: Never  ?Vaping Use  ? Vaping Use: Never used  ?Substance and Sexual Activity  ? Alcohol use: Yes  ? Drug use: Never  ?  Sexual activity: Yes  ?  Partners: Female  ?Other Topics Concern  ? Not on file  ?Social History Narrative  ? Not on file  ? ?Social Determinants of Health  ? ?Financial Resource Strain: Not on file  ?Food Insecurity: Not on file  ?Transportation Needs: Not on file  ?Physical Activity: Not on file  ?Stress: Not on file  ?Social Connections: Not on file  ? ? ?Family History  ?Problem Relation Age of Onset  ? Cancer Mother   ? ? ?Health Maintenance  ?Topic Date Due  ? COVID-19 Vaccine (5 - Booster for Moderna series) 07/14/2021 (Originally 09/26/2020)  ? Hepatitis C Screening  06/29/2022 (Originally 08/20/1978)  ? HIV Screening  06/29/2022 (Originally 08/20/1975)  ? INFLUENZA VACCINE  09/21/2021  ? Fecal DNA (Cologuard)  11/04/2023  ? TETANUS/TDAP  06/07/2029  ? Zoster Vaccines- Shingrix  Completed  ? HPV VACCINES  Aged Out  ? ? ? ?----------------------------------------------------------------------------------------------------------------------------------------------------------------------------------------------------------------- ?Physical Exam ?BP 111/68 (BP Location: Left Arm, Patient Position: Sitting, Cuff Size: Normal)   Pulse (!) 104   Ht 6\' 1"  (1.854 m)   Wt 211 lb (95.7 kg)   SpO2 97%   BMI 27.84 kg/m?  ? ?  Physical Exam ?Constitutional:   ?   Appearance: Normal appearance.  ?Eyes:  ?   General: No scleral icterus. ?Cardiovascular:  ?   Rate and Rhythm: Normal rate and regular rhythm.  ?Pulmonary:  ?   Effort: Pulmonary effort is normal.  ?   Breath sounds: Normal breath sounds.  ?Musculoskeletal:  ?   Cervical back: Neck supple.  ?Neurological:  ?   General: No focal deficit present.  ?   Mental Status: He is alert.  ?Psychiatric:     ?   Mood and Affect: Mood normal.     ?   Behavior: Behavior normal.   ? ? ?------------------------------------------------------------------------------------------------------------------------------------------------------------------------------------------------------------------- ?Assessment and Plan ? ?Essential hypertension ?BP is doing well, recommend continuation of current medication.   ? ?Ischemic cardiomyopathy ?Continues with cardiac rehab and med management at this time.  ? ?Adjustment disorder ?Increasing lexapro to 20mg .   ? ? ?Meds ordered this encounter  ?Medications  ? escitalopram (LEXAPRO) 20 MG tablet  ?  Sig: Take 1 table po daily.  ?  Dispense:  90 tablet  ?  Refill:  0  ? ? ?Return in about 3 months (around 09/28/2021) for anxiety. ? ? ? ?This visit occurred during the SARS-CoV-2 public health emergency.  Safety protocols were in place, including screening questions prior to the visit, additional usage of staff PPE, and extensive cleaning of exam room while observing appropriate contact time as indicated for disinfecting solutions.  ? ?

## 2021-07-29 ENCOUNTER — Encounter: Payer: Self-pay | Admitting: Family Medicine

## 2021-07-29 ENCOUNTER — Telehealth (INDEPENDENT_AMBULATORY_CARE_PROVIDER_SITE_OTHER): Payer: No Typology Code available for payment source | Admitting: Family Medicine

## 2021-07-29 DIAGNOSIS — J441 Chronic obstructive pulmonary disease with (acute) exacerbation: Secondary | ICD-10-CM | POA: Insufficient documentation

## 2021-07-29 MED ORDER — PREDNISONE 50 MG PO TABS: ORAL_TABLET | ORAL | 0 refills | Status: DC

## 2021-07-29 MED ORDER — DOXYCYCLINE HYCLATE 100 MG PO TABS: 100.0000 mg | ORAL_TABLET | Freq: Two times a day (BID) | ORAL | 0 refills | Status: AC

## 2021-07-29 MED ORDER — GUAIFENESIN-CODEINE 100-10 MG/5ML PO SYRP: 5.0000 mL | ORAL_SOLUTION | Freq: Three times a day (TID) | ORAL | 0 refills | Status: DC | PRN

## 2021-07-29 NOTE — Progress Notes (Signed)
Started on Monday. Cold sx. Congestion started on Tuesday. COVID Negative. Chest pain from coughing continually.  Meds: Mucinex

## 2021-07-29 NOTE — Progress Notes (Signed)
Paul Hardy - 61 y.o. male MRN [id number]  Date of birth: February 24, 1960   This visit type was conducted due to national recommendations for restrictions regarding the COVID-19 Pandemic (e.g. social distancing).  This format is felt to be most appropriate for this patient at this time.  All issues noted in this document were discussed and addressed.  No physical exam was performed (except for noted visual exam findings with Video Visits).  I discussed the limitations of evaluation and management by telemedicine and the availability of in person appointments. The patient expressed understanding and agreed to proceed.  I connected withNAME@ on 07/29/21 at  2:50 PM EDT by a video enabled telemedicine application and verified that I am speaking with the correct person using two identifiers.  Present at visit: Paul Coombe, DO [facility]   Patient Location: Home [address] [place] [address]   Provider location:   [facility]  Chief Complaint  Patient presents with   Cough    HPI  Paul Hardy is a 61 y.o. male who presents via audio/video conferencing for a telehealth visit today.  He has complaint of cough, wheezing, and congestion.  Symptoms started about 5 days ago.  Cough is keeping him up at night.  He reports that he is using his albuterol inhaler more often.   He is using symbicort daily.  He denies fever, chills, nausea, vomiting, diarrhea, sinus pain or headache.    ROS:  A comprehensive ROS was completed and negative except as noted per HPI  Past Medical History:  Diagnosis Date   CHF (congestive heart failure) (HCC)     Past Surgical History:  Procedure Laterality Date   CARDIAC SURGERY     CHOLECYSTECTOMY, LAPAROSCOPIC     VASECTOMY      Family History  Problem Relation Age of Onset   Cancer Mother     Social History   Socioeconomic History   Marital status: Married    Spouse name: Not on file   Number of children: Not on file   Years of  education: Not on file   Highest education level: Not on file  Occupational History   Not on file  Tobacco Use   Smoking status: Former    Packs/day: 1.00    Years: 20.00    Total pack years: 20.00    Types: Cigarettes    Start date: 02/21/1998    Quit date: 11/24/2020    Years since quitting: 0.6    Passive exposure: Never   Smokeless tobacco: Never  Vaping Use   Vaping Use: Never used  Substance and Sexual Activity   Alcohol use: Yes   Drug use: Never   Sexual activity: Yes    Partners: Female  Other Topics Concern   Not on file  Social History Narrative   Not on file   Social Determinants of Health   Financial Resource Strain: Not on file  Food Insecurity: Not on file  Transportation Needs: Not on file  Physical Activity: Not on file  Stress: Not on file  Social Connections: Not on file  Intimate Partner Violence: Not on file     Current Outpatient Medications:    albuterol (VENTOLIN HFA) 108 (90 Base) MCG/ACT inhaler, Inhale into the lungs., Disp: , Rfl:    AMBULATORY NON FORMULARY MEDICATION, Please provide incentive spirometer.  Use every 2 hours.  Dx: Post-operative state/CABG Z95.1, Disp: 1 Device, Rfl: 0   AMBULATORY NON FORMULARY MEDICATION, Please provide rollator walker.  Diagnosis:  Gait instability, CABG R26.81, Z95.1, Disp: 1 Device, Rfl: 0   aspirin 81 MG EC tablet, Take by mouth., Disp: , Rfl:    atorvastatin (LIPITOR) 80 MG tablet, Take 80 mg by mouth at bedtime., Disp: , Rfl:    budesonide-formoterol (SYMBICORT) 160-4.5 MCG/ACT inhaler, Inhale 2 puffs into the lungs in the morning and at bedtime., Disp: 10.2 g, Rfl: 5   doxycycline (VIBRA-TABS) 100 MG tablet, Take 1 tablet (100 mg total) by mouth 2 (two) times daily for 10 days., Disp: 20 tablet, Rfl: 0   escitalopram (LEXAPRO) 20 MG tablet, Take 1 table po daily., Disp: 90 tablet, Rfl: 0   furosemide (LASIX) 40 MG tablet, Take 40 mg by mouth daily., Disp: , Rfl:    guaiFENesin-codeine (ROBITUSSIN AC)  100-10 MG/5ML syrup, Take 5 mLs by mouth 3 (three) times daily as needed for cough., Disp: 120 mL, Rfl: 0   isosorbide mononitrate (IMDUR) 30 MG 24 hr tablet, Take 30 mg by mouth daily., Disp: , Rfl:    KLOR-CON M20 20 MEQ tablet, Take 20 mEq by mouth daily., Disp: , Rfl:    metoprolol succinate (TOPROL-XL) 50 MG 24 hr tablet, Take 50 mg by mouth daily., Disp: , Rfl:    Multiple Vitamin (MULTIVITAMIN) tablet, Take 1 tablet by mouth daily., Disp: , Rfl:    nitroGLYCERIN (NITROSTAT) 0.4 MG SL tablet, Place under the tongue., Disp: , Rfl:    omeprazole (PRILOSEC) 40 MG capsule, Take 40 mg by mouth daily., Disp: , Rfl:    omeprazole-sodium bicarbonate (ZEGERID) 40-1100 MG capsule, Take by mouth., Disp: , Rfl:    ondansetron (ZOFRAN ODT) 4 MG disintegrating tablet, Take 1 tablet (4 mg total) by mouth every 8 (eight) hours as needed for nausea or vomiting., Disp: 20 tablet, Rfl: 0   predniSONE (DELTASONE) 50 MG tablet, Take 1 tab po daily x5 days, Disp: 5 tablet, Rfl: 0   ranolazine (RANEXA) 500 MG 12 hr tablet, Take 500 mg by mouth 2 (two) times daily., Disp: , Rfl:    sildenafil (REVATIO) 20 MG tablet, Take as few as 2 and as many as 5 by mouth at least one hour prior to sex, Disp: 30 tablet, Rfl: 5   ticagrelor (BRILINTA) 90 MG TABS tablet, Take by mouth., Disp: , Rfl:   EXAM:  VITALS per patient if applicable: BP 100/60   Pulse 68   Temp 98.6 F (37 C)   Ht  (1.854 m)   Wt 210 lb (95.3 kg)   BMI 27.71 kg/m   GENERAL: alert, oriented, appears well and in no acute distress  HEENT: atraumatic, conjunttiva clear, no obvious abnormalities on inspection of external nose and ears  NECK: normal movements of the head and neck  LUNGS: on inspection no signs of respiratory distress, breathing rate appears normal, no obvious gross SOB, gasping or wheezing  CV: no obvious cyanosis  MS: moves all visible extremities without noticeable abnormality  PSYCH/NEURO: pleasant and cooperative, no  obvious depression or anxiety, speech and thought processing grossly intact  ASSESSMENT AND PLAN:  Discussed the following assessment and plan:  COPD exacerbation (HCC) Start prednsione burst  daily x5 days.  Starting course of doxycycline as well.  Cheratussin cough syrup as needed.  Recommend increased fluid intake.  Continue daily Symbicort and albuterol as needed.  Instructed to contact clinic if his symptoms are worsening.       I discussed the assessment and treatment plan with the patient. The patient was provided an  opportunity to ask questions and all were answered. The patient agreed with the plan and demonstrated an understanding of the instructions.   The patient was advised to call back or seek an in-person evaluation if the symptoms worsen or if the condition fails to improve as anticipated.    Paul Coombe, DO

## 2021-07-29 NOTE — Assessment & Plan Note (Signed)
Start prednsione burst 50mg  daily x5 days.  Starting course of doxycycline as well.  Cheratussin cough syrup as needed.  Recommend increased fluid intake.  Continue daily Symbicort and albuterol as needed.  Instructed to contact clinic if his symptoms are worsening.

## 2021-08-02 ENCOUNTER — Telehealth: Payer: Self-pay | Admitting: Family Medicine

## 2021-08-02 NOTE — Telephone Encounter (Signed)
Pt's wife called and said he was admitted to [facility]. She said last night he starting choking, than passed out and hit his face which lead to him to getting a black eye. She said when she checked his bp it was 80/50. They are running a lot of test on him and she also said his labs came back bad. She is not sure how long he will stay for. She asked for someone to give her a call.

## 2021-08-02 NOTE — Telephone Encounter (Signed)
Please contact to see if there are any needs at this time?

## 2021-08-03 NOTE — Telephone Encounter (Signed)
LVM for spouse returning call. Callback information was provided for any possible needs.

## 2021-08-26 ENCOUNTER — Ambulatory Visit (INDEPENDENT_AMBULATORY_CARE_PROVIDER_SITE_OTHER): Payer: No Typology Code available for payment source | Admitting: Family Medicine

## 2021-08-26 ENCOUNTER — Encounter: Payer: Self-pay | Admitting: Family Medicine

## 2021-08-26 VITALS — BP 111/73 | HR 60 | Ht 73.0 in | Wt 213.0 lb

## 2021-08-26 DIAGNOSIS — E785 Hyperlipidemia, unspecified: Secondary | ICD-10-CM

## 2021-08-26 DIAGNOSIS — R0602 Shortness of breath: Secondary | ICD-10-CM

## 2021-08-26 DIAGNOSIS — F4322 Adjustment disorder with anxiety: Secondary | ICD-10-CM | POA: Diagnosis not present

## 2021-08-26 DIAGNOSIS — R55 Syncope and collapse: Secondary | ICD-10-CM | POA: Insufficient documentation

## 2021-08-26 DIAGNOSIS — I10 Essential (primary) hypertension: Secondary | ICD-10-CM | POA: Diagnosis not present

## 2021-08-26 NOTE — Assessment & Plan Note (Signed)
Continue atorvastatin at current strength.  

## 2021-08-26 NOTE — Progress Notes (Signed)
Deadrian Toya - 61 y.o. male MRN [id number]  Date of birth: [birth year]  Subjective No chief complaint on file.   HPI Nial Hawe is a 61 y.o. male here today for hospital follow up .    Seen in ED on 6/12 for hypotension and syncope.  Cardiac enzymes mildly elevated.  EKG without signs of ischemia and he did not have chest pain.  He did have some mild bradycardia as well.  Toprol-XL decreased from 100 to 50mg  daily.   Imdur and entresto were also held.  He has followed up with cardiology and brilinta changed to plavix.  He remains off of imdur and entresto.  Continues on reduced strength of metoprolol.  He continues to have dyspnea.  Cardiology believes this may be related to adjustment and anxiety.  Remains on lexapro but is waiting to hear to schedule counseling appt.    ROS:  A comprehensive ROS was completed and negative except as noted per HPI   Allergies  Allergen Reactions   Bee Venom Anaphylaxis   Iodinated Contrast Media Anaphylaxis    And cardiac arrest   Hydrochlorothiazide Other (See Comments)    Dehydration and muscle spasms. Lasix does the same   Lisinopril Cough    Losartan is OK    Past Medical History:  Diagnosis Date   CHF (congestive heart failure) (HCC)     Past Surgical History:  Procedure Laterality Date   CARDIAC SURGERY     CHOLECYSTECTOMY, LAPAROSCOPIC     VASECTOMY      Social History   Socioeconomic History   Marital status: Married    Spouse name: Not on file   Number of children: Not on file   Years of education: Not on file   Highest education level: Not on file  Occupational History   Not on file  Tobacco Use   Smoking status: Former    Packs/day: 1.00    Years: 20.00    Total pack years: 20.00    Types: Cigarettes    Start date: 02/21/1998    Quit date: 11/24/2020    Years since quitting: 0.7    Passive exposure: Never   Smokeless tobacco: Never  Vaping Use   Vaping Use: Never used  Substance and Sexual Activity   Alcohol  use: Yes   Drug use: Never   Sexual activity: Yes    Partners: Female  Other Topics Concern   Not on file  Social History Narrative   Not on file   Social Determinants of Health   Financial Resource Strain: Not on file  Food Insecurity: Not on file  Transportation Needs: Not on file  Physical Activity: Not on file  Stress: Not on file  Social Connections: Not on file    Family History  Problem Relation Age of Onset   Cancer Mother     Health Maintenance  Topic Date Due   COVID-19 Vaccine (5 - Moderna series) 09/26/2020   Hepatitis C Screening  06/29/2022 (Originally 08/20/1978)   HIV Screening  06/29/2022 (Originally 08/20/1975)   INFLUENZA VACCINE  09/21/2021   Fecal DNA (Cologuard)  11/04/2023   TETANUS/TDAP  06/07/2029   Zoster Vaccines- Shingrix  Completed   HPV VACCINES  Aged Out     ----------------------------------------------------------------------------------------------------------------------------------------------------------------------------------------------------------------- Physical Exam BP 111/73 (BP Location: Left Arm, Patient Position: Sitting, Cuff Size: Normal)   Pulse 60   Ht 6\' 1"  (1.854 m)   Wt 213 lb (96.6 kg)   SpO2 97%   BMI 28.10 kg/m  Physical Exam Constitutional:      Appearance: Normal appearance.  Eyes:     General: No scleral icterus. Cardiovascular:     Rate and Rhythm: Normal rate and regular rhythm.  Pulmonary:     Effort: Pulmonary effort is normal.     Breath sounds: Normal breath sounds.  Musculoskeletal:     Cervical back: Neck supple.  Neurological:     General: No focal deficit present.     Mental Status: He is alert.  Psychiatric:        Mood and Affect: Mood normal.        Behavior: Behavior normal.      ------------------------------------------------------------------------------------------------------------------------------------------------------------------------------------------------------------------- Assessment and Plan  Essential hypertension BP is stable with current medications.  Recommend continuation of reduced dose of metoprolol at 50mg  daily.   SOB (shortness of breath) Chronic dyspnea may be related to his anxiety.  Referral made for talk therapy.   Dyslipidemia Continue atorvastatin at current strength.    Syncopal episodes Related to orthostatic hypotension.  Improved with adjustment of cardiac medications.     No orders of the defined types were placed in this encounter.   No follow-ups on file.  30 minutes spent including pre visit preparation, review of prior notes and labs, encounter with patient and same day documentation.   This visit occurred during the SARS-CoV-2 public health emergency.  Safety protocols were in place, including screening questions prior to the visit, additional usage of staff PPE, and extensive cleaning of exam room while observing appropriate contact time as indicated for disinfecting solutions.

## 2021-08-26 NOTE — Assessment & Plan Note (Signed)
Chronic dyspnea may be related to his anxiety.  Referral made for talk therapy.

## 2021-08-26 NOTE — Assessment & Plan Note (Signed)
Related to orthostatic hypotension.  Improved with adjustment of cardiac medications.

## 2021-08-26 NOTE — Assessment & Plan Note (Signed)
BP is stable with current medications.  Recommend continuation of reduced dose of metoprolol at 50mg  daily.

## 2021-09-17 ENCOUNTER — Encounter: Payer: Self-pay | Admitting: Pulmonary Disease

## 2021-09-17 ENCOUNTER — Ambulatory Visit (INDEPENDENT_AMBULATORY_CARE_PROVIDER_SITE_OTHER): Payer: No Typology Code available for payment source | Admitting: Pulmonary Disease

## 2021-09-17 VITALS — BP 140/80 | HR 65 | Temp 97.9°F | Ht 73.0 in | Wt 216.2 lb

## 2021-09-17 DIAGNOSIS — R0602 Shortness of breath: Secondary | ICD-10-CM

## 2021-09-17 DIAGNOSIS — G4733 Obstructive sleep apnea (adult) (pediatric): Secondary | ICD-10-CM

## 2021-09-17 NOTE — Addendum Note (Signed)
Addended by: Jacquiline Doe on: 09/17/2021 09:10 AM   Modules accepted: Orders

## 2021-09-17 NOTE — Patient Instructions (Addendum)
Continue on Symbicort We will send a prescription to [facility] for full facemask Follow-up in 6 months

## 2021-09-17 NOTE — Progress Notes (Signed)
Paul Hardy    [id number]    May 22, 1960  Primary Care Physician:Matthews, Selena Batten, DO  Referring Physician: Everrett Coombe, DO [address] [address] [place],  [place] [address]  Chief complaint: Follow-up for dyspnea  HPI: 61 year old ex-smoker with coronary artery disease Suffered cardiac arrest in October 2022 requiring triple bypass surgery.  He has had intermittent shortness of breath since then and was told that this is secondary to Brilinta.  He has had a pulmonary evaluation including PFTs which show some restriction and diffusion impairment.  Chest x-ray with basilar atelectasis with a VQ scan at [facility] ED with low probability of pulmonary embolism.  Continues follow-up with cardiology at [facility] for ischemic cardiomyopathy with EF 40-45%.    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  No feather pillows or comforter Smoking history: 60-pack-year smoker.  Quit in October 2022 Travel history: No significant travel history Relevant family history: No family history of lung disease  Interim history: Started on Symbicort at last visit.  He states that he notices some mild improvement but continues to have dyspnea Has been ordered CPAP AutoSet with nasal pillow but is finding it difficult to use as he is a mouth breather.  He needs [facility]emask [facility] as [facility] does not have the supplies   Outpatient Encounter Medications as of 09/17/2021  Medication Sig   albuterol (VENTOLIN HFA) 108 (90 Base) MCG/ACT inhaler Inhale into the lungs.   AMBULATORY NON FORMULARY MEDICATION Please provide incentive spirometer.  Use every 2 hours.  Dx: Post-operative state/CABG Z95.1   AMBULATORY NON FORMULARY MEDICATION Please provide rollator walker.  Diagnosis: Gait instability, CABG R26.81, Z95.1   aspirin 81 MG EC tablet Take by mouth.   atorvastatin  (LIPITOR) 80 MG tablet Take 80 mg by mouth at bedtime.   budesonide-formoterol (SYMBICORT) 160-4.5 MCG/ACT inhaler Inhale 2 puffs into the lungs in the morning and at bedtime.   clopidogrel (PLAVIX) 75 MG tablet Take 75 mg by mouth daily.   escitalopram (LEXAPRO) 20 MG tablet Take 1 table po daily.   furosemide (LASIX) 40 MG tablet Take 40 mg by mouth daily. As needed   KLOR-CON M20 20 MEQ tablet Take 20 mEq by mouth daily. As needed   metoprolol succinate (TOPROL-XL) 50 MG 24 hr tablet Take 50 mg by mouth daily.   Multiple Vitamin (MULTIVITAMIN) tablet Take 1 tablet by mouth daily.   nitroGLYCERIN (NITROSTAT) 0.4 MG SL tablet Place under the tongue.   omeprazole (PRILOSEC) 40 MG capsule Take 40 mg by mouth daily.   ondansetron (ZOFRAN ODT) 4 MG disintegrating tablet Take 1 tablet (4 mg total) by mouth every 8 (eight) hours as needed for nausea or vomiting.   ranolazine (RANEXA) 500 MG 12 hr tablet Take 500 mg by mouth 2 (two) times daily.   sildenafil (REVATIO) 20 MG tablet Take as few as 2 and as many as 5 by mouth at least one hour prior to sex   No facility-administered encounter medications on file as of 09/17/2021.   Physical Exam: Blood pressure 140/80, pulse 65, temperature 97.9 F (36.6 C), temperature source Oral, height 6\' 1"  (1.854 m), weight 216 lb 3.2 oz (98.1 kg), SpO2 97 %. Gen:      No acute distress HEENT:  EOMI, sclera anicteric Neck:     No masses; no thyromegaly Lungs:  Clear to auscultation bilaterally; normal respiratory effort CV:         Regular rate and rhythm; no murmurs Abd:      + bowel sounds; soft, non-tender; no palpable masses, no distension Ext:    No edema; adequate peripheral perfusion Skin:      Warm and dry; no rash Neuro: alert and oriented x 3 Psych: normal mood and affect   Data Reviewed: Imaging: Chest x-ray 01/06/2021-no acute cardiopulmonary abnormality Chest x-ray 01/11/2021-bibasal atelectasis VQ scan 01/11/2021-low probability for  pulmonary embolism High-resolution CT 02/04/2021-no interstitial lung disease or lung abnormalities I have reviewed the images personally  PFTs: PFTs from [facility] 12/28/2020 The FEV1 and FVC are moderately reduced and suggest a restrictive lung defect. The TLC is mildly reduced. There is a mild reduction of the diffusing capacity  03/16/2021 FVC 3.17 [64%], FEV1 2.61 [69%], TLC 5.47 [76%], DLCO 17.43 [61%] Mild restriction, moderate diffusion defect  Labs: 01/11/2021 from [facility] D-dimer -0.76 proBNP 479 Troponin -92 CMP normal except for creatinine of 1.3 CBC WNL  Assessment:  Assessment for dyspnea Has intermittent shortness of breath which appears to have come on after recent heart attack and CABG CT with no interstitial lung abnormalities He is also assessed for PE with low probability VQ scan PFTs show restriction and diffusion impairment which I suspect is from heart failure and body habitus.  He does have occasional wheeze and allergies and may have reactive airway disease Continue Symbicort  Suspected sleep apnea Reviewed sleep study from outside pulmonary clinic showing moderate sleep apnea AutoSet CPAP 5-15 has been ordered through [facility] but he requires a full phalloplasty [facility]  Plan/Recommendations: AutoSet CPAP, ordered full facemask. Symbicort 160/4.5  Chilton Greathouse MD [facility] 09/17/2021, 8:53 AM  CC: Everrett Coombe, DO

## 2021-09-19 ENCOUNTER — Other Ambulatory Visit: Payer: Self-pay | Admitting: Family Medicine

## 2021-09-28 ENCOUNTER — Encounter: Payer: Self-pay | Admitting: Family Medicine

## 2021-09-28 ENCOUNTER — Ambulatory Visit (INDEPENDENT_AMBULATORY_CARE_PROVIDER_SITE_OTHER): Payer: No Typology Code available for payment source | Admitting: Family Medicine

## 2021-09-28 VITALS — BP 112/74 | HR 78 | Ht 73.0 in | Wt 214.0 lb

## 2021-09-28 DIAGNOSIS — I10 Essential (primary) hypertension: Secondary | ICD-10-CM | POA: Diagnosis not present

## 2021-09-28 DIAGNOSIS — R0602 Shortness of breath: Secondary | ICD-10-CM

## 2021-09-28 DIAGNOSIS — G629 Polyneuropathy, unspecified: Secondary | ICD-10-CM | POA: Diagnosis not present

## 2021-09-28 DIAGNOSIS — F4322 Adjustment disorder with anxiety: Secondary | ICD-10-CM

## 2021-09-28 DIAGNOSIS — I7300 Raynaud's syndrome without gangrene: Secondary | ICD-10-CM

## 2021-09-28 MED ORDER — DULOXETINE HCL 30 MG PO CPEP: 30.0000 mg | ORAL_CAPSULE | Freq: Every day | ORAL | 3 refills | Status: DC

## 2021-09-28 NOTE — Assessment & Plan Note (Signed)
Continues to have chronic dyspnea.  He will continue to see pulmonology.  He may be a form of anxiety contributing as well.

## 2021-09-28 NOTE — Assessment & Plan Note (Signed)
Still having problems with motivation and energy.  We discussed trial of different medication.  Wean from Lexapro and start Cymbalta as he is having some issues with chronic pain and neuropathy as well.

## 2021-09-28 NOTE — Patient Instructions (Addendum)
Take 1/2 of lexapro x1 week then 1/2 tab every other day for 1 week.  On days you are not taking lexapro start cymbalta (duloxetine)

## 2021-09-28 NOTE — Progress Notes (Signed)
Paul Hardy - 61 y.o. male MRN [id number]  Date of birth: May 08, 1960  Subjective Chief Complaint  Patient presents with   Hypotension    HPI Paul Hardy is a 61 year old male here today for follow-up visit.  Reports that he continues to suffer from shortness of breath.  Has been evaluated by both cardiology and pulmonology.  They believe there may be an anxiety component.  Anxiety has lessened with Lexapro however still feels lack of motivation to do things at home.  He has been sleeping in late most mornings which is atypical for him.  He would be interested in trying a different medication.  He did start his first therapy session today.  He is on the fence about this.  He is also having some episodes of discoloration of his lower extremities.  Reports that lower extremities especially the right lower extremity will turn bluish-purple and cold.  This will extend only up into the scrotal area.  It does improve with getting into a warm bath.  He has had some burning sensation that radiates into the leg at times as well.  No weakness in the leg.  ROS:  A comprehensive ROS was completed and negative except as noted per HPI  Allergies  Allergen Reactions   Bee Venom Anaphylaxis   Iodinated Contrast Media Anaphylaxis    And cardiac arrest   Hydrochlorothiazide Other (See Comments)    Dehydration and muscle spasms. Lasix does the same   Lisinopril Cough    Losartan is OK    Past Medical History:  Diagnosis Date   CHF (congestive heart failure) (HCC)     Past Surgical History:  Procedure Laterality Date   CARDIAC SURGERY     CHOLECYSTECTOMY, LAPAROSCOPIC     VASECTOMY      Social History   Socioeconomic History   Marital status: Married    Spouse name: Not on file   Number of children: Not on file   Years of education: Not on file   Highest education level: Not on file  Occupational History   Not on file  Tobacco Use   Smoking status: Former    Packs/day: 1.00    Years:  20.00    Total pack years: 20.00    Types: Cigarettes    Start date: 02/21/1998    Quit date: 11/24/2020    Years since quitting: 0.8    Passive exposure: Never   Smokeless tobacco: Never  Vaping Use   Vaping Use: Never used  Substance and Sexual Activity   Alcohol use: Yes   Drug use: Never   Sexual activity: Yes    Partners: Female  Other Topics Concern   Not on file  Social History Narrative   Not on file   Social Determinants of Health   Financial Resource Strain: Not on file  Food Insecurity: Not on file  Transportation Needs: Not on file  Physical Activity: Not on file  Stress: Not on file  Social Connections: Not on file    Family History  Problem Relation Age of Onset   Cancer Mother     Health Maintenance  Topic Date Due   COVID-19 Vaccine (5 - Moderna series) 09/26/2020   INFLUENZA VACCINE  09/21/2021   Hepatitis C Screening  06/29/2022 (Originally 08/20/1978)   HIV Screening  06/29/2022 (Originally 08/20/1975)   Fecal DNA (Cologuard)  11/04/2023   TETANUS/TDAP  06/07/2029   Zoster Vaccines- Shingrix  Completed   HPV VACCINES  Aged Out     -----------------------------------------------------------------------------------------------------------------------------------------------------------------------------------------------------------------  Physical Exam BP 112/74 (BP Location: Left Arm, Patient Position: Sitting, Cuff Size: Normal)   Pulse 78   Ht 6\' 1"  (1.854 m)   Wt 214 lb (97.1 kg)   SpO2 97%   BMI 28.23 kg/m   Physical Exam Constitutional:      Appearance: Normal appearance.  Eyes:     General: No scleral icterus. Cardiovascular:     Rate and Rhythm: Normal rate and regular rhythm.     Comments: Slightly diminished pulses on the right lower extremity.  No discoloration noted today. Pulmonary:     Effort: Pulmonary effort is normal.     Breath sounds: Normal breath sounds.  Musculoskeletal:     Cervical back: Neck supple.   Neurological:     Mental Status: He is alert.  Psychiatric:        Mood and Affect: Mood normal.        Behavior: Behavior normal.     ------------------------------------------------------------------------------------------------------------------------------------------------------------------------------------------------------------------- Assessment and Plan  Essential hypertension Blood pressure stable at this time.  Will plan to continue current medications for management of hypertension.  SOB (shortness of breath) Continues to have chronic dyspnea.  He will continue to see pulmonology.  He may be a form of anxiety contributing as well.  Adjustment disorder Still having problems with motivation and energy.  We discussed trial of different medication.  Wean from Lexapro and start Cymbalta as he is having some issues with chronic pain and neuropathy as well.  Raynaud's disease without gangrene ABIs ordered.   Meds ordered this encounter  Medications   DULoxetine (CYMBALTA) 30 MG capsule    Sig: Take 1 capsule (30 mg total) by mouth daily.    Dispense:  30 capsule    Refill:  3    Return in about 6 weeks (around 11/09/2021) for f/u duloxetine.    This visit occurred during the SARS-CoV-2 public health emergency.  Safety protocols were in place, including screening questions prior to the visit, additional usage of staff PPE, and extensive cleaning of exam room while observing appropriate contact time as indicated for disinfecting solutions.

## 2021-09-28 NOTE — Assessment & Plan Note (Signed)
ABIs ordered. 

## 2021-09-28 NOTE — Assessment & Plan Note (Signed)
Blood pressure stable at this time.  Will plan to continue current medications for management of hypertension.

## 2021-09-29 LAB — VITAMIN B12: Vitamin B-12: 498 pg/mL (ref 200–1100)

## 2021-09-29 LAB — HEMOGLOBIN A1C
Hgb A1c MFr Bld: 5.9 % of total Hgb — ABNORMAL HIGH (ref <5.7)
Mean Plasma Glucose: 123 mg/dL
eAG (mmol/L): 6.8 mmol/L

## 2021-10-06 ENCOUNTER — Ambulatory Visit (HOSPITAL_COMMUNITY)
Admission: RE | Admit: 2021-10-06 | Discharge: 2021-10-06 | Disposition: A | Discharge status: Home / Self Care | Payer: No Typology Code available for payment source | Source: Ambulatory Visit | Attending: Internal Medicine | Admitting: Internal Medicine

## 2021-10-06 DIAGNOSIS — I7300 Raynaud's syndrome without gangrene: Secondary | ICD-10-CM | POA: Diagnosis present

## 2021-10-18 ENCOUNTER — Telehealth: Payer: Self-pay | Admitting: Family Medicine

## 2021-10-18 NOTE — Telephone Encounter (Signed)
Patient dropped off FMLA paperwork for PCP to complete. Patient was informed of possible fee and 3-5 day turn around . Paperwork placed in providers box. - lmr.

## 2021-10-20 ENCOUNTER — Other Ambulatory Visit: Payer: Self-pay | Admitting: Family Medicine

## 2021-10-22 NOTE — Telephone Encounter (Signed)
Document completed. Please scan to chart and contact patient for pick-up & Payment.   Docs on [employer]. Thanks

## 2021-10-22 NOTE — Telephone Encounter (Signed)
Patient notified for pick-up/fee and scanned to chart.

## 2021-10-28 ENCOUNTER — Ambulatory Visit (INDEPENDENT_AMBULATORY_CARE_PROVIDER_SITE_OTHER): Payer: No Typology Code available for payment source | Admitting: Podiatry

## 2021-10-28 ENCOUNTER — Encounter: Payer: Self-pay | Admitting: Podiatry

## 2021-10-28 DIAGNOSIS — B351 Tinea unguium: Secondary | ICD-10-CM

## 2021-10-28 NOTE — Progress Notes (Signed)
  Subjective:  Patient ID: Paul Hardy, male    DOB: [birth year],   MRN: [id number]  Chief Complaint  Patient presents with   Nail Problem    Bilateral fungus on going for months    61 y.o. male presents for concern of bilateral great toe fungus that has been ongoing for years. Relates discoloration and white spots on the toes. He is not a diabetic. His PCP offered medications but unable to take.  . Denies any other pedal complaints. Denies n/v/f/c.   Past Medical History:  Diagnosis Date   CHF (congestive heart failure) (HCC)     Objective:  Physical Exam: Vascular: DP/PT pulses 2/4 bilateral. CFT <3 seconds. Normal hair growth on digits. No edema.  Skin. No lacerations or abrasions bilateral feet. Bilatearl hallux nails are thickened and discolorated with subungual debris.  Musculoskeletal: MMT 5/5 bilateral lower extremities in DF, PF, Inversion and Eversion. Deceased ROM in DF of ankle joint.  Neurological: Sensation intact to light touch.   Assessment:   1. Onychomycosis      Plan:  Patient was evaluated and treated and all questions answered. -Examined patient -Discussed treatment options for painful dystrophic nails  -Clinical picture and Fungal culture was obtained by removing a portion of the hard nail itself from each of the involved toenails using a sterile nail nipper and sent to [facility] lab. Patient tolerated the biopsy procedure well without discomfort or need for anesthesia.  -Discussed fungal nail treatment options including oral, topical, and laser treatments.  -Patient to return in 4 weeks for follow up evaluation and discussion of fungal culture results or sooner if symptoms worsen.   Louann Sjogren, DPM

## 2021-11-09 ENCOUNTER — Ambulatory Visit: Payer: No Typology Code available for payment source | Admitting: Family Medicine

## 2021-11-18 ENCOUNTER — Telehealth: Payer: Self-pay | Admitting: Family Medicine

## 2021-11-18 NOTE — Telephone Encounter (Signed)
Updated rx sent

## 2021-11-19 ENCOUNTER — Other Ambulatory Visit: Payer: Self-pay | Admitting: Family Medicine

## 2021-11-24 ENCOUNTER — Encounter: Payer: Self-pay | Admitting: Podiatry

## 2021-11-25 ENCOUNTER — Ambulatory Visit (INDEPENDENT_AMBULATORY_CARE_PROVIDER_SITE_OTHER): Payer: No Typology Code available for payment source | Admitting: Podiatrist

## 2021-11-25 ENCOUNTER — Encounter: Payer: Self-pay | Admitting: Podiatrist

## 2021-11-25 DIAGNOSIS — Z79899 Other long term (current) drug therapy: Secondary | ICD-10-CM

## 2021-11-25 DIAGNOSIS — B351 Tinea unguium: Secondary | ICD-10-CM | POA: Diagnosis not present

## 2021-11-25 MED ORDER — TERBINAFINE HCL 250 MG PO TABS: 250.0000 mg | ORAL_TABLET | Freq: Every day | ORAL | 2 refills | Status: DC

## 2021-11-25 NOTE — Progress Notes (Signed)
Chief Complaint  Patient presents with   Nail Problem     Fungus f/u - patient states nails are about the same     HPI: Patient is 61 y.o. male who presents today for follow-up of fungal toenails.  He has been applying topical treatments and states that the nails are about the same.  A culture was performed at the last visit and he is also here for the results.   Allergies  Allergen Reactions   Bee Venom Anaphylaxis   Iodinated Contrast Media Anaphylaxis    And cardiac arrest   Hydrochlorothiazide Other (See Comments)    Dehydration and muscle spasms. Lasix does the same   Lisinopril Cough    Losartan is OK    Review of systems is negative except as noted in the HPI.  Denies nausea/ vomiting/ fevers/ chills or night sweats.   Denies difficulty breathing, denies calf pain or tenderness  Physical Exam  Patient is awake, alert, and oriented x 3.  In no acute distress.    Vascular status is intact with palpable pedal pulses DP and PT bilateral and capillary refill time less than 3 seconds bilateral.  No edema or erythema noted.   Neurological exam reveals epicritic and protective sensation grossly intact bilateral.   Dermatological exam reveals skin is supple and dry to bilateral feet.  Multiple digital nails are thickened, discolored, dystrophic and clinically mycotic especially bilateral first.  Musculoskeletal exam: Musculature intact with dorsiflexion, plantarflexion, inversion, eversion. Ankle and First MPJ joint range of motion normal.   Culture report shows onychomycosis present.  Assessment:   ICD-10-CM   1. Encounter for long-term (current) use of high-risk medication  Z79.899 Hepatic function panel    2. Onychomycosis  B35.1        Plan: Discussed treatment options and alternatives.  He would like to try an oral medication as topicals do not work for him.  After reviewing his recent labs a prescription for Lamisil was called into his pharmacy.  He will repeat the  blood work after 1 month of taking the medication.  I will call with this result.  If any problems or concerns arise while on the medication he will call and otherwise will be seen back in the future for follow-up.

## 2021-11-26 ENCOUNTER — Telehealth: Payer: No Typology Code available for payment source | Admitting: Family Medicine

## 2021-11-26 ENCOUNTER — Telehealth: Payer: Self-pay | Admitting: General Practice

## 2021-11-26 NOTE — Telephone Encounter (Signed)
Transition Care Management Unsuccessful Follow-up Telephone Call  Date of discharge and from where:  11/25/21 from [facility]  Attempts:  1st Attempt  Reason for unsuccessful TCM follow-up call:  No answer/busy

## 2021-11-27 ENCOUNTER — Ambulatory Visit (INDEPENDENT_AMBULATORY_CARE_PROVIDER_SITE_OTHER): Payer: No Typology Code available for payment source

## 2021-11-27 ENCOUNTER — Ambulatory Visit
Admission: EM | Admit: 2021-11-27 | Discharge: 2021-11-27 | Disposition: A | Discharge status: Home / Self Care | Payer: No Typology Code available for payment source | Attending: Family Medicine | Admitting: Family Medicine

## 2021-11-27 DIAGNOSIS — Z79899 Other long term (current) drug therapy: Secondary | ICD-10-CM | POA: Insufficient documentation

## 2021-11-27 DIAGNOSIS — Z951 Presence of aortocoronary bypass graft: Secondary | ICD-10-CM | POA: Insufficient documentation

## 2021-11-27 DIAGNOSIS — R0602 Shortness of breath: Secondary | ICD-10-CM | POA: Insufficient documentation

## 2021-11-27 DIAGNOSIS — R509 Fever, unspecified: Secondary | ICD-10-CM | POA: Diagnosis not present

## 2021-11-27 DIAGNOSIS — Z7952 Long term (current) use of systemic steroids: Secondary | ICD-10-CM | POA: Insufficient documentation

## 2021-11-27 DIAGNOSIS — U071 COVID-19: Secondary | ICD-10-CM | POA: Diagnosis not present

## 2021-11-27 DIAGNOSIS — R059 Cough, unspecified: Secondary | ICD-10-CM | POA: Insufficient documentation

## 2021-11-27 DIAGNOSIS — Z792 Long term (current) use of antibiotics: Secondary | ICD-10-CM | POA: Insufficient documentation

## 2021-11-27 LAB — RESP PANEL BY RT-PCR (FLU A&B, COVID) ARPGX2
Influenza A by PCR: NEGATIVE
Influenza B by PCR: NEGATIVE
SARS Coronavirus 2 by RT PCR: POSITIVE — AB

## 2021-11-27 MED ORDER — BENZONATATE 200 MG PO CAPS: 200.0000 mg | ORAL_CAPSULE | Freq: Three times a day (TID) | ORAL | 0 refills | Status: AC | PRN

## 2021-11-27 MED ORDER — CEFDINIR 300 MG PO CAPS: 300.0000 mg | ORAL_CAPSULE | Freq: Two times a day (BID) | ORAL | 0 refills | Status: AC

## 2021-11-27 MED ORDER — PREDNISONE 10 MG (21) PO TBPK: ORAL_TABLET | Freq: Every day | ORAL | 0 refills | Status: DC

## 2021-11-27 NOTE — ED Provider Notes (Signed)
Paul [facility]    CSN: [id number] Arrival date & time: 11/27/21  1154      History   Chief Complaint Chief Complaint  Patient presents with   Shortness of Breath   Cough    HPI Paul Hardy is a 61 y.o. male.   HPI Pleasant 61 year old male presents with shortness of breath/lung issues for 2 days.  Patient was evaluated in [facility] ED [place] on Thursday in which CT of abdomen and pelvis with IV contrast was performed to assess possible renal stone.  On this study trace right pleural effusion with right basilar and right middle lobe opacity/infiltrates noted with cardiomegaly.  These were incidental findings found on this study.  PMH significant for CHF, COPD, and CAD (s/p CABG x3).  Patient is accompanied by his wife this afternoon.  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   Alcohol use 11/26/2020   Acute hyperglycemia 11/26/2020   STEMI (ST elevation myocardial infarction) ([place]) 11/25/2020   Well adult exam 10/21/2020   ED (erectile dysfunction) 09/23/2020   Gastroesophageal reflux disease without esophagitis 09/23/2020   Chronic kidney disease (CKD), stage III (moderate) ([place]) 09/24/2018   Hemochromatosis 09/24/2018   Essential hypertension 09/12/2016    Past Surgical History:  Procedure Laterality Date   CARDIAC SURGERY     CHOLECYSTECTOMY, LAPAROSCOPIC     VASECTOMY         Home Medications    Prior to Admission medications   Medication Sig Start Date End Date Taking? Authorizing Provider  benzonatate (TESSALON) 200 MG capsule Take 1 capsule (200 mg total) by mouth 3  (three) times daily as needed for up to 7 days. 11/27/21 12/04/21 Yes Eliezer Lofts, FNP  cefdinir (OMNICEF) 300 MG capsule Take 1 capsule (300 mg total) by mouth 2 (two) times daily for 10 days. 11/27/21 12/07/21 Yes Eliezer Lofts, FNP  predniSONE (STERAPRED UNI-PAK 21 TAB) 10 MG (21) TBPK tablet Take by mouth daily. Take 6 tabs by mouth daily  for 2 days, then 5 tabs for 2 days, then 4 tabs for 2 days, then 3 tabs for 2 days, 2 tabs for 2 days, then 1 tab by mouth daily for 2 days 11/27/21  Yes Eliezer Lofts, FNP  albuterol (VENTOLIN HFA) 108 (90 Base) MCG/ACT inhaler Inhale into the lungs. 01/07/21 01/07/22  [provider]  AMBULATORY NON FORMULARY MEDICATION Please provide incentive spirometer.  Use every 2 hours.  Dx: Post-operative state/CABG Z95.1 12/04/20   Luetta Nutting, DO  AMBULATORY NON FORMULARY MEDICATION Please provide rollator walker.  Diagnosis: Gait instability, CABG R26.81, Z95.1 12/04/20   Luetta Nutting, DO  aspirin 81 MG EC tablet Take by mouth. 12/26/20 12/26/21  [provider]  atorvastatin (LIPITOR) 80 MG tablet Take 80 mg by mouth at bedtime. 01/13/21   [provider]  clopidogrel (PLAVIX) 75 MG tablet Take 75 mg by mouth daily. 08/13/21   [provider]  DULoxetine (CYMBALTA) 30 MG capsule TAKE 1 CAPSULE BY MOUTH EVERY DAY 11/18/21   Luetta Nutting, DO  furosemide (LASIX) 40 MG tablet Take 40 mg by mouth daily. As needed 12/02/20  [provider]  KLOR-CON M20 20 MEQ tablet Take 20 mEq by mouth daily. As needed 12/02/20   [provider]  metoprolol succinate (TOPROL-XL) 50 MG 24 hr tablet Take 50 mg by mouth daily. 04/05/21   [provider]  Multiple Vitamin (MULTIVITAMIN) tablet Take 1 tablet by mouth daily.    [provider]  nitroGLYCERIN (NITROSTAT) 0.4 MG SL tablet Place under the tongue. 12/25/20   [provider]  omeprazole (PRILOSEC) 40 MG capsule Take 40 mg by mouth daily. 06/25/21    [provider]  ondansetron (ZOFRAN ODT) 4 MG disintegrating tablet Take 1 tablet (4 mg total) by mouth every 8 (eight) hours as needed for nausea or vomiting. 12/04/20   Luetta Nutting, DO  ranolazine (RANEXA) 500 MG 12 hr tablet Take 500 mg by mouth 2 (two) times daily.    [provider]  sildenafil (REVATIO) 20 MG tablet Take as few as 2 and as many as 5 by mouth at least one hour prior to sex 10/13/20   Luetta Nutting, DO  SYMBICORT 160-4.5 MCG/ACT inhaler INHALE 2 PUFFS INTO THE LUNGS IN THE MORNING AND AT BEDTIME 11/19/21   Luetta Nutting, DO  terbinafine (LAMISIL) 250 MG tablet Take 1 tablet (250 mg total) by mouth daily. After completion of first month of medication, repeat blood work. 11/25/21   Bronson Ing, DPM    Family History Family History  Problem Relation Age of Onset   Cancer Mother    Diabetes Father    Heart Problems Father     Social History Social History   Tobacco Use   Smoking status: Former    Packs/day: 1.00    Years: 20.00    Total pack years: 20.00    Types: Cigarettes    Start date: 02/21/1998    Quit date: 11/24/2020    Years since quitting: 1.0    Passive exposure: Never   Smokeless tobacco: Never  Vaping Use   Vaping Use: Never used  Substance Use Topics   Alcohol use: Not Currently   Drug use: Never     Allergies   Bee venom, Iodinated contrast media, Hydrochlorothiazide, and Lisinopril   Review of Systems Review of Systems  Respiratory:  Positive for cough and shortness of breath.   All other systems reviewed and are negative.    Physical Exam Triage Vital Signs ED Triage Vitals  Enc Vitals Group     BP      Pulse      Resp      Temp      Temp src      SpO2      Weight      Height      Head Circumference      Peak Flow      Pain Score      Pain Loc      Pain Edu?      Excl. in [place]?    No data found.  Updated Vital Signs BP 108/86 (BP Location: Left Arm)   Pulse 69   Temp 99.6 F (37.6 C)  (Oral)   Resp (!) 24   Ht 6\' 1"  (1.854 m)   Wt 205 lb (93 kg)   SpO2 98%   BMI 27.05 kg/m      Physical Exam Vitals and nursing note reviewed.  Constitutional:      General: He is not in acute distress.    Appearance: Normal appearance. He is normal weight.  HENT:     Head: Normocephalic and atraumatic.     Mouth/Throat:     Mouth: Mucous membranes are moist.     Pharynx: Oropharynx is clear.  Eyes:     Extraocular Movements: Extraocular movements intact.     Conjunctiva/sclera: Conjunctivae normal.     Pupils: Pupils are equal, round, and reactive to light.  Cardiovascular:     Rate and Rhythm: Normal rate and regular rhythm.     Pulses: Normal pulses.     Heart sounds: Normal heart sounds.  Pulmonary:     Effort: Pulmonary effort is normal.     Breath sounds: Normal breath sounds. No wheezing, rhonchi or rales.     Comments: Diminished air intake noted over bases, infrequent nonproductive cough noted Musculoskeletal:        General: Normal range of motion.     Cervical back: Normal range of motion and neck supple.  Skin:    General: Skin is warm and dry.  Neurological:     General: No focal deficit present.     Mental Status: He is alert and oriented to person, place, and time.      UC Treatments / Results  Labs (all labs ordered are listed, but only abnormal results are displayed) Labs Reviewed  RESP PANEL BY RT-PCR (FLU A&B, COVID) [id number]    EKG   Radiology DG Chest 2 View  Result Date: 11/27/2021 CLINICAL DATA:  Shortness of breath with cough and fever. EXAM: CHEST - 2 VIEW COMPARISON:  10/07/2008 chest radiograph.  02/04/2021 CT FINDINGS: UPPER limits normal heart size and CABG changes noted. Mild LEFT basilar scarring present. There is no evidence of focal airspace disease, pulmonary edema, suspicious pulmonary nodule/mass, pleural effusion, or pneumothorax. No acute bony abnormalities are identified. IMPRESSION: No active cardiopulmonary disease.  Electronically Signed   By: Margarette Canada M.D.   On: 11/27/2021 12:34    Procedures Procedures (including critical care time)  Medications Ordered in UC Medications - No data to display  Initial Impression / Assessment and Plan / UC Course  I have reviewed the triage vital signs and the nursing notes.  Pertinent labs & imaging results that were available during my care of the patient were reviewed by me and considered in my medical decision making (see chart for details).     MDM: 1.  Shortness of breath-CXR revealed above, Rx'd cefdinir; 2.  Cough-Rx'd Sterapred Unipak, Tessalon; 3. Fever-advised OTC Tylenol 1000 to 4000 mg daily, as needed, lab 10093 ordered Advised patient of chest x-ray results with hard copy provided to patient.  Advised patient to take medications as directed with food to completion.  Advised patient to take Sterapred Unipak with first dose of cefdinir for the next 10 days.  Advised may use Tessalon daily or as needed for cough.  Advised patient may take OTC Tylenol 1000 to 4000 mg daily, as needed for fever.  Advised we will follow-up with COVID-19/Influenza a lab results once received.  Encourage patient to increase daily water intake to 64 ounces per day while taking these medications.  Advised if symptoms worsen and/or unresolved please follow-up with PCP or here for further evaluation.  Patient discharged home, hemodynamically stable. Final Clinical Impressions(s) / UC Diagnoses   Final diagnoses:  Shortness of breath  Fever, unspecified  Cough, unspecified type     Discharge Instructions      Advised patient of chest x-ray results with hard copy provided to patient.  Advised patient to take medications  as directed with food to completion.  Advised patient to take Sterapred Unipak with first dose of cefdinir for the next 10 days.  Advised may use Tessalon daily or as needed for cough.  Advised patient may take OTC Tylenol 1000 to 4000 mg daily, as needed for  fever.  Advised we will follow-up with COVID-19/Influenza a lab results once received.  Encourage patient to increase daily water intake to 64 ounces per day while taking these medications.  Advised if symptoms worsen and/or unresolved please follow-up with PCP or here for further evaluation.      ED Prescriptions     Medication Sig Dispense Auth. Provider   cefdinir (OMNICEF) 300 MG capsule Take 1 capsule (300 mg total) by mouth 2 (two) times daily for 10 days. 20 capsule Eliezer Lofts, FNP   predniSONE (STERAPRED UNI-PAK 21 TAB) 10 MG (21) TBPK tablet Take by mouth daily. Take 6 tabs by mouth daily  for 2 days, then 5 tabs for 2 days, then 4 tabs for 2 days, then 3 tabs for 2 days, 2 tabs for 2 days, then 1 tab by mouth daily for 2 days 42 tablet Eliezer Lofts, FNP   benzonatate (TESSALON) 200 MG capsule Take 1 capsule (200 mg total) by mouth 3 (three) times daily as needed for up to 7 days. 40 capsule Eliezer Lofts, FNP      PDMP not reviewed this encounter.   Eliezer Lofts, [place] 11/27/21 1325

## 2021-11-27 NOTE — Discharge Instructions (Addendum)
Advised patient of chest x-ray results with hard copy provided to patient.  Advised patient to take medications as directed with food to completion.  Advised patient to take Sterapred Unipak with first dose of cefdinir for the next 10 days.  Advised may use Tessalon daily or as needed for cough.  Advised patient may take OTC Tylenol 1000 to 4000 mg daily, as needed for fever.  Advised we will follow-up with COVID-19/Influenza a lab results once received.  Encourage patient to increase daily water intake to 64 ounces per day while taking these medications.  Advised if symptoms worsen and/or unresolved please follow-up with PCP or here for further evaluation.

## 2021-11-27 NOTE — ED Triage Notes (Signed)
Pt presents to Urgent Care with c/o shortness of breath. Recent ED visit w/ questionable pneumonia. Reports onset of fever 4 days ago and has a cough.

## 2021-11-29 ENCOUNTER — Ambulatory Visit: Payer: No Typology Code available for payment source | Admitting: Pulmonary Disease

## 2021-11-29 NOTE — Telephone Encounter (Signed)
Transition Care Management Unsuccessful Follow-up Telephone Call  Date of discharge and from where:  11/25/21 from [facility]  Attempts:  2nd Attempt  Reason for unsuccessful TCM follow-up call:  No answer/busy

## 2021-12-01 NOTE — Telephone Encounter (Signed)
Transition Care Management Unsuccessful Follow-up Telephone Call  Date of discharge and from where:  11/25/21 from [facility]  Attempts:  3rd Attempt  Reason for unsuccessful TCM follow-up call:  No answer/busy

## 2021-12-07 DIAGNOSIS — G4733 Obstructive sleep apnea (adult) (pediatric): Secondary | ICD-10-CM | POA: Insufficient documentation

## 2021-12-09 ENCOUNTER — Ambulatory Visit (INDEPENDENT_AMBULATORY_CARE_PROVIDER_SITE_OTHER): Payer: No Typology Code available for payment source | Admitting: Family Medicine

## 2021-12-09 ENCOUNTER — Encounter: Payer: Self-pay | Admitting: Family Medicine

## 2021-12-09 DIAGNOSIS — F4322 Adjustment disorder with anxiety: Secondary | ICD-10-CM

## 2021-12-09 DIAGNOSIS — N2889 Other specified disorders of kidney and ureter: Secondary | ICD-10-CM | POA: Diagnosis not present

## 2021-12-09 DIAGNOSIS — N281 Cyst of kidney, acquired: Secondary | ICD-10-CM | POA: Insufficient documentation

## 2021-12-09 DIAGNOSIS — U071 COVID-19: Secondary | ICD-10-CM | POA: Diagnosis not present

## 2021-12-09 DIAGNOSIS — I213 ST elevation (STEMI) myocardial infarction of unspecified site: Secondary | ICD-10-CM

## 2021-12-09 NOTE — Assessment & Plan Note (Signed)
Still with some fatigue and dyspnea.  Followed by cardiology.  Stable with current medications.

## 2021-12-09 NOTE — Assessment & Plan Note (Signed)
Some improvement with cymbalta.  Continue at current strength for now.

## 2021-12-09 NOTE — Assessment & Plan Note (Signed)
Recent positive PCR for COVID and respiratory symptoms consistent with this. Improving but still with mild rib pain.  He will let me know if worsening.

## 2021-12-09 NOTE — Assessment & Plan Note (Signed)
Followed by urology.  Has upcoming CT for further evaluation of mass.

## 2021-12-09 NOTE — Progress Notes (Signed)
Paul Hardy - 61 y.o. male MRN [id number]  Date of birth: 1960/09/29  Subjective Chief Complaint  Patient presents with   cymbalta follow up   covid follow up    Pt states still having  chest congestion and painful to breathe-  Just of note- pt has CT scan scheduled for tomorrow 12/10/21 - for   enlarged ventricle in hear and enlarged prostate . - urologist started him on Flomax .    HPI Paul Hardy is a 61 y.o. male here today for follow up visit.   Recently had respiratory illness.  Seen at urgent care.  Omnicef and prednisone given.  Respiratory virus panel ordered and tested positive for COVID.  Reports that overall he is feeling better, however still has some pain in his ribs bilaterally.  Denies fever, chills.   Had consult for inspire device to treat OSA.  They are evaluating him to see if he may be a candidate.   Seeing urology and has upcoming CT as a renal lesion was noted or previous imaging.   Weaned from lexapro at last visit and cymbalta started.  Reports that this maybe has helped some.  Sleeping a little better.  Denies side effects from medication.    ROS:  A comprehensive ROS was completed and negative except as noted per HPI    Allergies  Allergen Reactions   Bee Venom Anaphylaxis   Iodinated Contrast Media Anaphylaxis    Rash and Shortness of Breath   Hydrochlorothiazide Other (See Comments)    Dehydration and muscle spasms. Lasix does the same   Lisinopril Cough    Losartan is OK    Past Medical History:  Diagnosis Date   CHF (congestive heart failure) (HCC)     Past Surgical History:  Procedure Laterality Date   CARDIAC SURGERY     CHOLECYSTECTOMY, LAPAROSCOPIC     VASECTOMY      Social History   Socioeconomic History   Marital status: Married    Spouse name: Not on file   Number of children: Not on file   Years of education: Not on file   Highest education level: Not on file  Occupational History   Not on file  Tobacco Use    Smoking status: Former    Packs/day: 1.00    Years: 20.00    Total pack years: 20.00    Types: Cigarettes    Start date: 02/21/1998    Quit date: 11/24/2020    Years since quitting: 1.0    Passive exposure: Never   Smokeless tobacco: Never  Vaping Use   Vaping Use: Never used  Substance and Sexual Activity   Alcohol use: Not Currently   Drug use: Never   Sexual activity: Not on file  Other Topics Concern   Not on file  Social History Narrative   Not on file   Social Determinants of Health   Financial Resource Strain: Not on file  Food Insecurity: Not on file  Transportation Needs: Not on file  Physical Activity: Not on file  Stress: Not on file  Social Connections: Not on file    Family History  Problem Relation Age of Onset   Cancer Mother    Diabetes Father    Heart Problems Father     Health Maintenance  Topic Date Due   COVID-19 Vaccine (5 - Moderna risk series) 09/26/2020   Hepatitis C Screening  06/29/2022 (Originally 08/20/1978)   HIV Screening  06/29/2022 (Originally 08/20/1975)   Fecal DNA (Cologuard)  11/04/2023   TETANUS/TDAP  06/07/2029   INFLUENZA VACCINE  Completed   Zoster Vaccines- Shingrix  Completed   HPV VACCINES  Aged Out     ----------------------------------------------------------------------------------------------------------------------------------------------------------------------------------------------------------------- Physical Exam BP 105/71   Pulse 61   Ht 6\' 1"  (1.854 m)   Wt 213 lb 4 oz (96.7 kg)   SpO2 97%   BMI 28.13 kg/m   Physical Exam Constitutional:      Appearance: Normal appearance.  Eyes:     General: No scleral icterus. Cardiovascular:     Rate and Rhythm: Normal rate and regular rhythm.  Pulmonary:     Effort: Pulmonary effort is normal. No respiratory distress.     Breath sounds: Normal breath sounds. No stridor. No wheezing.  Neurological:     Mental Status: He is alert.  Psychiatric:        Mood  and Affect: Mood normal.        Behavior: Behavior normal.     ------------------------------------------------------------------------------------------------------------------------------------------------------------------------------------------------------------------- Assessment and Plan  Adjustment disorder Some improvement with cymbalta.  Continue at current strength for now.    STEMI (ST elevation myocardial infarction) ([place]) Still with some fatigue and dyspnea.  Followed by cardiology.  Stable with current medications.   COVID Recent positive PCR for COVID and respiratory symptoms consistent with this. Improving but still with mild rib pain.  He will let me know if worsening.   Renal mass Followed by urology.  Has upcoming CT for further evaluation of mass.    No orders of the defined types were placed in this encounter.   Return in about 3 months (around 03/11/2022) for HTN/mood.    This visit occurred during the SARS-CoV-2 public health emergency.  Safety protocols were in place, including screening questions prior to the visit, additional usage of staff PPE, and extensive cleaning of exam room while observing appropriate contact time as indicated for disinfecting solutions.

## 2021-12-17 ENCOUNTER — Other Ambulatory Visit: Payer: Self-pay | Admitting: Family Medicine

## 2021-12-18 ENCOUNTER — Other Ambulatory Visit: Payer: Self-pay | Admitting: Family Medicine

## 2021-12-20 ENCOUNTER — Other Ambulatory Visit: Payer: Self-pay | Admitting: Family Medicine

## 2021-12-21 ENCOUNTER — Telehealth: Payer: No Typology Code available for payment source | Admitting: Family Medicine

## 2021-12-27 ENCOUNTER — Encounter: Payer: Self-pay | Admitting: Primary Care

## 2021-12-27 ENCOUNTER — Ambulatory Visit (INDEPENDENT_AMBULATORY_CARE_PROVIDER_SITE_OTHER): Payer: No Typology Code available for payment source | Admitting: Primary Care

## 2021-12-27 VITALS — BP 112/78 | HR 73 | Temp 97.9°F | Ht 73.0 in | Wt 221.8 lb

## 2021-12-27 DIAGNOSIS — G4733 Obstructive sleep apnea (adult) (pediatric): Secondary | ICD-10-CM

## 2021-12-27 LAB — HEPATIC FUNCTION PANEL
AG Ratio: 2 (calc) (ref 1.0–2.5)
ALT: 23 U/L (ref 9–46)
AST: 18 U/L (ref 10–35)
Albumin: 4.1 g/dL (ref 3.6–5.1)
Alkaline phosphatase (APISO): 85 U/L (ref 35–144)
Bilirubin, Direct: 0.2 mg/dL (ref 0.0–0.2)
Globulin: 2.1 g/dL (calc) (ref 1.9–3.7)
Indirect Bilirubin: 0.6 mg/dL (calc) (ref 0.2–1.2)
Total Bilirubin: 0.8 mg/dL (ref 0.2–1.2)
Total Protein: 6.2 g/dL (ref 6.1–8.1)

## 2021-12-27 MED ORDER — FLUTICASONE-SALMETEROL 250-50 MCG/ACT IN AEPB: 1.0000 | INHALATION_SPRAY | Freq: Two times a day (BID) | RESPIRATORY_TRACT | 3 refills | Status: DC

## 2021-12-27 NOTE — Patient Instructions (Addendum)
Sleep study showed moderate OSA, recommend repeating  HST to re-assess If you still have sleep apnea and it is moderate-severe sleep apnea - we will refer you to Dr. Jenne Pane with ENT for [facility] evaluation  Continue to focus on side sleeping position and weight loss as able   Follow-up: Call 1-2 weeks after sleep study for results and referral if needed   Sleep Apnea Sleep apnea is a condition in which breathing pauses or becomes shallow during sleep. People with sleep apnea usually snore loudly. They may have times when they gasp and stop breathing for 10 seconds or more during sleep. This may happen many times during the night. Sleep apnea disrupts your sleep and keeps your body from getting the rest that it needs. This condition can increase your risk of certain health problems, including: Heart attack. Stroke. Obesity. Type 2 diabetes. Heart failure. Irregular heartbeat. High blood pressure. The goal of treatment is to help you breathe normally again. What are the causes?  The most common cause of sleep apnea is a collapsed or blocked airway. There are three kinds of sleep apnea: Obstructive sleep apnea. This kind is caused by a blocked or collapsed airway. Central sleep apnea. This kind happens when the part of the brain that controls breathing does not send the correct signals to the muscles that control breathing. Mixed sleep apnea. This is a combination of obstructive and central sleep apnea. What increases the risk? You are more likely to develop this condition if you: Are overweight. Smoke. Have a smaller than normal airway. Are older. Are male. Drink alcohol. Take sedatives or tranquilizers. Have a family history of sleep apnea. Have a tongue or tonsils that are larger than normal. What are the signs or symptoms? Symptoms of this condition include: Trouble staying asleep. Loud snoring. Morning headaches. Waking up gasping. Dry mouth or sore throat in the  morning. Daytime sleepiness and tiredness. If you have daytime fatigue because of sleep apnea, you may be more likely to have: Trouble concentrating. Forgetfulness. Irritability or mood swings. Personality changes. Feelings of depression. Sexual dysfunction. This may include loss of interest if you are male, or erectile dysfunction if you are male. How is this diagnosed? This condition may be diagnosed with: A medical history. A physical exam. A series of tests that are done while you are sleeping (sleep study). These tests are usually done in a sleep lab, but they may also be done at home. How is this treated? Treatment for this condition aims to restore normal breathing and to ease symptoms during sleep. It may involve managing health issues that can affect breathing, such as high blood pressure or obesity. Treatment may include: Sleeping on your side. Using a decongestant if you have nasal congestion. Avoiding the use of depressants, including alcohol, sedatives, and narcotics. Losing weight if you are overweight. Making changes to your diet. Quitting smoking. Using a device to open your airway while you sleep, such as: An oral appliance. This is a custom-made mouthpiece that shifts your lower jaw forward. A continuous positive airway pressure (CPAP) device. This device blows air through a mask when you breathe out (exhale). A nasal expiratory positive airway pressure (EPAP) device. This device has valves that you put into each nostril. A bi-level positive airway pressure (BIPAP) device. This device blows air through a mask when you breathe in (inhale) and breathe out (exhale). Having surgery if other treatments do not work. During surgery, excess tissue is removed to create a wider airway.  Follow these instructions at home: Lifestyle Make any lifestyle changes that your health care provider recommends. Eat a healthy, well-balanced diet. Take steps to lose weight if you are  overweight. Avoid using depressants, including alcohol, sedatives, and narcotics. Do not use any products that contain nicotine or tobacco. These products include cigarettes, chewing tobacco, and vaping devices, such as e-cigarettes. If you need help quitting, ask your health care provider. General instructions Take over-the-counter and prescription medicines only as told by your health care provider. If you were given a device to open your airway while you sleep, use it only as told by your health care provider. If you are having surgery, make sure to tell your health care provider you have sleep apnea. You may need to bring your device with you. Keep all follow-up visits. This is important. Contact a health care provider if: The device that you received to open your airway during sleep is uncomfortable or does not seem to be working. Your symptoms do not improve. Your symptoms get worse. Get help right away if: You develop: Chest pain. Shortness of breath. Discomfort in your back, arms, or stomach. You have: Trouble speaking. Weakness on one side of your body. Drooping in your face. These symptoms may represent a serious problem that is an emergency. Do not wait to see if the symptoms will go away. Get medical help right away. Call your local emergency services (911 in the [place] Do not drive yourself to the hospital. Summary Sleep apnea is a condition in which breathing pauses or becomes shallow during sleep. The most common cause is a collapsed or blocked airway. The goal of treatment is to restore normal breathing and to ease symptoms during sleep. This information is not intended to replace advice given to you by your health care provider. Make sure you discuss any questions you have with your health care provider. Document Revised: 09/16/2020 Document Reviewed: 01/17/2020 Elsevier Patient Education  [employer].

## 2021-12-27 NOTE — Assessment & Plan Note (Addendum)
-   Moderate OSA, he is intolerant to CPAP - Sleeps well at night without issues - Interested in [place]  - Needs repeat HST to re-assess degree OSA before referral to ENT

## 2021-12-27 NOTE — Progress Notes (Signed)
@Patient  ID: Paul Hardy, male    DOB: 04/06/1960, 61 y.o.   MRN: [id number]  Chief Complaint  Patient presents with   Follow-up    Referring provider: Luetta Nutting, DO  HPI: 61 year old ex-smoker with coronary artery disease Suffered cardiac arrest in October 2022 requiring triple bypass surgery.  He has had intermittent shortness of breath since then and was told that this is secondary to Brilinta.  He has had a pulmonary evaluation including PFTs which show some restriction and diffusion impairment.  Chest x-ray with basilar atelectasis with a VQ scan at [facility] ED with low probability of pulmonary embolism.  Continues follow-up with cardiology at [place] for ischemic cardiomyopathy with EF 40-45%.    Pets: Has dogs, cats Occupation: Used to work as a Airline pilot, Audiological scientist.  Currently a truck driver Exposures: No mold, hot tub, Jacuzzi.  No feather pillows or comforter Smoking history: 60-pack-year smoker.  Quit in October 2022 Travel history: No significant travel history Relevant family history: No family history of lung disease  Interim history: Started on Symbicort at last visit.  He states that he notices some mild improvement but continues to have dyspnea Has been ordered CPAP AutoSet with nasal pillow but is finding it difficult to use as he is a mouth breather.  He needs facemask ordered through [facility] as [facility] does not have the supplies  12/27/2021- Interim hx  Patient presents today to discuss Inspire device. Patient had a polysomnogram on 03/11/2018, had difficulty initiating and maintaining sleep.  No significant limb movements.  Mild snoring. Overall AHI 18.3/hr. Cardiologist had some concerns that sleep studies were completed to soon after having coronary artery bypass graft.  He is intolerant to CPAP.  He is interested in inspire device. Overall sleeping ok.    Allergies  Allergen Reactions   Bee Venom Anaphylaxis   Iodinated Contrast Media  Anaphylaxis    Rash and Shortness of Breath   Hydrochlorothiazide Other (See Comments)    Dehydration and muscle spasms. Lasix does the same   Lisinopril Cough    Losartan is OK    Immunization History  Administered Date(s) Administered   Influenza Inj Mdck Quad Pf 12/02/2016   Influenza, Quadrivalent, Recombinant, Inj, Pf 11/24/2018   Influenza, Seasonal, Injecte, Preservative Fre 11/24/2014, 12/02/2016   Influenza,inj,Quad PF,6+ Mos 12/04/2020   Influenza,inj,quad, With Preservative 12/02/2016   Influenza-Unspecified 11/22/2021   Moderna Sars-Covid-2 Vaccination 04/01/2019, 04/29/2019, 02/18/2020, 08/01/2020   Tdap 09/07/2013, 06/08/2019   Unspecified SARS-COV-2 Vaccination 11/22/2021   Zoster Recombinat (Shingrix) 09/19/2016, 01/21/2017   Zoster, Live 09/19/2016    Past Medical History:  Diagnosis Date   CHF (congestive heart failure) ([place])     Tobacco History: Social History   Tobacco Use  Smoking Status Former   Packs/day: 1.00   Years: 20.00   Total pack years: 20.00   Types: Cigarettes   Start date: 02/21/1998   Quit date: 11/24/2020   Years since quitting: 1.0   Passive exposure: Never  Smokeless Tobacco Never   Counseling given: Not Answered   Outpatient Medications Prior to Visit  Medication Sig Dispense Refill   albuterol (VENTOLIN HFA) 108 (90 Base) MCG/ACT inhaler Inhale into the lungs.     AMBULATORY NON FORMULARY MEDICATION Please provide incentive spirometer.  Use every 2 hours.  Dx: Post-operative state/CABG Z95.1 1 Device 0   AMBULATORY NON FORMULARY MEDICATION Please provide rollator walker.  Diagnosis: Gait instability, CABG R26.81, Z95.1 1 Device 0   atorvastatin (LIPITOR) 80 MG tablet Take  80 mg by mouth at bedtime.     clopidogrel (PLAVIX) 75 MG tablet Take 75 mg by mouth daily.     DTx App - Sleep (SLEEPIO/DAYLIGHT APP BUNDLE) MISC      DULoxetine (CYMBALTA) 30 MG capsule TAKE 1 CAPSULE BY MOUTH EVERY DAY 90 capsule 2   EPINEPHrine 0.3  mg/0.3 mL IJ SOAJ injection Inject as directed See admin instructions.     furosemide (LASIX) 40 MG tablet Take 40 mg by mouth daily. As needed     KLOR-CON M20 20 MEQ tablet Take 20 mEq by mouth daily. As needed     metoprolol succinate (TOPROL-XL) 50 MG 24 hr tablet TAKE 1 TABLET BY MOUTH EVERY DAY 90 tablet 1   Multiple Vitamin (MULTIVITAMIN) tablet Take 1 tablet by mouth daily.     nitroGLYCERIN (NITROSTAT) 0.4 MG SL tablet Place under the tongue.     omeprazole (PRILOSEC) 40 MG capsule Take 40 mg by mouth daily.     ondansetron (ZOFRAN ODT) 4 MG disintegrating tablet Take 1 tablet (4 mg total) by mouth every 8 (eight) hours as needed for nausea or vomiting. 20 tablet 0   predniSONE (STERAPRED UNI-PAK 21 TAB) 10 MG (21) TBPK tablet Take by mouth daily. Take 6 tabs by mouth daily  for 2 days, then 5 tabs for 2 days, then 4 tabs for 2 days, then 3 tabs for 2 days, 2 tabs for 2 days, then 1 tab by mouth daily for 2 days 42 tablet 0   ranolazine (RANEXA) 500 MG 12 hr tablet Take 500 mg by mouth 2 (two) times daily.     sildenafil (REVATIO) 20 MG tablet Take as few as 2 and as many as 5 by mouth at least one hour prior to sex 30 tablet 5   tamsulosin (FLOMAX) 0.4 MG CAPS capsule Take by mouth.     terbinafine (LAMISIL) 250 MG tablet Take 1 tablet (250 mg total) by mouth daily. After completion of first month of medication, repeat blood work. 30 tablet 2   SYMBICORT 160-4.5 MCG/ACT inhaler INHALE 2 PUFFS INTO THE LUNGS IN THE MORNING AND AT BEDTIME 30.6 each 1   No facility-administered medications prior to visit.    Review of Systems  Review of Systems  Constitutional: Negative.   HENT: Negative.    Respiratory: Negative.      Physical Exam  BP 112/78 (BP Location: Left Arm, Patient Position: Sitting, Cuff Size: Normal)   Pulse 73   Temp 97.9 F (36.6 C) (Oral)   Ht 6\' 1"  (1.854 m)   Wt 221 lb 12.8 oz (100.6 kg)   SpO2 100%   BMI 29.26 kg/m  Physical Exam Constitutional:       Appearance: Normal appearance.  HENT:     Head: Normocephalic and atraumatic.     Mouth/Throat:     Comments: Mallampati class II-III Cardiovascular:     Rate and Rhythm: Normal rate and regular rhythm.  Pulmonary:     Effort: Pulmonary effort is normal.     Breath sounds: Normal breath sounds.  Musculoskeletal:        General: Normal range of motion.  Skin:    General: Skin is warm and dry.  Neurological:     General: No focal deficit present.     Mental Status: He is alert and oriented to person, place, and time. Mental status is at baseline.      Lab Results:  CBC    Component Value Date/Time  WBC 9.2 04/23/2021 0000   RBC 5.37 04/23/2021 0000   HGB 15.5 04/23/2021 0000   HCT 47.3 04/23/2021 0000   PLT 230 04/23/2021 0000   MCV 88.1 04/23/2021 0000   MCH 28.9 04/23/2021 0000   MCHC 32.8 04/23/2021 0000   RDW 12.4 04/23/2021 0000   LYMPHSABS 3,128 04/23/2021 0000   MONOABS 0.5 10/07/2008 1825   EOSABS 202 04/23/2021 0000   BASOSABS 64 04/23/2021 0000    BMET    Component Value Date/Time   NA 139 04/23/2021 0000   K 4.4 04/23/2021 0000   CL 102 04/23/2021 0000   CO2 28 04/23/2021 0000   GLUCOSE 116 (H) 04/23/2021 0000   BUN 21 04/23/2021 0000   CREATININE 1.39 (H) 04/23/2021 0000   CALCIUM 9.1 04/23/2021 0000    BNP    Component Value Date/Time   BNP 24 04/23/2021 0000    ProBNP No results found for: "PROBNP"  Imaging: DG Chest 2 View  Result Date: 11/27/2021 CLINICAL DATA:  Shortness of breath with cough and fever. EXAM: CHEST - 2 VIEW COMPARISON:  10/07/2008 chest radiograph.  02/04/2021 CT FINDINGS: UPPER limits normal heart size and CABG changes noted. Mild LEFT basilar scarring present. There is no evidence of focal airspace disease, pulmonary edema, suspicious pulmonary nodule/mass, pleural effusion, or pneumothorax. No acute bony abnormalities are identified. IMPRESSION: No active cardiopulmonary disease. Electronically Signed   By: Margarette Canada M.D.   On: 11/27/2021 12:34     Assessment & Plan:   OSA on CPAP - Moderate OSA, he is intolerant to CPAP - Sleeps well at night without issues - Interested in [place] device  - Needs repeat HST to re-assess degree OSA before referral to ENT     Martyn Ehrich, NP 12/27/2021

## 2022-01-14 ENCOUNTER — Ambulatory Visit
Admission: EM | Admit: 2022-01-14 | Discharge: 2022-01-14 | Disposition: A | Discharge status: Home / Self Care | Payer: No Typology Code available for payment source | Attending: Family Medicine | Admitting: Family Medicine

## 2022-01-14 DIAGNOSIS — J069 Acute upper respiratory infection, unspecified: Secondary | ICD-10-CM

## 2022-01-14 MED ORDER — PREDNISONE 20 MG PO TABS: ORAL_TABLET | ORAL | 0 refills | Status: DC

## 2022-01-14 MED ORDER — DOXYCYCLINE HYCLATE 100 MG PO CAPS: ORAL_CAPSULE | ORAL | 0 refills | Status: DC

## 2022-01-14 NOTE — ED Triage Notes (Signed)
Pt states that he has a cough, chest congestion, fatigue and sore throat. X2 days

## 2022-01-14 NOTE — ED Provider Notes (Signed)
[name]    CSN: [id number] Arrival date & time: 01/14/22  1215      History   Chief Complaint Chief Complaint  Patient presents with   Cough    Cough, chest congestion, fatigue and sore throat. X2 days    HPI Paul Hardy is a 61 y.o. male.   Patient complains of tw0 day history of typical cold-like symptoms, including mild sore throat, sinus congestion, low grade fever, fatigue, and cough.  He has COPD and complains of increased wheezing.  He uses a Symbicort inhaler for maintenance.  He denies pleuritic pain.  The history is provided by the patient.    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 Authorizing Provider  AMBULATORY NON FORMULARY MEDICATION Please provide incentive spirometer.  Use every 2 hours.  Dx: Post-operative state/CABG Z95.1 12/04/20  Yes Everrett Coombe, DO  AMBULATORY NON FORMULARY MEDICATION Please provide rollator walker.  Diagnosis: Gait  instability, CABG R26.81, Z95.1 12/04/20  Yes Everrett Coombe, DO  atorvastatin (LIPITOR) 80 MG tablet Take 80 mg by mouth at bedtime. 01/13/21  Yes [provider]  clopidogrel (PLAVIX) 75 MG tablet Take 75 mg by mouth daily. 08/13/21  Yes [provider]  doxycycline (VIBRAMYCIN) 100 MG capsule Take one cap PO Q12hr with food. 01/14/22  Yes Lattie Haw, MD  DTx App - Sleep (SLEEPIO/DAYLIGHT APP BUNDLE) MISC  10/29/21  Yes [provider]  DULoxetine (CYMBALTA) 30 MG capsule TAKE 1 CAPSULE BY MOUTH EVERY DAY 11/18/21  Yes Everrett Coombe, DO  EPINEPHrine 0.3 mg/0.3 mL IJ SOAJ injection Inject as directed See admin instructions.   Yes [provider]  fluticasone-salmeterol (WIXELA INHUB) 250-50 MCG/ACT AEPB Inhale 1 puff into the lungs in the morning and at bedtime. (Replaced Symbicort) 12/27/21  Yes Glenford Bayley, NP  furosemide (LASIX) 40 MG tablet Take 40 mg by mouth daily. As needed 12/02/20  Yes [provider]  KLOR-CON M20 20 MEQ tablet Take 20 mEq by mouth daily. As needed 12/02/20  Yes [provider]  metoprolol succinate (TOPROL-XL) 50 MG 24 hr tablet TAKE 1 TABLET BY MOUTH EVERY DAY 12/20/21  Yes Everrett Coombe, DO  Multiple Vitamin (MULTIVITAMIN) tablet Take 1 tablet by mouth daily.   Yes [provider]  nitroGLYCERIN (NITROSTAT) 0.4 MG SL tablet Place under the tongue. 12/25/20  Yes [provider]  omeprazole (PRILOSEC) 40 MG capsule Take 40 mg by mouth daily.  06/25/21  Yes [provider]  ondansetron (ZOFRAN ODT) 4 MG disintegrating tablet Take 1 tablet (4 mg total) by mouth every 8 (eight) hours as needed for nausea or vomiting. 12/04/20  Yes Luetta Nutting, DO  predniSONE (DELTASONE) 20 MG tablet Take one tab by mouth twice daily for 4 days, then one daily for 3 days. Take with food. 01/14/22  Yes Kandra Nicolas, MD  ranolazine (RANEXA) 500 MG 12 hr tablet Take 500 mg by mouth 2 (two) times daily.    Yes [provider]  sildenafil (REVATIO) 20 MG tablet Take as few as 2 and as many as 5 by mouth at least one hour prior to sex 10/13/20  Yes Luetta Nutting, DO  tamsulosin (FLOMAX) 0.4 MG CAPS capsule Take by mouth. 11/25/21 02/23/22 Yes [provider]  terbinafine (LAMISIL) 250 MG tablet Take 1 tablet (250 mg total) by mouth daily. After completion of first month of medication, repeat blood work. 11/25/21  Yes Bronson Ing, DPM  albuterol (VENTOLIN HFA) 108 (90 Base) MCG/ACT inhaler Inhale into the lungs. 01/07/21 01/07/22  [provider]    Family History Family History  Problem Relation Age of Onset   Cancer Mother    Diabetes Father    Heart Problems Father     Social History Social History   Tobacco Use   Smoking status: Former    Packs/day: 1.00    Years: 20.00    Total pack years: 20.00    Types: Cigarettes    Start date: 02/21/1998    Quit date: 11/24/2020    Years since quitting: 1.1    Passive exposure: Never   Smokeless tobacco: Never  Vaping Use   Vaping Use: Never used  Substance Use Topics   Alcohol use: Not Currently   Drug use: Never     Allergies   Bee venom, Iodinated contrast media, Hydrochlorothiazide, and Lisinopril   Review of Systems Review of Systems + sore throat + cough No pleuritic pain + wheezing + nasal congestion + post-nasal drainage No sinus pain/pressure No itchy/red eyes No earache No hemoptysis ? SOB + low grade fever No nausea No vomiting No abdominal pain No diarrhea No urinary symptoms No skin rash + fatigue No myalgias + headache Used OTC meds (Nyquil) without relief   Physical Exam Triage Vital Signs ED Triage Vitals  Enc Vitals Group     BP 01/14/22 1333 123/83     Pulse Rate 01/14/22 1333 66     Resp 01/14/22 1333 18     Temp 01/14/22 1333 98.2 F (36.8 C)     Temp Source 01/14/22 1333 Oral     SpO2 01/14/22 1333 98 %     Weight 01/14/22 1331 210 lb (95.3 kg)      Height 01/14/22 1331 6\' 1"  (1.854 m)     Head Circumference --      Peak Flow --      Pain Score 01/14/22 1331 6     Pain Loc --      Pain Edu? --      Excl. in [place]? --    No data found.  Updated Vital Signs BP 123/83 (BP Location: Left Arm)   Pulse 66   Temp 98.2 F (36.8 C) (Oral)   Resp 18   Ht 6\' 1"  (1.854 m)   Wt 95.3 kg   SpO2 98%   BMI 27.71 kg/m   Visual Acuity Right Eye Distance:   Left Eye Distance:  Bilateral Distance:    Right Eye Near:   Left Eye Near:    Bilateral Near:     Physical Exam Nursing notes and Vital Signs reviewed. Appearance:  Patient appears stated age, and in no acute distress Eyes:  Pupils are equal, round, and reactive to light and accomodation.  Extraocular movement is intact.  Conjunctivae are not inflamed  Ears:  Canals normal.  Tympanic membranes normal.  Nose:  Mildly congested turbinates.  No sinus tenderness.  Pharynx:  Normal Neck:  Supple.  Mildly enlarged lateral nodes are present, tender to palpation on the left.   Lungs:  Clear to auscultation.  Breath sounds are equal.  Moving air well. Heart:  Regular rate and rhythm without murmurs, rubs, or gallops.  Abdomen:  Nontender without masses or hepatosplenomegaly.  Bowel sounds are present.  No CVA or flank tenderness.  Extremities:  No edema.  Skin:  No rash present.   UC Treatments / Results  Labs (all labs ordered are listed, but only abnormal results are displayed) Labs Reviewed - No data to display  EKG   Radiology No results found.  Procedures Procedures (including critical care time)  Medications Ordered in UC Medications - No data to display  Initial Impression / Assessment and Plan / UC Course  I have reviewed the triage vital signs and the nursing notes.  Pertinent labs & imaging results that were available during my care of the patient were reviewed by me and considered in my medical decision making (see chart for details).    Patient has a history  of COPD; will begin doxycycline and prednisone burst/taper. Followup with Family Doctor if not improved in one week.   Final Clinical Impressions(s) / UC Diagnoses   Final diagnoses:  Viral URI with cough     Discharge Instructions      Take plain guaifenesin (1200mg  extended release tabs such as Mucinex) twice daily, with plenty of water, for cough and congestion.  Get adequate rest.   May use Afrin nasal spray (or generic oxymetazoline) each morning for about 5 days and then discontinue.  Also recommend using saline nasal spray several times daily and saline nasal irrigation (AYR is a common brand).  Use Flonase nasal spray each morning after using Afrin nasal spray and saline nasal irrigation. Try warm salt water gargles for sore throat.  Stop all antihistamines (Nyquil, etc) for now, and other non-prescription cough/cold preparations. May take Delsym Cough Suppressant ("12 Hour Cough Relief") at bedtime for nighttime cough.  Continue Symbicort inhaler.  If symptoms become significantly worse during the night or over the weekend, proceed to the local emergency room.       ED Prescriptions     Medication Sig Dispense Auth. Provider   doxycycline (VIBRAMYCIN) 100 MG capsule Take one cap PO Q12hr with food. 14 capsule Kandra Nicolas, MD   predniSONE (DELTASONE) 20 MG tablet Take one tab by mouth twice daily for 4 days, then one daily for 3 days. Take with food. 11 tablet Kandra Nicolas, MD         Kandra Nicolas, MD 01/16/22 [phone]

## 2022-01-14 NOTE — Discharge Instructions (Signed)
Take plain guaifenesin (1200mg  extended release tabs such as Mucinex) twice daily, with plenty of water, for cough and congestion.  Get adequate rest.   May use Afrin nasal spray (or generic oxymetazoline) each morning for about 5 days and then discontinue.  Also recommend using saline nasal spray several times daily and saline nasal irrigation (AYR is a common brand).  Use Flonase nasal spray each morning after using Afrin nasal spray and saline nasal irrigation. Try warm salt water gargles for sore throat.  Stop all antihistamines (Nyquil, etc) for now, and other non-prescription cough/cold preparations. May take Delsym Cough Suppressant ("12 Hour Cough Relief") at bedtime for nighttime cough.  Continue Symbicort inhaler.  If symptoms become significantly worse during the night or over the weekend, proceed to the local emergency room.

## 2022-02-13 ENCOUNTER — Emergency Department (HOSPITAL_BASED_OUTPATIENT_CLINIC_OR_DEPARTMENT_OTHER)
Admission: EM | Admit: 2022-02-13 | Discharge: 2022-02-13 | Disposition: A | Discharge status: Home / Self Care | Payer: No Typology Code available for payment source | Attending: Emergency Medicine | Admitting: Emergency Medicine

## 2022-02-13 ENCOUNTER — Encounter (HOSPITAL_BASED_OUTPATIENT_CLINIC_OR_DEPARTMENT_OTHER): Payer: Self-pay | Admitting: Emergency Medicine

## 2022-02-13 ENCOUNTER — Emergency Department (HOSPITAL_BASED_OUTPATIENT_CLINIC_OR_DEPARTMENT_OTHER): Payer: No Typology Code available for payment source

## 2022-02-13 ENCOUNTER — Other Ambulatory Visit: Payer: Self-pay

## 2022-02-13 DIAGNOSIS — Z1152 Encounter for screening for COVID-19: Secondary | ICD-10-CM | POA: Insufficient documentation

## 2022-02-13 DIAGNOSIS — R0602 Shortness of breath: Secondary | ICD-10-CM | POA: Diagnosis present

## 2022-02-13 DIAGNOSIS — Z7901 Long term (current) use of anticoagulants: Secondary | ICD-10-CM | POA: Insufficient documentation

## 2022-02-13 DIAGNOSIS — Z8679 Personal history of other diseases of the circulatory system: Secondary | ICD-10-CM | POA: Insufficient documentation

## 2022-02-13 DIAGNOSIS — J449 Chronic obstructive pulmonary disease, unspecified: Secondary | ICD-10-CM | POA: Insufficient documentation

## 2022-02-13 DIAGNOSIS — Z951 Presence of aortocoronary bypass graft: Secondary | ICD-10-CM | POA: Insufficient documentation

## 2022-02-13 DIAGNOSIS — Z8616 Personal history of COVID-19: Secondary | ICD-10-CM | POA: Diagnosis not present

## 2022-02-13 LAB — CBC WITH DIFFERENTIAL/PLATELET
Abs Immature Granulocytes: 0.03 10*3/uL (ref 0.00–0.07)
Basophils Absolute: 0.1 10*3/uL (ref 0.0–0.1)
Basophils Relative: 0 %
Eosinophils Absolute: 0.1 10*3/uL (ref 0.0–0.5)
Eosinophils Relative: 1 %
HCT: 48.9 % (ref 39.0–52.0)
Hemoglobin: 15.9 g/dL (ref 13.0–17.0)
Immature Granulocytes: 0 %
Lymphocytes Relative: 32 %
Lymphs Abs: 3.6 10*3/uL (ref 0.7–4.0)
MCH: 29.7 pg (ref 26.0–34.0)
MCHC: 32.5 g/dL (ref 30.0–36.0)
MCV: 91.4 fL (ref 80.0–100.0)
Monocytes Absolute: 1 10*3/uL (ref 0.1–1.0)
Monocytes Relative: 9 %
Neutro Abs: 6.3 10*3/uL (ref 1.7–7.7)
Neutrophils Relative %: 58 %
Platelets: 263 10*3/uL (ref 150–400)
RBC: 5.35 MIL/uL (ref 4.22–5.81)
RDW: 13.2 % (ref 11.5–15.5)
WBC: 11.2 10*3/uL — ABNORMAL HIGH (ref 4.0–10.5)
nRBC: 0 % (ref 0.0–0.2)

## 2022-02-13 LAB — BASIC METABOLIC PANEL
Anion gap: 7 (ref 5–15)
BUN: 22 mg/dL (ref 8–23)
CO2: 28 mmol/L (ref 22–32)
Calcium: 9.4 mg/dL (ref 8.9–10.3)
Chloride: 102 mmol/L (ref 98–111)
Creatinine, Ser: 1.75 mg/dL — ABNORMAL HIGH (ref 0.61–1.24)
GFR, Estimated: 44 mL/min — ABNORMAL LOW (ref >60)
Glucose, Bld: 125 mg/dL — ABNORMAL HIGH (ref 70–99)
Potassium: 4.1 mmol/L (ref 3.5–5.1)
Sodium: 137 mmol/L (ref 135–145)

## 2022-02-13 LAB — BRAIN NATRIURETIC PEPTIDE: B Natriuretic Peptide: 25.5 pg/mL (ref 0.0–100.0)

## 2022-02-13 LAB — RESP PANEL BY RT-PCR (RSV, FLU A&B, COVID)  RVPGX2
Influenza A by PCR: NEGATIVE
Influenza B by PCR: NEGATIVE
Resp Syncytial Virus by PCR: NEGATIVE
SARS Coronavirus 2 by RT PCR: NEGATIVE

## 2022-02-13 LAB — TROPONIN I (HIGH SENSITIVITY): Troponin I (High Sensitivity): 6 ng/L (ref <18)

## 2022-02-13 NOTE — ED Triage Notes (Signed)
Pt c/o SOB x 2-3 days with some dizziness, pt reports same x 2 years with flare-ups every 2-3 weeks

## 2022-02-13 NOTE — ED Notes (Signed)
Xray tech at patient bedside. 

## 2022-02-13 NOTE — ED Provider Notes (Signed)
[facility] EMERGENCY DEPARTMENT Provider Note   CSN: [id number] Arrival date & time: 02/13/22  2056     History  Chief Complaint  Patient presents with   Shortness of Breath    Paul Hardy is a 61 y.o. male.  Patient here with shortness of breath for the last few days.  Maybe some dizziness.  He states he has had similar issues.  He is got COPD, heart failure, CAD.  He does not have any symptoms currently.  He is felt may be some shortness of breath with exertion.  Does not notice any major fluid retention.  Denies any fevers or cough or sputum production.  Denies any chest pain.  Nothing makes it worse or better.  No recent surgery or travel or blood clot history.  Denies any abdominal pain, weakness, numbness, chills.  The history is provided by the patient.       Home Medications Prior to Admission medications   Medication Sig Start Date End Date Taking? Authorizing Provider  albuterol (VENTOLIN HFA) 108 (90 Base) MCG/ACT inhaler Inhale into the lungs. 01/07/21 01/07/22  [provider]  AMBULATORY NON FORMULARY MEDICATION Please provide incentive spirometer.  Use every 2 hours.  Dx: Post-operative state/CABG Z95.1 12/04/20   Everrett Coombe, DO  AMBULATORY NON FORMULARY MEDICATION Please provide rollator walker.  Diagnosis: Gait instability, CABG R26.81, Z95.1 12/04/20   Everrett Coombe, DO  atorvastatin (LIPITOR) 80 MG tablet Take 80 mg by mouth at bedtime. 01/13/21   [provider]  clopidogrel (PLAVIX) 75 MG tablet Take 75 mg by mouth daily. 08/13/21   [provider]  doxycycline (VIBRAMYCIN) 100 MG capsule Take one cap PO Q12hr with food. 01/14/22   Lattie Haw, MD  DTx App - Sleep (SLEEPIO/DAYLIGHT APP BUNDLE) MISC  10/29/21   [provider]  DULoxetine (CYMBALTA) 30 MG capsule TAKE 1 CAPSULE BY MOUTH EVERY DAY 11/18/21   Everrett Coombe, DO  EPINEPHrine 0.3 mg/0.3 mL IJ SOAJ injection Inject as directed See admin  instructions.    [provider]  fluticasone-salmeterol (WIXELA INHUB) 250-50 MCG/ACT AEPB Inhale 1 puff into the lungs in the morning and at bedtime. (Replaced Symbicort) 12/27/21   Glenford Bayley, NP  furosemide (LASIX) 40 MG tablet Take 40 mg by mouth daily. As needed 12/02/20   [provider]  KLOR-CON M20 20 MEQ tablet Take 20 mEq by mouth daily. As needed 12/02/20   [provider]  metoprolol succinate (TOPROL-XL) 50 MG 24 hr tablet TAKE 1 TABLET BY MOUTH EVERY DAY 12/20/21   Everrett Coombe, DO  Multiple Vitamin (MULTIVITAMIN) tablet Take 1 tablet by mouth daily.    [provider]  nitroGLYCERIN (NITROSTAT) 0.4 MG SL tablet Place under the tongue. 12/25/20   [provider]  omeprazole (PRILOSEC) 40 MG capsule Take 40 mg by mouth daily. 06/25/21   [provider]  ondansetron (ZOFRAN ODT) 4 MG disintegrating tablet Take 1 tablet (4 mg total) by mouth every 8 (eight) hours as needed for nausea or vomiting. 12/04/20   Everrett Coombe, DO  predniSONE (DELTASONE) 20 MG tablet Take one tab by mouth twice daily for 4 days, then one daily for 3 days. Take with food. 01/14/22   Lattie Haw, MD  ranolazine (RANEXA) 500 MG 12 hr tablet Take 500 mg by mouth 2 (two) times daily.    [provider]  sildenafil (REVATIO) 20 MG tablet Take as few as 2 and as many as 5 by  mouth at least one hour prior to sex 10/13/20   Everrett Coombe, DO  tamsulosin (FLOMAX) 0.4 MG CAPS capsule Take by mouth. 11/25/21 02/23/22  [provider]  terbinafine (LAMISIL) 250 MG tablet Take 1 tablet (250 mg total) by mouth daily. After completion of first month of medication, repeat blood work. 11/25/21   Delories Heinz, DPM      Allergies    Bee venom, Iodinated contrast media, Hydrochlorothiazide, and Lisinopril    Review of Systems   Review of Systems  Physical Exam Updated Vital Signs BP (!) 140/104   Pulse 84   Temp 98 F (36.7 C) (Oral)    Resp 19   Ht 6\' 1"  (1.854 m)   Wt 95.3 kg   SpO2 98%   BMI 27.71 kg/m  Physical Exam Vitals and nursing note reviewed.  Constitutional:      General: He is not in acute distress.    Appearance: He is well-developed. He is not ill-appearing.  HENT:     Head: Normocephalic and atraumatic.     Mouth/Throat:     Mouth: Mucous membranes are moist.  Eyes:     Conjunctiva/sclera: Conjunctivae normal.     Pupils: Pupils are equal, round, and reactive to light.  Cardiovascular:     Rate and Rhythm: Normal rate and regular rhythm.     Pulses: Normal pulses.     Heart sounds: Normal heart sounds. No murmur heard. Pulmonary:     Effort: Pulmonary effort is normal. No respiratory distress.     Breath sounds: Normal breath sounds. No decreased breath sounds.  Abdominal:     Palpations: Abdomen is soft.     Tenderness: There is no abdominal tenderness.  Musculoskeletal:        General: No swelling.     Cervical back: Normal range of motion and neck supple.     Right lower leg: Edema present.     Left lower leg: Edema present.     Comments: Trace edema in legs bilaterally  Skin:    General: Skin is warm and dry.     Capillary Refill: Capillary refill takes less than 2 seconds.  Neurological:     Mental Status: He is alert.  Psychiatric:        Mood and Affect: Mood normal.     ED Results / Procedures / Treatments   Labs (all labs ordered are listed, but only abnormal results are displayed) Labs Reviewed  CBC WITH DIFFERENTIAL/PLATELET - Abnormal; Notable for the following components:      Result Value   WBC 11.2 (*)    All other components within normal limits  BASIC METABOLIC PANEL - Abnormal; Notable for the following components:   Glucose, Bld 125 (*)    Creatinine, Ser 1.75 (*)    GFR, Estimated 44 (*)    All other components within normal limits  RESP PANEL BY RT-PCR (RSV, FLU A&B, COVID)  [id number]  BRAIN NATRIURETIC PEPTIDE  TROPONIN I (HIGH SENSITIVITY)     EKG EKG Interpretation  Date/Time:  Sunday February 13 2022 21:24:15 EST Ventricular Rate:  98 PR Interval:  148 QRS Duration: 96 QT Interval:  360 QTC Calculation: 459 R Axis:   -14 Text Interpretation: Normal sinus rhythm When compared with ECG of 07-Oct-2008 16:55, Confirmed by 09-Oct-2008 ([id number]) on 02/13/2022 9:40:33 PM  Radiology DG Chest Portable 1 View  Result Date: 02/13/2022 CLINICAL DATA:  Shortness of breath EXAM: PORTABLE CHEST 1 VIEW COMPARISON:  11/27/2021  FINDINGS: Stable cardiomediastinal silhouette. Sternotomy and CABG. Elevated left hemidiaphragm. Left basilar atelectasis. No pleural effusion or pneumothorax. No displaced rib fractures. IMPRESSION: No active disease. Electronically Signed   By: Minerva Fester M.D.   On: 02/13/2022 21:46    Procedures Procedures    Medications Ordered in ED Medications - No data to display  ED Course/ Medical Decision Making/ A&P                           Medical Decision Making Amount and/or Complexity of Data Reviewed Labs: ordered. Radiology: ordered.   Paul Hardy is here with shortness of breath.  History of COPD, CAD, CHF.  Unremarkable vitals.  No fever.  EKG shows sinus rhythm.  No ischemic changes.  Differential is viral process versus CHF versus ACS.  He is very well-appearing.  Not having chest pain.  Sometimes he states that anxiety makes him feel this way as well.  Could be mild COPD process.  EKG per my review interpretation is unremarkable.  Will get CBC, BMP, troponin, BNP, chest x-ray and reevaluate.  No concern for PE at this time.  He has no hypoxia or tachycardia, no recent surgery or travel.  No risk factors.  Per my review and interpretation of chest x-ray there is no pneumonia or pneumothorax.  COVID and flu test negative.  Troponin normal.  BNP unremarkable.  No significant anemia, electrolyte abnormality.  Creatinine at baseline.  Overall he is very well-appearing.  He is not having any chest  pain or active shortness of breath.  He had a heart cath couple months ago that was unremarkable.  I do not think that this is cardiac in nature.  Maybe there is some mild fluid retention we will have him take an extra 20 mg of Lasix the next 2 days and have him follow-up with his primary care team.  He would like a new cardiologist and will refer him to cardiology team.  Overall given reassurance and discharged in ED in good condition.  This chart was dictated using voice recognition software.  Despite best efforts to proofread,  errors can occur which can change the documentation meaning.         Final Clinical Impression(s) / ED Diagnoses Final diagnoses:  Shortness of breath  S/P CABG x 3    Rx / DC Orders ED Discharge Orders          Ordered    Ambulatory referral to Cardiology       Comments: If you have not heard from the Cardiology office within the next 72 hours please call [phone].   02/13/22 2251              Virgina Norfolk, DO 02/13/22 2253

## 2022-02-13 NOTE — Discharge Instructions (Signed)
Overall blood work today is reassuring.  I recommend that you take an additional 20 mg of Lasix in the evening the next 2 nights.  Follow-up with your primary care doctor and cardiology team.  I have also put in a referral for cardiology as you desire to switch cardiologist.

## 2022-02-16 ENCOUNTER — Telehealth: Payer: Self-pay | Admitting: General Practice

## 2022-02-16 NOTE — Telephone Encounter (Signed)
Transition Care Management Unsuccessful Follow-up Telephone Call  Date of discharge and from where:  02/13/22 from [facility]  Attempts:  1st Attempt  Reason for unsuccessful TCM follow-up call:  Left voice message

## 2022-02-17 NOTE — Telephone Encounter (Signed)
Transition Care Management Unsuccessful Follow-up Telephone Call  Date of discharge and from where:  02/13/22 from [facility]  Attempts:  2nd Attempt  Reason for unsuccessful TCM follow-up call:  Left voice message

## 2022-02-22 NOTE — Telephone Encounter (Signed)
Transition Care Management Unsuccessful Follow-up Telephone Call  Date of discharge and from where:  02/13/22 from [facility]  Attempts:  3rd Attempt  Reason for unsuccessful TCM follow-up call:  Left voice message

## 2022-02-23 ENCOUNTER — Encounter: Payer: Self-pay | Admitting: Primary Care

## 2022-02-23 ENCOUNTER — Ambulatory Visit: Payer: No Typology Code available for payment source | Admitting: Primary Care

## 2022-02-23 VITALS — BP 118/76 | HR 58 | Temp 98.1°F | Ht 73.0 in | Wt 225.4 lb

## 2022-02-23 DIAGNOSIS — R0602 Shortness of breath: Secondary | ICD-10-CM | POA: Diagnosis not present

## 2022-02-23 DIAGNOSIS — G4733 Obstructive sleep apnea (adult) (pediatric): Secondary | ICD-10-CM

## 2022-02-23 DIAGNOSIS — K219 Gastro-esophageal reflux disease without esophagitis: Secondary | ICD-10-CM

## 2022-02-23 DIAGNOSIS — J4530 Mild persistent asthma, uncomplicated: Secondary | ICD-10-CM

## 2022-02-23 NOTE — Patient Instructions (Addendum)
Recommendations: Continue Symbicort two puffs twice daily  Use Albuterol 2 puffs every 4-6 hours as needed for breakthrough shortness of breath or wheezing Start famotine 20mg  OTC at bedtime for possible reflux contributing to nocturna wheezing  Orders: Home sleep test to re-assess sleep apnea  Methacholine challenge and MIP/MEP breathing test  (to assess for asthma and muscle weakness)  Follow-up: 3 months with Dr. Vaughan Browner (call after testing)  Asthma, Adult  Asthma is a condition that causes swelling and narrowing of the airways. These are the passages that lead from the nose and mouth down into the lungs. When asthma symptoms get worse it is called an asthma attack or flare. This can make it hard to breathe. Asthma flares can range from minor to life-threatening. There is no cure for asthma, but medicines and lifestyle changes can help to control it. What are the causes? It is not known exactly what causes asthma, but certain things can cause asthma symptoms to get worse (triggers). What can trigger an asthma attack? Cigarette smoke. Mold. Dust. Your pet's skin flakes (dander). Cockroaches. Pollen. Air pollution (like household cleaners, wood smoke, smog, or Advertising account planner). What are the signs or symptoms? Trouble breathing (shortness of breath). Coughing. Making high-pitched whistling sounds when you breathe, most often when you breathe out (wheezing). Chest tightness. Tiredness with little activity. Poor exercise tolerance. How is this treated? Controller medicines that help prevent asthma symptoms. Fast-acting reliever or rescue medicines. These give short-term relief of asthma symptoms. Allergy medicines if your attacks are brought on by allergens. Medicines to help control the body's defense (immune) system. Staying away from the things that cause asthma attacks. Follow these instructions at home: Avoiding triggers in your home Do not allow anyone to smoke in your  home. Limit use of fireplaces and wood stoves. Get rid of pests (such as roaches and mice) and their droppings. Keep your home clean. Clean your floors. Dust regularly. Use cleaning products that do not smell. Wash bed sheets and blankets every week in hot water. Dry them in a dryer. Have someone vacuum when you are not home. Change your heating and air conditioning filters often. Use blankets that are made of polyester or cotton. General instructions Take over-the-counter and prescription medicines only as told by your doctor. Do not smoke or use any products that contain nicotine or tobacco. If you need help quitting, ask your doctor. Stay away from secondhand smoke. Avoid doing things outdoors when allergen counts are high and when air quality is low. Warm up before you exercise. Take time to cool down after exercise. Use a peak flow meter as told by your doctor. A peak flow meter is a tool that measures how well your lungs are working. Keep track of the peak flow meter's readings. Write them down. Follow your asthma action plan. This is a written plan for taking care of your asthma and treating your attacks. Make sure you get all the shots (vaccines) that your doctor recommends. Ask your doctor about a flu shot and a pneumonia shot. Keep all follow-up visits. Contact a doctor if: You have wheezing, shortness of breath, or a cough even while taking medicine to prevent attacks. The mucus you cough up (sputum) is thicker than usual. The mucus you cough up changes from clear or white to yellow, green, gray, or is bloody. You have problems from the medicine you are taking, such as: A rash. Itching. Swelling. Trouble breathing. You need reliever medicines more than 2-3 times a week.  Your peak flow reading is still at 50-79% of your personal best after following the action plan for 1 hour. You have a fever. Get help right away if: You seem to be worse and are not responding to medicine  during an asthma attack. You are short of breath even at rest. You get short of breath when doing very little activity. You have trouble eating, drinking, or talking. You have chest pain or tightness. You have a fast heartbeat. Your lips or fingernails start to turn blue. You are light-headed or dizzy, or you faint. Your peak flow is less than 50% of your personal best. You feel too tired to breathe normally. These symptoms may be an emergency. Get help right away. Call 911. Do not wait to see if the symptoms will go away. Do not drive yourself to the hospital. Summary Asthma is a long-term (chronic) condition in which the airways get tight and narrow. An asthma attack can make it hard to breathe. Asthma cannot be cured, but medicines and lifestyle changes can help control it. Make sure you understand how to avoid triggers and how and when to use your medicines. Avoid things that can cause allergy symptoms (allergens). These include animal skin flakes (dander) and pollen from trees or grass. Avoid things that pollute the air. These may include household cleaners, wood smoke, smog, or chemical odors. This information is not intended to replace advice given to you by your health care provider. Make sure you discuss any questions you have with your health care provider. Document Revised: 11/16/2020 Document Reviewed: 11/16/2020 Elsevier Patient Education  [place] for Gastroesophageal Reflux Disease, Adult When you have gastroesophageal reflux disease (GERD), the foods you eat and your eating habits are very important. Choosing the right foods can help ease your discomfort. Think about working with a food expert (dietitian) to help you make good choices. What are tips for following this plan? Reading food labels Look for foods that are low in saturated fat. Foods that may help with your symptoms include: Foods that have less than 5% of daily value (DV) of fat. Foods  that have 0 grams of trans fat. Cooking Do not fry your food. Cook your food by baking, steaming, grilling, or broiling. These are all methods that do not need a lot of fat for cooking. To add flavor, try to use herbs that are low in spice and acidity. Meal planning  Choose healthy foods that are low in fat, such as: Fruits and vegetables. Whole grains. Low-fat dairy products. Lean meats, fish, and poultry. Eat small meals often instead of eating 3 large meals each day. Eat your meals slowly in a place where you are relaxed. Avoid bending over or lying down until 2-3 hours after eating. Limit high-fat foods such as fatty meats or fried foods. Limit your intake of fatty foods, such as oils, butter, and shortening. Avoid the following as told by your doctor: Foods that cause symptoms. These may be different for different people. Keep a food diary to keep track of foods that cause symptoms. Alcohol. Drinking a lot of liquid with meals. Eating meals during the 2-3 hours before bed. Lifestyle Stay at a healthy weight. Ask your doctor what weight is healthy for you. If you need to lose weight, work with your doctor to do so safely. Exercise for at least 30 minutes on 5 or more days each week, or as told by your doctor. Wear loose-fitting clothes. Do not smoke  or use any products that contain nicotine or tobacco. If you need help quitting, ask your doctor. Sleep with the head of your bed higher than your feet. Use a wedge under the mattress or blocks under the bed frame to raise the head of the bed. Chew sugar-free gum after meals. What foods should eat?  Eat a healthy, well-balanced diet of fruits, vegetables, whole grains, low-fat dairy products, lean meats, fish, and poultry. Each person is different. Foods that may cause symptoms in one person may not cause any symptoms in another person. Work with your doctor to find foods that are safe for you. The items listed above may not be a  complete list of what you can eat and drink. Contact a food expert for more options. What foods should I avoid? Limiting some of these foods may help in managing the symptoms of GERD. Everyone is different. Talk with a food expert or your doctor to help you find the exact foods to avoid, if any. Fruits Any fruits prepared with added fat. Any fruits that cause symptoms. For some people, this may include citrus fruits, such as oranges, grapefruit, pineapple, and lemons. Vegetables Deep-fried vegetables. [place] fries. Any vegetables prepared with added fat. Any vegetables that cause symptoms. For some people, this may include tomatoes and tomato products, chili peppers, onions and garlic, and horseradish. Grains Pastries or quick breads with added fat. Meats and other proteins High-fat meats, such as fatty beef or pork, hot dogs, ribs, ham, sausage, salami, and bacon. Fried meat or protein, including fried fish and fried chicken. Nuts and nut butters, in large amounts. Dairy Whole milk and chocolate milk. Sour cream. Cream. Ice cream. Cream cheese. Milkshakes. Fats and oils Butter. Margarine. Shortening. Ghee. Beverages Coffee and tea, with or without caffeine. Carbonated beverages. Sodas. Energy drinks. Fruit juice made with acidic fruits, such as orange or grapefruit. Tomato juice. Alcoholic drinks. Sweets and desserts Chocolate and cocoa. Donuts. Seasonings and condiments Pepper. Peppermint and spearmint. Added salt. Any condiments, herbs, or seasonings that cause symptoms. For some people, this may include curry, hot sauce, or vinegar-based salad dressings. The items listed above may not be a complete list of what you should not eat and drink. Contact a food expert for more options. Questions to ask your doctor Diet and lifestyle changes are often the first steps that are taken to manage symptoms of GERD. If diet and lifestyle changes do not help, talk with your doctor about taking  medicines. Where to find more information International Foundation for Gastrointestinal Disorders: aboutgerd.org Summary When you have GERD, food and lifestyle choices are very important in easing your symptoms. Eat small meals often instead of 3 large meals a day. Eat your meals slowly and in a place where you are relaxed. Avoid bending over or lying down until 2-3 hours after eating. Limit high-fat foods such as fatty meats or fried foods. This information is not intended to replace advice given to you by your health care provider. Make sure you discuss any questions you have with your health care provider. Document Revised: 08/19/2019 Document Reviewed: 08/19/2019 Elsevier Patient Education  2023 [employer].

## 2022-02-23 NOTE — Progress Notes (Signed)
$'@Patient'i$  ID: Paul Hardy, male    DOB: Feb 01, 1961, 62 y.o.   MRN: [id number]  Chief Complaint  Patient presents with   Follow-up    ED 02/13/2022 Pulm hypertension  SOB,DOE,wheezing ,Dry cough     Referring provider: Luetta Nutting, DO  HPI: 62 year old ex-smoker with coronary artery disease Suffered cardiac arrest in October 2022 requiring triple bypass surgery.  He has had intermittent shortness of breath since then and was told that this is secondary to Brilinta.  He has had a pulmonary evaluation including PFTs which show some restriction and diffusion impairment.  Chest x-ray with basilar atelectasis with a VQ scan at [facility] ED with low probability of pulmonary embolism.  Continues follow-up with cardiology at [place] for ischemic cardiomyopathy with EF 40-45%.    Pets: Has dogs, cats Occupation: Used to work as a Airline pilot, Audiological scientist.  Currently a truck driver Exposures: No mold, hot tub, Jacuzzi.  No feather pillows or comforter Smoking history: 60-pack-year smoker.  Quit in October 2022 Travel history: No significant travel history Relevant family history: No family history of lung disease  Interim history: Started on Symbicort at last visit.  He states that he notices some mild improvement but continues to have dyspnea Has been ordered CPAP AutoSet with nasal pillow but is finding it difficult to use as he is a mouth breather.  He needs facemask ordered through [facility] as [facility] does not have the supplies  12/27/2021 Patient presents today to discuss Inspire device. Patient had a polysomnogram on 03/11/2018, had difficulty initiating and maintaining sleep.  No significant limb movements.  Mild snoring. Overall AHI 18.3/hr. Cardiologist had some concerns that sleep studies were completed to soon after having coronary artery bypass graft.  He is intolerant to CPAP.  He is interested in inspire device. Overall sleeping ok.    02/23/2022 Patient presents today for ED  follow-up/ concern for pulmonary HTN.  He is doing well today without acute complaints. He has hx moderate OSA, intolerant to CPAP possibly interested in inspire  He is sleeping well at night  Dyspnea symptoms depend on what he is doing and weather.  He has a dry cough and experiences wheezing at night.  CT chest without interstitial abnormalities  PFTs showed restriction and diffusion impairment felt to be due to heart failure and obesity  Maintained on symbicort d/t wheezing symptoms   No evidence of pulmonary HTN on echocardiogram in June 2023 Referred to [facility] cardiologist    Allergies  Allergen Reactions   Bee Venom Anaphylaxis   Iodinated Contrast Media Anaphylaxis    Rash and Shortness of Breath   Hydrochlorothiazide Other (See Comments)    Dehydration and muscle spasms. Lasix does the same   Lisinopril Cough    Losartan is OK    Immunization History  Administered Date(s) Administered   Influenza Inj Mdck Quad Pf 12/02/2016   Influenza, Quadrivalent, Recombinant, Inj, Pf 11/24/2018   Influenza, Seasonal, Injecte, Preservative Fre 11/24/2014, 12/02/2016   Influenza,inj,Quad PF,6+ Mos 12/04/2020   Influenza,inj,quad, With Preservative 12/02/2016   Influenza-Unspecified 11/22/2021   Moderna Sars-Covid-2 Vaccination 04/01/2019, 04/29/2019, 02/18/2020, 08/01/2020   Tdap 09/07/2013, 06/08/2019   Unspecified SARS-COV-2 Vaccination 11/22/2021   Zoster Recombinat (Shingrix) 09/19/2016, 01/21/2017   Zoster, Live 09/19/2016    Past Medical History:  Diagnosis Date   CHF (congestive heart failure) (HCC)     Tobacco History: Social History   Tobacco Use  Smoking Status Former   Packs/day: 1.00   Years: 20.00  Total pack years: 20.00   Types: Cigarettes   Start date: 02/21/1998   Quit date: 11/24/2020   Years since quitting: 1.4   Passive exposure: Never  Smokeless Tobacco Never   Counseling given: Not Answered   Outpatient Medications Prior to Visit   Medication Sig Dispense Refill   AMBULATORY NON FORMULARY MEDICATION Please provide incentive spirometer.  Use every 2 hours.  Dx: Post-operative state/CABG Z95.1 (Patient not taking: Reported on 04/28/2022) 1 Device 0   AMBULATORY NON FORMULARY MEDICATION Please provide rollator walker.  Diagnosis: Gait instability, CABG R26.81, Z95.1 (Patient not taking: Reported on 04/28/2022) 1 Device 0   atorvastatin (LIPITOR) 80 MG tablet Take 80 mg by mouth at bedtime.     clopidogrel (PLAVIX) 75 MG tablet Take 75 mg by mouth daily.     DTx App - Sleep (SLEEPIO/DAYLIGHT APP BUNDLE) MISC 1 each by Other route daily. (Patient not taking: Reported on 04/28/2022)     DULoxetine (CYMBALTA) 30 MG capsule TAKE 1 CAPSULE BY MOUTH EVERY DAY (Patient taking differently: Take 30 mg by mouth daily.) 90 capsule 2   EPINEPHrine 0.3 mg/0.3 mL IJ SOAJ injection Inject 0.3 mg into the muscle as needed for anaphylaxis.     fluticasone-salmeterol (WIXELA INHUB) 250-50 MCG/ACT AEPB Inhale 1 puff into the lungs in the morning and at bedtime. (Replaced Symbicort) (Patient not taking: Reported on 04/28/2022) 1 each 3   furosemide (LASIX) 40 MG tablet Take 80 mg by mouth daily. 40 mg in the morning and '40mg'$  night     KLOR-CON M20 20 MEQ tablet Take 20 mEq by mouth daily. As needed     metoprolol succinate (TOPROL-XL) 50 MG 24 hr tablet TAKE 1 TABLET BY MOUTH EVERY DAY 90 tablet 1   Multiple Vitamin (MULTIVITAMIN) tablet Take 1 tablet by mouth daily.     nitroGLYCERIN (NITROSTAT) 0.4 MG SL tablet Place 0.4 mg under the tongue every 5 (five) minutes as needed for chest pain.     omeprazole (PRILOSEC) 40 MG capsule Take 40 mg by mouth daily.     ondansetron (ZOFRAN ODT) 4 MG disintegrating tablet Take 1 tablet (4 mg total) by mouth every 8 (eight) hours as needed for nausea or vomiting. 20 tablet 0   ranolazine (RANEXA) 500 MG 12 hr tablet Take 500 mg by mouth 2 (two) times daily.     sildenafil (REVATIO) 20 MG tablet Take as few as 2 and as  many as 5 by mouth at least one hour prior to sex (Patient taking differently: Take 20 mg by mouth See admin instructions. Take as few as 2 and as many as 5 by mouth at least one hour prior to sex) 30 tablet 5   tamsulosin (FLOMAX) 0.4 MG CAPS capsule Take by mouth.     terbinafine (LAMISIL) 250 MG tablet Take 1 tablet (250 mg total) by mouth daily. After completion of first month of medication, repeat blood work. 30 tablet 2   SYMBICORT 160-4.5 MCG/ACT inhaler SMARTSIG:2 Puff(s) Via Inhaler Morning-Night     doxycycline (VIBRAMYCIN) 100 MG capsule Take one cap PO Q12hr with food. (Patient not taking: Reported on 04/28/2022) 14 capsule 0   albuterol (VENTOLIN HFA) 108 (90 Base) MCG/ACT inhaler Inhale into the lungs.     predniSONE (DELTASONE) 20 MG tablet Take one tab by mouth twice daily for 4 days, then one daily for 3 days. Take with food. 11 tablet 0   No facility-administered medications prior to visit.   Review of Systems  Review of Systems  Constitutional: Negative.   HENT: Negative.    Respiratory:  Positive for shortness of breath.        Nocturnal wheezing/ Dry cough  Cardiovascular: Negative.   Psychiatric/Behavioral:  Negative for sleep disturbance.     Physical Exam  BP 118/76 (BP Location: Left Arm, Patient Position: Sitting, Cuff Size: Large)   Pulse (!) 58   Temp 98.1 F (36.7 C) (Oral)   Ht '6\' 1"'$  (1.854 m)   Wt 225 lb 6.4 oz (102.2 kg)   SpO2 97%   BMI 29.74 kg/m  Physical Exam Constitutional:      Appearance: Normal appearance.  HENT:     Head: Normocephalic and atraumatic.     Mouth/Throat:     Mouth: Mucous membranes are moist.     Pharynx: Oropharynx is clear.  Cardiovascular:     Rate and Rhythm: Normal rate and regular rhythm.  Pulmonary:     Effort: Pulmonary effort is normal.     Breath sounds: Normal breath sounds. No wheezing, rhonchi or rales.  Musculoskeletal:        General: Normal range of motion.  Skin:    General: Skin is warm and dry.   Neurological:     General: No focal deficit present.     Mental Status: He is alert and oriented to person, place, and time. Mental status is at baseline.  Psychiatric:        Mood and Affect: Mood normal.        Behavior: Behavior normal.        Thought Content: Thought content normal.        Judgment: Judgment normal.      Lab Results:  CBC    Component Value Date/Time   WBC 11.2 (H) 02/13/2022 2150   RBC 5.35 02/13/2022 2150   HGB 15.9 02/13/2022 2150   HCT 48.9 02/13/2022 2150   PLT 263 02/13/2022 2150   MCV 91.4 02/13/2022 2150   MCH 29.7 02/13/2022 2150   MCHC 32.5 02/13/2022 2150   RDW 13.2 02/13/2022 2150   LYMPHSABS 3.6 02/13/2022 2150   MONOABS 1.0 02/13/2022 2150   EOSABS 0.1 02/13/2022 2150   BASOSABS 0.1 02/13/2022 2150    BMET    Component Value Date/Time   NA 142 03/29/2022 0913   K 3.7 03/29/2022 0913   CL 103 03/29/2022 0913   CO2 23 03/29/2022 0913   GLUCOSE 110 (H) 03/29/2022 0913   GLUCOSE 125 (H) 02/13/2022 2150   BUN 11 03/29/2022 0913   CREATININE 1.62 (H) 03/29/2022 0913   CREATININE 1.39 (H) 04/23/2021 0000   CALCIUM 9.2 03/29/2022 0913   GFRNONAA 44 (L) 02/13/2022 2150    BNP    Component Value Date/Time   BNP 25.5 02/13/2022 2150   BNP 24 04/23/2021 0000    ProBNP No results found for: "PROBNP"  Imaging: MR Abdomen W Wo Contrast  Result Date: 04/11/2022 CLINICAL DATA:  Renal mass, indeterminate lesion discovered on prior imaging. EXAM: MRI ABDOMEN WITHOUT AND WITH CONTRAST TECHNIQUE: Multiplanar multisequence MR imaging of the abdomen was performed both before and after the administration of intravenous contrast. CONTRAST:  26m GADAVIST GADOBUTROL 1 MMOL/ML IV SOLN COMPARISON:  Upper abdominal imaging is not available. High-resolution chest CTs are present. Cystic renal lesion described on previous CT in the RIGHT kidney as a Bosniak category II F lesion. FINDINGS: Lower chest: Incidental imaging of the lung bases is  unremarkable to the extent evaluated on this abdominal MRI.  Hepatobiliary: Smooth hepatic contours. Mild hepatic steatosis. No focal, suspicious hepatic lesion. Post cholecystectomy. No biliary duct dilation. Pancreas:  Normal, without mass, inflammation or ductal dilatation. Spleen:  Normal. Adrenals/Urinary Tract:  Adrenal glands are normal. Symmetric renal enhancement without hydronephrosis, perinephric stranding or suspicious renal lesion. RIGHT kidney with small Bosniak category I and II cysts. Largest is a posterior interpolar-lower pole lesion measuring 19 x 19 mm showing faint intrinsic dependent increased T1 signal. Another mildly hemorrhagic or proteinaceous cyst in the lower pole measuring 9 mm without signs of enhancement on subtraction imaging. Additional RIGHT upper pole lesion measuring 18 mm also without enhancement. Small bowel is neck category II lesions on the LEFT also display no signs of enhancement. Stomach/Bowel: No acute gastrointestinal findings to the extent evaluated on this abdominal MRI. Vascular/Lymphatic: No pathologically enlarged lymph nodes identified. No abdominal aortic aneurysm demonstrated. Other:  No ascites. Musculoskeletal: No suspicious bone lesions identified. IMPRESSION: 1. 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 No suspicious renal lesion. No additional dedicated imaging follow-up is recommended for these findings in the absence of localizing symptoms. 2. Mild hepatic steatosis. 3. Post cholecystectomy. Electronically Signed   By: Zetta Bills M.D.   On: 04/11/2022 09:51   CT CHEST LUNG CA SCREEN LOW DOSE W/O CM  Result Date: 03/30/2022 CLINICAL DATA:  Current smoker with 20 pack-year history EXAM: CT CHEST WITHOUT CONTRAST LOW-DOSE FOR LUNG CANCER SCREENING TECHNIQUE: Multidetector CT imaging of the chest was performed following the standard protocol without IV contrast. RADIATION DOSE REDUCTION: This exam was performed according to the departmental  dose-optimization program which includes automated exposure control, adjustment of the mA and/or kV according to patient size and/or use of iterative reconstruction technique. COMPARISON:  Chest CT dated February 04, 2021 FINDINGS: Cardiovascular: Normal heart size. No pericardial effusion. Normal caliber thoracic aorta with mild calcified plaque. Severe coronary artery calcifications status post CABG. Mediastinum/Nodes: Esophagus and thyroid are unremarkable. No pathologically enlarged lymph nodes seen in the chest. Lungs/Pleura: Central airways are patent. Mild centrilobular emphysema. No consolidation, pleural effusion or pneumothorax. Linear opacity of the lingula, likely due to scarring or atelectasis. Stable small solid pulmonary nodules small juxta fissural solid nodule of the right middle lobe measuring 4.3 mm on image 169, unchanged when compared with the prior exam. Upper Abdomen: Cholecystectomy clips.  No acute abnormality. Musculoskeletal: Prior median sternotomy with intact sternal wires. No acute osseous abnormality. IMPRESSION: 1. Lung-RADS 2, benign appearance or behavior. Continue annual screening with low-dose chest CT without contrast in 12 months. 2. Aortic Atherosclerosis (ICD10-I70.0) and Emphysema (ICD10-J43.9). Electronically Signed   By: Yetta Glassman M.D.   On: 03/30/2022 14:50     Assessment & Plan:   Reactive airway disease Patient has symptoms of dyspnea with exertion, dry cough and nocturnal wheeze Continue Symbicort 19mg two puffs twice daily  Use Albuterol 2 puffs every 4-6 hours as needed for breakthrough shortness of breath or wheezing Checking methacholine challenge and MIP/MEP breathing test to assess for asthma and muscle weakness  Gastroesophageal reflux disease without esophagitis Start famotine '20mg'$  OTC at bedtime for possible reflux contributing to nocturnal wheezing  OSA on CPAP Moderate OSA, intolerant to CPAP [place]  repeat sleep testing to re-assess degree of sleep apnea   EMartyn Ehrich NP 04/28/2022

## 2022-02-26 ENCOUNTER — Other Ambulatory Visit: Payer: Self-pay | Admitting: Podiatrist

## 2022-03-07 ENCOUNTER — Other Ambulatory Visit: Payer: Self-pay | Admitting: Family Medicine

## 2022-03-07 DIAGNOSIS — N529 Male erectile dysfunction, unspecified: Secondary | ICD-10-CM

## 2022-03-07 NOTE — Telephone Encounter (Signed)
Last refill was 10/13/20 - is refill appropriate for rx?

## 2022-03-14 ENCOUNTER — Ambulatory Visit (INDEPENDENT_AMBULATORY_CARE_PROVIDER_SITE_OTHER): Payer: No Typology Code available for payment source | Admitting: Family Medicine

## 2022-03-14 ENCOUNTER — Encounter: Payer: Self-pay | Admitting: Family Medicine

## 2022-03-14 VITALS — BP 138/85 | HR 81 | Ht 73.0 in | Wt 222.0 lb

## 2022-03-14 DIAGNOSIS — I10 Essential (primary) hypertension: Secondary | ICD-10-CM

## 2022-03-14 DIAGNOSIS — I255 Ischemic cardiomyopathy: Secondary | ICD-10-CM | POA: Diagnosis not present

## 2022-03-14 DIAGNOSIS — Z87891 Personal history of nicotine dependence: Secondary | ICD-10-CM

## 2022-03-14 DIAGNOSIS — N2889 Other specified disorders of kidney and ureter: Secondary | ICD-10-CM | POA: Diagnosis not present

## 2022-03-14 DIAGNOSIS — F4322 Adjustment disorder with anxiety: Secondary | ICD-10-CM | POA: Diagnosis not present

## 2022-03-14 NOTE — Assessment & Plan Note (Signed)
BP remains well controlled.  Continue current medications.  

## 2022-03-14 NOTE — Assessment & Plan Note (Signed)
His dyspnea has improved. He will continue to see cardiology regularly.  Recommend continuation of current medications.

## 2022-03-14 NOTE — Assessment & Plan Note (Signed)
Needs updated urology referral.

## 2022-03-14 NOTE — Progress Notes (Signed)
Paul Hardy - 62 y.o. male MRN [id number]  Date of birth: [birth year]  Subjective Chief Complaint  Patient presents with   Hypertension   Mood    HPI Paul Hardy is a 62 y.o. male here today for follow up visit.   He reports that he is doing pretty well.  He continues to see cardiology. His dyspnea has improved.   He denies new or worsening chest pain.  He is planning on returning back to work.  He is compliant with medications.    He reports improvement in mood with cymbalta.  He does feel like this causes some fatigue.  He is only going to be working 3 days per week and his plan is to not take the medication on the days he is working.  He has not noted any other significant side effects.   ROS:  A comprehensive ROS was completed and negative except as noted per HPI   Allergies  Allergen Reactions   Bee Venom Anaphylaxis   Iodinated Contrast Media Anaphylaxis    Rash and Shortness of Breath   Hydrochlorothiazide Other (See Comments)    Dehydration and muscle spasms. Lasix does the same   Lisinopril Cough    Losartan is OK    Past Medical History:  Diagnosis Date   CHF (congestive heart failure) (HCC)     Past Surgical History:  Procedure Laterality Date   CARDIAC SURGERY     CHOLECYSTECTOMY, LAPAROSCOPIC     VASECTOMY      Social History   Socioeconomic History   Marital status: Married    Spouse name: Not on file   Number of children: Not on file   Years of education: Not on file   Highest education level: Not on file  Occupational History   Not on file  Tobacco Use   Smoking status: Former    Packs/day: 1.00    Years: 20.00    Total pack years: 20.00    Types: Cigarettes    Start date: 02/21/1998    Quit date: 11/24/2020    Years since quitting: 1.3    Passive exposure: Never   Smokeless tobacco: Never  Vaping Use   Vaping Use: Never used  Substance and Sexual Activity   Alcohol use: Not Currently   Drug use: Never   Sexual activity: Not on file   Other Topics Concern   Not on file  Social History Narrative   Not on file   Social Determinants of Health   Financial Resource Strain: Not on file  Food Insecurity: Not on file  Transportation Needs: Not on file  Physical Activity: Not on file  Stress: Not on file  Social Connections: Not on file    Family History  Problem Relation Age of Onset   Cancer Mother    Diabetes Father    Heart Problems Father     Health Maintenance  Topic Date Due   Lung Cancer Screening  02/04/2022   Hepatitis C Screening  06/29/2022 (Originally [birth year]20/1978)   HIV Screening  06/29/2022 (Originally 08/20/1975)   COVID-19 Vaccine (6 - 2023-24 season) 03/31/2023 (Originally 01/17/2022)   Fecal DNA (Cologuard)  11/04/2023   DTaP/Tdap/Td (3 - Td or Tdap) 06/07/2029   INFLUENZA VACCINE  Completed   Zoster Vaccines- Shingrix  Completed   HPV VACCINES  Aged Out     ----------------------------------------------------------------------------------------------------------------------------------------------------------------------------------------------------------------- Physical Exam BP 138/85 (BP Location: Left Arm, Patient Position: Sitting, Cuff Size: Large)   Pulse 81   Ht 6\' 1"  (1.854  m)   Wt 222 lb (100.7 kg)   SpO2 100%   BMI 29.29 kg/m   Physical Exam Constitutional:      Appearance: Normal appearance.  HENT:     Head: Normocephalic and atraumatic.  Eyes:     General: No scleral icterus. Cardiovascular:     Rate and Rhythm: Normal rate and regular rhythm.  Pulmonary:     Effort: Pulmonary effort is normal.     Breath sounds: Normal breath sounds.  Musculoskeletal:     Cervical back: Neck supple.  Neurological:     Mental Status: He is alert.  Psychiatric:        Mood and Affect: Mood normal.        Behavior: Behavior normal.      ------------------------------------------------------------------------------------------------------------------------------------------------------------------------------------------------------------------- Assessment and Plan  Ischemic cardiomyopathy His dyspnea has improved. He will continue to see cardiology regularly.  Recommend continuation of current medications.   Essential hypertension BP remains well controlled.  Continue current medications.   Renal mass Needs updated urology referral.   Adjustment disorder We discussed not skipping cymbalta due to potential for withdrawal symptoms.  He will continue this and try taking prior to bedtime.    No orders of the defined types were placed in this encounter.   No follow-ups on file.    This visit occurred during the SARS-CoV-2 public health emergency.  Safety protocols were in place, including screening questions prior to the visit, additional usage of staff PPE, and extensive cleaning of exam room while observing appropriate contact time as indicated for disinfecting solutions.

## 2022-03-14 NOTE — Assessment & Plan Note (Signed)
We discussed not skipping cymbalta due to potential for withdrawal symptoms.  He will continue this and try taking prior to bedtime.

## 2022-03-15 ENCOUNTER — Other Ambulatory Visit: Payer: Self-pay | Admitting: Family Medicine

## 2022-03-16 ENCOUNTER — Other Ambulatory Visit: Payer: Self-pay | Admitting: Primary Care

## 2022-03-29 ENCOUNTER — Ambulatory Visit: Payer: No Typology Code available for payment source | Attending: Cardiology | Admitting: Cardiology

## 2022-03-29 ENCOUNTER — Encounter: Payer: Self-pay | Admitting: Cardiology

## 2022-03-29 VITALS — BP 110/80 | HR 57 | Ht 73.0 in | Wt 224.0 lb

## 2022-03-29 DIAGNOSIS — E785 Hyperlipidemia, unspecified: Secondary | ICD-10-CM

## 2022-03-29 DIAGNOSIS — I213 ST elevation (STEMI) myocardial infarction of unspecified site: Secondary | ICD-10-CM

## 2022-03-29 DIAGNOSIS — I10 Essential (primary) hypertension: Secondary | ICD-10-CM | POA: Diagnosis not present

## 2022-03-29 DIAGNOSIS — Z951 Presence of aortocoronary bypass graft: Secondary | ICD-10-CM

## 2022-03-29 DIAGNOSIS — N1831 Chronic kidney disease, stage 3a: Secondary | ICD-10-CM

## 2022-03-29 DIAGNOSIS — G4733 Obstructive sleep apnea (adult) (pediatric): Secondary | ICD-10-CM

## 2022-03-29 DIAGNOSIS — R0602 Shortness of breath: Secondary | ICD-10-CM

## 2022-03-29 NOTE — Patient Instructions (Signed)
Medication Instructions:  Your physician recommends that you continue on your current medications as directed. Please refer to the Current Medication list given to you today.  *If you need a refill on your cardiac medications before your next appointment, please call your pharmacy*   Lab Work:  Rose today  If you have labs (blood work) drawn today and your tests are completely normal, you will receive your results only by: MyChart Message (if you have MyChart) OR A paper copy in the mail If you have any lab test that is abnormal or we need to change your treatment, we will call you to review the results.   Testing/Procedures: None Ordered   Follow-Up: At [facility], you and your health needs are our priority.  As part of our continuing mission to provide you with exceptional heart care, we have created designated Provider Care Teams.  These Care Teams include your primary Cardiologist (physician) and Advanced Practice Providers (APPs -  Physician Assistants and Nurse Practitioners) who all work together to provide you with the care you need, when you need it.  We recommend signing up for the patient portal called "MyChart".  Sign up information is provided on this After Visit Summary.  MyChart is used to connect with patients for Virtual Visits (Telemedicine).  Patients are able to view lab/test results, encounter notes, upcoming appointments, etc.  Non-urgent messages can be sent to your provider as well.   To learn more about what you can do with MyChart, go to [email or web address].    Your next appointment:   5 month(s)  The format for your next appointment:   In Person  Provider:   Jenne Campus, MD    Other Instructions NA

## 2022-03-29 NOTE — Progress Notes (Signed)
Cardiology Consultation:    Date:  03/29/2022   ID:  Paul Hardy, DOB 1961/01/24, MRN [id number]  PCP:  Luetta Nutting, DO  Cardiologist:  Jenne Campus, MD   Referring MD: Lennice Sites, DO   Chief Complaint  Patient presents with   Establish Care    History of Present Illness:    Paul Hardy is a 62 y.o. male who is being seen today for the evaluation of to be established as a patient at the request of Lennice Sites, DO.  Past medical history significant for coronary artery disease.  In 2022 he required coronary bypass graft.  Month later he started having chest pain, cardiac catheterization has been performed he was find to have 1 graft completely occluded, and noted cardiac catheterization was done in December 2023 at that time there were no no new target for intervention.  Additional problem include essential hypertension, dyslipidemia, obstructive sleep apnea, cannot tolerate CPAP mask.  He been struggling with shortness of breath and for a while nobody could figure out what was the problem he end up seeing pulmonologist.  Eventually surprising he would find out that Imdur was giving him problems, that medication has been discontinued and since that time he is doing well.  He goes to gym on the regular basis he exercise he is back at work and very happy.  Additional problem include depression but now with medication and the fact that he is back at work he is doing quite well.  Denies have any chest pain tightness squeezing pressure burning chest, he does have minimal swelling of right lower extremity for which he takes Lasix on as-needed basis.  He quit smoking day before his coronary artery bypass graft.  Is been smoking for many years.  Apparently was told that he does not have COPD  Past Medical History:  Diagnosis Date   CHF (congestive heart failure) (HCC)     Past Surgical History:  Procedure Laterality Date   CARDIAC SURGERY     CHOLECYSTECTOMY, LAPAROSCOPIC      VASECTOMY      Current Medications: Current Meds  Medication Sig   albuterol (VENTOLIN HFA) 108 (90 Base) MCG/ACT inhaler INHALE 2 PUFFS INTO THE LUNGS EVERY 6 HOURS AS NEEDED FOR WHEEZING (Patient taking differently: Inhale 2 puffs into the lungs every 6 (six) hours as needed for wheezing or shortness of breath.)   AMBULATORY NON FORMULARY MEDICATION Please provide incentive spirometer.  Use every 2 hours.  Dx: Post-operative state/CABG Z95.1 (Patient taking differently: 1 each by Other route See admin instructions. Please provide incentive spirometer.  Use every 2 hours.  Dx: Post-operative state/CABG Z95.1)   AMBULATORY NON FORMULARY MEDICATION Please provide rollator walker.  Diagnosis: Gait instability, CABG R26.81, Z95.1 (Patient taking differently: 1 each by Other route See admin instructions. Please provide rollator walker.  Diagnosis: Gait instability, CABG R26.81, Z95.1)   aspirin 81 MG chewable tablet Chew 81 mg by mouth daily.   atorvastatin (LIPITOR) 80 MG tablet Take 80 mg by mouth at bedtime.   clopidogrel (PLAVIX) 75 MG tablet Take 75 mg by mouth daily.   doxycycline (VIBRAMYCIN) 100 MG capsule Take one cap PO Q12hr with food. (Patient taking differently: Take 100 mg by mouth 2 (two) times daily. Take one cap PO Q12hr with food.)   DTx App - Sleep (SLEEPIO/DAYLIGHT APP BUNDLE) MISC 1 each by Other route daily.   DULoxetine (CYMBALTA) 30 MG capsule TAKE 1 CAPSULE BY MOUTH EVERY DAY (Patient taking differently: Take 30  mg by mouth daily.)   EPINEPHrine 0.3 mg/0.3 mL IJ SOAJ injection Inject 0.3 mg into the muscle as needed for anaphylaxis.   fluticasone-salmeterol (WIXELA INHUB) 250-50 MCG/ACT AEPB Inhale 1 puff into the lungs in the morning and at bedtime. (Replaced Symbicort)   furosemide (LASIX) 40 MG tablet Take 80 mg by mouth daily. 40 mg in the morning and 38m night   isosorbide mononitrate (IMDUR) 30 MG 24 hr tablet Take 30 mg by mouth daily.   KLOR-CON M20 20 MEQ tablet Take  20 mEq by mouth daily. As needed   metoprolol succinate (TOPROL-XL) 50 MG 24 hr tablet TAKE 1 TABLET BY MOUTH EVERY DAY   Multiple Vitamin (MULTIVITAMIN) tablet Take 1 tablet by mouth daily.   nitroGLYCERIN (NITROSTAT) 0.4 MG SL tablet Place 0.4 mg under the tongue every 5 (five) minutes as needed for chest pain.   omeprazole (PRILOSEC) 40 MG capsule Take 40 mg by mouth daily.   ondansetron (ZOFRAN ODT) 4 MG disintegrating tablet Take 1 tablet (4 mg total) by mouth every 8 (eight) hours as needed for nausea or vomiting.   ranolazine (RANEXA) 500 MG 12 hr tablet Take 500 mg by mouth 2 (two) times daily.   sildenafil (REVATIO) 20 MG tablet Take as few as 2 and as many as 5 by mouth at least one hour prior to sex (Patient taking differently: Take 20 mg by mouth See admin instructions. Take as few as 2 and as many as 5 by mouth at least one hour prior to sex)   SYMBICORT 160-4.5 MCG/ACT inhaler INHALE 2 PUFFS INTO THE LUNGS IN THE MORNING AND AT BEDTIME   tadalafil (CIALIS) 20 MG tablet Take 20 mg by mouth as needed for erectile dysfunction.   terbinafine (LAMISIL) 250 MG tablet Take 1 tablet (250 mg total) by mouth daily. After completion of first month of medication, repeat blood work.     Allergies:   Bee venom, Iodinated contrast media, Hydrochlorothiazide, and Lisinopril   Social History   Socioeconomic History   Marital status: Married    Spouse name: Not on file   Number of children: Not on file   Years of education: Not on file   Highest education level: Not on file  Occupational History   Not on file  Tobacco Use   Smoking status: Former    Packs/day: 1.00    Years: 20.00    Total pack years: 20.00    Types: Cigarettes    Start date: 02/21/1998    Quit date: 11/24/2020    Years since quitting: 1.3    Passive exposure: Never   Smokeless tobacco: Never  Vaping Use   Vaping Use: Never used  Substance and Sexual Activity   Alcohol use: Not Currently   Drug use: Never   Sexual  activity: Not on file  Other Topics Concern   Not on file  Social History Narrative   Not on file   Social Determinants of Health   Financial Resource Strain: Not on file  Food Insecurity: Not on file  Transportation Needs: Not on file  Physical Activity: Not on file  Stress: Not on file  Social Connections: Not on file     Family History: The patient's family history includes Cancer in his mother; Diabetes in his father; Heart Problems in his father. ROS:   Please see the history of present illness.    All 14 point review of systems negative except as described per history of present illness.  EKGs/Labs/Other Studies Reviewed:    The following studies were reviewed today:   EKG:  EKG is  ordered today.  The ekg ordered today demonstrates normal sinus rhythm normal P interval normal QS complex duration fulgent no ST segment changes  Recent Labs: 12/27/2021: ALT 23 02/13/2022: B Natriuretic Peptide 25.5; BUN 22; Creatinine, Ser 1.75; Hemoglobin 15.9; Platelets 263; Potassium 4.1; Sodium 137  Recent Lipid Panel    Component Value Date/Time   CHOL 126 04/23/2021 0000   TRIG 241 (H) 04/23/2021 0000   HDL 32 (L) 04/23/2021 0000   CHOLHDL 3.9 04/23/2021 0000   LDLCALC 64 04/23/2021 0000    Physical Exam:    VS:  BP 110/80 (BP Location: Left Arm)   Pulse (!) 57   Ht 6' 1"$  (1.854 m)   Wt 224 lb (101.6 kg)   SpO2 98%   BMI 29.55 kg/m     Wt Readings from Last 3 Encounters:  03/29/22 224 lb (101.6 kg)  03/14/22 222 lb (100.7 kg)  02/23/22 225 lb 6.4 oz (102.2 kg)     GEN:  Well nourished, well developed in no acute distress HEENT: Normal NECK: No JVD; No carotid bruits LYMPHATICS: No lymphadenopathy CARDIAC: RRR, no murmurs, no rubs, no gallops RESPIRATORY:  Clear to auscultation without rales, wheezing or rhonchi  ABDOMEN: Soft, non-tender, non-distended MUSCULOSKELETAL:  No edema; No deformity  SKIN: Warm and dry NEUROLOGIC:  Alert and oriented x  3 PSYCHIATRIC:  Normal affect   ASSESSMENT:    1. Dyslipidemia   2. Essential hypertension   3. ST elevation myocardial infarction (STEMI), unspecified artery ([place])   4. S/P CABG x 3   5. SOB (shortness of breath)   6. Stage 3a chronic kidney disease ([place])   7. OSA on CPAP    PLAN:    In order of problems listed above:  Coronary artery disease status post coronary bypass graft.  Doing well from that point review.  Continue with dual antiplatelets therapy and the reason for that I suspect is the fact that he occluded graft shortly after bypass surgery.  He has not difficulty tolerating dual antiplatelets therapy.  Denies having any typical angina pectoris. Shortness of breath much improved right now surprising it was caused by Imdur.  Now medication is being taken away and he is doing well. Kidney dysfunction.  I will check his Chem-7. Dyslipidemia he is taking high intense statin which I will continue his LDL 61 HDL 32.  He does have a probably triglycerides.  I think this dose will be better after he started doing exercise which is strongly encouraged to continue. Obstructive sleep apnea.  He does have another sleep study scheduled.  However he tells me already that he cannot tolerate CPAP because of very high pressure being applied.  That may need to be modified but first will be to recheck and see how that his sleep apnea since his sleep study being done very quickly after open heart surgery when he was still taking a lot of narcotics. Overall he is doing quite well.  He is a smoker but quit smoking the day before surgery he said he is to have no intention to come back to it.  He actually tells me that he is getting nauseated when he smells of cigarettes.  I encouraged him to stay away from it.  He exercised on the regular basis we briefly discussed Mediterranean diet.  I being chaperone during entire visit with my nurse Francia Greaves  Medication Adjustments/Labs and Tests  Ordered: Current medicines are reviewed at length with the patient today.  Concerns regarding medicines are outlined above.  Orders Placed This Encounter  Procedures   Lipid panel   Basic metabolic panel   EKG [id number]   No orders of the defined types were placed in this encounter.   Signed, Park Liter, MD, [facility]. 03/29/2022 9:05 AM    [place] physician the entire appt time- Suann Larry. Kenton Kingfisher, RN

## 2022-03-30 ENCOUNTER — Ambulatory Visit (INDEPENDENT_AMBULATORY_CARE_PROVIDER_SITE_OTHER): Payer: 59

## 2022-03-30 DIAGNOSIS — F17210 Nicotine dependence, cigarettes, uncomplicated: Secondary | ICD-10-CM | POA: Diagnosis not present

## 2022-03-30 DIAGNOSIS — F17200 Nicotine dependence, unspecified, uncomplicated: Secondary | ICD-10-CM | POA: Diagnosis not present

## 2022-03-30 DIAGNOSIS — Z87891 Personal history of nicotine dependence: Secondary | ICD-10-CM

## 2022-03-30 LAB — BASIC METABOLIC PANEL
BUN/Creatinine Ratio: 7 — ABNORMAL LOW (ref 10–24)
BUN: 11 mg/dL (ref 8–27)
CO2: 23 mmol/L (ref 20–29)
Calcium: 9.2 mg/dL (ref 8.6–10.2)
Chloride: 103 mmol/L (ref 96–106)
Creatinine, Ser: 1.62 mg/dL — ABNORMAL HIGH (ref 0.76–1.27)
Glucose: 110 mg/dL — ABNORMAL HIGH (ref 70–99)
Potassium: 3.7 mmol/L (ref 3.5–5.2)
Sodium: 142 mmol/L (ref 134–144)
eGFR: 48 mL/min/{1.73_m2} — ABNORMAL LOW (ref >59)

## 2022-03-30 LAB — LIPID PANEL
Chol/HDL Ratio: 3.5 ratio (ref 0.0–5.0)
Cholesterol, Total: 114 mg/dL (ref 100–199)
HDL: 33 mg/dL — ABNORMAL LOW (ref >39)
LDL Chol Calc (NIH): 46 mg/dL (ref 0–99)
Triglycerides: 215 mg/dL — ABNORMAL HIGH (ref 0–149)
VLDL Cholesterol Cal: 35 mg/dL (ref 5–40)

## 2022-03-31 ENCOUNTER — Ambulatory Visit: Payer: 59 | Admitting: Primary Care

## 2022-03-31 DIAGNOSIS — G4733 Obstructive sleep apnea (adult) (pediatric): Secondary | ICD-10-CM

## 2022-04-05 ENCOUNTER — Ambulatory Visit (INDEPENDENT_AMBULATORY_CARE_PROVIDER_SITE_OTHER): Payer: 59 | Admitting: Urology

## 2022-04-05 ENCOUNTER — Encounter: Payer: Self-pay | Admitting: Urology

## 2022-04-05 VITALS — BP 132/90 | HR 53 | Ht 73.0 in | Wt 224.0 lb

## 2022-04-05 DIAGNOSIS — N2889 Other specified disorders of kidney and ureter: Secondary | ICD-10-CM

## 2022-04-05 LAB — URINALYSIS
Bilirubin, UA: NEGATIVE
Blood, UA: NEGATIVE
Glucose, UA: NEGATIVE mg/dL
Ketones, UA: NEGATIVE
Leukocytes, UA: NEGATIVE
Nitrite, UA: NEGATIVE
Protein, UA: NEGATIVE
Spec Grav, UA: 1.025 (ref 1.010–1.025)
Urobilinogen, UA: 1 E.U./dL
pH, UA: 6 (ref 5.0–8.0)

## 2022-04-05 NOTE — Progress Notes (Addendum)
Assessment: 1. Renal mass      Plan: Today I had a long discussion with the patient regarding cystic renal masses including a detailed description of the Bosniak classification.  His lesion as noted is statistically overwhelmingly benign but should undergo serial follow-up.  Given his CKD I have recommended a renal protocol MRI.  Patient will return in 2 months with BMP and renal protocol MRI prior to visit.   ADDENDUM: MRI reviewed and discussed with patient on phone.  No worrisome lesions seen.  No need for ongoing serial FU.  Chief Complaint:  Chief Complaint  Patient presents with   Renal Mass    History of Present Illness:  Paul Hardy is a 62 y.o. male who is seen in consultation from Luetta Nutting, DO for evaluation of a small minimally complex right renal cyst. 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 Patient has pmhx of CAD s/p CABG, hypertension, dyslipidemia, obstructive sleep apnea, cannot tolerate CPAP mask.  Patient has recently been evaluated for increased shortness of breath and was found to do much better after discontinuing Imdur.  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  Also noted to have a 25m left nonobstructing renal calculus.  [facility]   Patient denies current GU complaints except mild-mod LUTS (ipss= 11)   Past Medical History:  Past Medical History:  Diagnosis Date   CHF (congestive heart failure) ([place]     Past Surgical History:  Past Surgical History:  Procedure Laterality Date   CARDIAC SURGERY     CHOLECYSTECTOMY, LAPAROSCOPIC     VASECTOMY      Allergies:  Allergies  Allergen Reactions   Bee Venom Anaphylaxis   Iodinated Contrast Media Anaphylaxis    Rash and Shortness of Breath   Hydrochlorothiazide  Other (See Comments)    Dehydration and muscle spasms. Lasix does the same   Lisinopril Cough    Losartan is OK    Family History:  Family History  Problem Relation Age of Onset   Cancer Mother    Diabetes Father    Heart Problems Father     Social History:  Social History   Tobacco Use   Smoking status: Former    Packs/day: 1.00    Years: 20.00    Total pack years: 20.00    Types: Cigarettes    Start date: 02/21/1998    Quit date: 11/24/2020    Years since quitting: 1.3    Passive exposure: Never   Smokeless tobacco: Never  Vaping Use   Vaping Use: Never used  Substance Use Topics   Alcohol use: Not Currently   Drug use: Never    Review of symptoms:  Constitutional:  Negative for unexplained weight loss, night sweats, fever, chills ENT:  Negative for nose bleeds, sinus pain, painful swallowing CV:  Negative for chest pain, shortness of breath, exercise intolerance, palpitations, loss of consciousness Resp:  Negative for cough, wheezing.  Recent shortness of breath much improved with d/c Imdur. GI:  Negative for nausea, vomiting, diarrhea, bloody stools.  Positive for heart burn. GU:  Positives noted in HPI; otherwise negative for gross hematuria, dysuria, urinary incontinence Neuro:  Negative for seizures, poor balance, limb weakness, slurred speech Psych:  Negative for lack of energy, depression.  Positive  for anxiety Endocrine:  Negative for polydipsia, polyuria, symptoms of hypoglycemia (dizziness, hunger, sweating) Hematologic:  Negative for anemia, purpura, petechia, prolonged or excessive bleeding, use of anticoagulants  Allergic:  Negative for difficulty breathing or choking as a result of exposure to anything; no shellfish allergy; no allergic response (rash/itch) to materials, foods  Physical exam: BP (!) 132/90   Pulse (!) 53   Ht 6' 1"$  (1.854 m)   Wt 224 lb (101.6 kg)   BMI 29.55 kg/m  GENERAL APPEARANCE:  Well appearing, well developed, well nourished,  NAD

## 2022-04-09 ENCOUNTER — Ambulatory Visit (HOSPITAL_BASED_OUTPATIENT_CLINIC_OR_DEPARTMENT_OTHER)
Admission: RE | Admit: 2022-04-09 | Discharge: 2022-04-09 | Disposition: A | Discharge status: Home / Self Care | Payer: 59 | Source: Ambulatory Visit | Attending: Urology | Admitting: Urology

## 2022-04-09 DIAGNOSIS — N281 Cyst of kidney, acquired: Secondary | ICD-10-CM | POA: Diagnosis not present

## 2022-04-09 DIAGNOSIS — K760 Fatty (change of) liver, not elsewhere classified: Secondary | ICD-10-CM | POA: Diagnosis not present

## 2022-04-09 DIAGNOSIS — Z9049 Acquired absence of other specified parts of digestive tract: Secondary | ICD-10-CM | POA: Diagnosis not present

## 2022-04-09 DIAGNOSIS — N2889 Other specified disorders of kidney and ureter: Secondary | ICD-10-CM | POA: Diagnosis not present

## 2022-04-09 MED ORDER — GADOBUTROL 1 MMOL/ML IV SOLN
10.0000 mL | Freq: Once | INTRAVENOUS | Status: AC | PRN
  Administered 2022-04-09: 10 mL via INTRAVENOUS

## 2022-04-12 ENCOUNTER — Telehealth: Payer: Self-pay

## 2022-04-12 NOTE — Telephone Encounter (Signed)
Dr. Nevada Crane spoke with patient regarding imaging results. Per provider, patient does not need a follow up appt. Future appt cancelled.

## 2022-04-12 NOTE — Telephone Encounter (Signed)
-----   Message from Pamala Hurry, MD sent at 04/12/2022 12:12 PM EST ----- Regarding: FU I called him with his MRI results.  No worrisome lesions. He does not need a FU appt-  please cancel Than ks,

## 2022-04-18 ENCOUNTER — Telehealth: Payer: Self-pay | Admitting: Pulmonary Disease

## 2022-04-18 DIAGNOSIS — G4733 Obstructive sleep apnea (adult) (pediatric): Secondary | ICD-10-CM

## 2022-04-18 NOTE — Telephone Encounter (Signed)
Thanks Dr. Leilani Hardy please let patient know Home sleep testing showed moderate obstructive sleep apnea. Ccan we get patient scheduled for virtual visit to review result and treatment options.

## 2022-04-18 NOTE — Telephone Encounter (Signed)
Call patient  Sleep study result  Date of study: 03/31/2022  Impression: Moderate obstructive sleep apnea Mild oxygen desaturations  Recommendation:  Recommend CPAP therapy for moderate obstructive sleep apnea  Auto titrating CPAP with pressure settings of 5-15 will be appropriate  Other options of treatment for sleep disordered breathing should be considered with the patient's history of intolerance to CPAP  Encourage weight loss measures  Follow-up in the office 4 to 6 weeks following initiation of treatment

## 2022-04-18 NOTE — Progress Notes (Signed)
Paul Hardy please let patient know HST on 03/31/22 showed mild-moderate sleep apnea, average 15 events an hours. Needs virtual visit to review results and treatment options

## 2022-04-19 NOTE — Telephone Encounter (Signed)
Attempted to call pt but unable to reach. Left message to return call.  

## 2022-04-20 NOTE — Telephone Encounter (Signed)
Patient is returning phone call. Patient phone number is [phone].

## 2022-04-21 NOTE — Telephone Encounter (Signed)
Called and spoke with pt letting him know the results of HST and that BW wanted him to be scheduled for a virtual visit. Pt said he would have to call us back to schedule the virtual visit once he knew his work schedule due to him working for [employer] doing 12 hour shifts.

## 2022-04-22 NOTE — Telephone Encounter (Signed)
Pt has scheduled a f/u with BW. Closing encounter.

## 2022-04-28 ENCOUNTER — Telehealth (INDEPENDENT_AMBULATORY_CARE_PROVIDER_SITE_OTHER): Payer: 59 | Admitting: Primary Care

## 2022-04-28 DIAGNOSIS — G4733 Obstructive sleep apnea (adult) (pediatric): Secondary | ICD-10-CM

## 2022-04-28 DIAGNOSIS — J45909 Unspecified asthma, uncomplicated: Secondary | ICD-10-CM | POA: Insufficient documentation

## 2022-04-28 DIAGNOSIS — J432 Centrilobular emphysema: Secondary | ICD-10-CM | POA: Diagnosis not present

## 2022-04-28 DIAGNOSIS — J4530 Mild persistent asthma, uncomplicated: Secondary | ICD-10-CM

## 2022-04-28 NOTE — Progress Notes (Signed)
Virtual Visit via Video Note  I connected with Paul Hardy on 04/28/22 at 11:30 AM EST by a video enabled telemedicine application and verified that I am speaking with the correct person using two identifiers.  Location: Patient: Home Provider: Office    I discussed the limitations of evaluation and management by telemedicine and the availability of in person appointments. The patient expressed understanding and agreed to proceed.  History of Present Illness:  62 year old ex-smoker with coronary artery disease Suffered cardiac arrest in October 2022 requiring triple bypass surgery.  He has had intermittent shortness of breath since then and was told that this is secondary to Brilinta.  He has had a pulmonary evaluation including PFTs which show some restriction and diffusion impairment.  Chest x-ray with basilar atelectasis with a VQ scan at [facility] ED with low probability of pulmonary embolism.  Continues follow-up with cardiology at [place] for ischemic cardiomyopathy with EF 40-45%.    Pets: Has dogs, cats Occupation: Used to work as a Airline pilot, Audiological scientist.  Currently a truck driver Exposures: No mold, hot tub, Jacuzzi.  No feather pillows or comforter Smoking history: 60-pack-year smoker.  Quit in October 2022 Travel history: No significant travel history Relevant family history: No family history of lung disease  Interim history: Started on Symbicort at last visit.  He states that he notices some mild improvement but continues to have dyspnea Has been ordered CPAP AutoSet with nasal pillow but is finding it difficult to use as he is a mouth breather.  He needs facemask ordered through [facility] as [facility] does not have the supplies  12/27/2021 Patient presents today to discuss Inspire device. Patient had a polysomnogram on 03/11/2018, had difficulty initiating and maintaining sleep.  No significant limb movements.  Mild snoring. Overall AHI 18.3/hr. Cardiologist had some  concerns that sleep studies were completed to soon after having coronary artery bypass graft.  He is intolerant to CPAP.  He is interested in inspire device. Overall sleeping ok.   02/23/2022 Patient presents today for ED follow-up/ concern for pulmonary HTN.  He is doing well today without acute complaints. He has hx moderate OSA, intolerant to CPAP possibly interested in inspire  He is sleeping well at night  Dyspnea symptoms depend on what he is doing and weather.  He has a dry cough and experiences wheezing at night.  CT chest without interstitial abnormalities  PFTs showed restriction and diffusion impairment felt to be due to heart failure and obesity  Maintained on symbicort d/t wheezing symptoms   No evidence of pulmonary HTN on echocardiogram in June 2023 Referred to [facility] cardiologist    04/28/2022- Interim hx  Patient contacted today to review sleep study results.  Patient was seen for follow-up in January due to concerns for pulmonary hypertension.  During that visit he was doing well without acute complaints.  No evidence of pulmonary hypertension on echocardiogram in June 2023.  He has a history of moderate obstructive sleep apnea intolerant to CPAP.  He reports sleeping well at night.  We repeated sleep testing to reassess degree of his sleep apnea as he was possibly interested in inspire device.  Home sleep study on 03/31/21 showed moderate obstructive sleep apnea, average AHI 15.1 an hour with SpO2 low 82% (average 92%). He is minimally symptomatic. He feels he sleeps well at night. He is not a candidate for oral appliance d.t dentures. Sleep apnea not severe enough for inspire device. He would like to conservatively manage this. Encourage weight  loss efforts and optimizing side sleep position.    Observations/Objective:  - Unable to access video portion of virtual visit  Assessment and Plan:  Moderate sleep apnea: - Home sleep study on 03/31/21 showed moderate obstructive  sleep apnea, average AHI 15.1 an hour with SpO2 low 82% (average 92%). He is not a candidate for oral appliance d.t dentures. I would say that his sleep apnea is not severe enough for inspire device. Encourage 20-30lbs weight loss and optimizing side sleep position.   Reactive airway disease: - Continue Symbicort 155mg two puffs twice daily and prn Albuterol hfa  - PFTs showed restrictive and diffusion impairment  - Mild emphysema on CT imaging. No interstitial lung disease   Former smoker: - Continue lung cancer screening - LDCT 03/30/22>> lung RADS 2, benig appearance or behavior of lung nodules   Follow Up Instructions:   6 months follow-up   I discussed the assessment and treatment plan with the patient. The patient was provided an opportunity to ask questions and all were answered. The patient agreed with the plan and demonstrated an understanding of the instructions.   The patient was advised to call back or seek an in-person evaluation if the symptoms worsen or if the condition fails to improve as anticipated.  I provided 22 minutes of non-face-to-face time during this encounter.   EMartyn Ehrich NP

## 2022-04-28 NOTE — Assessment & Plan Note (Signed)
Moderate OSA, intolerant to CPAP Possibly interested in [place] Needs repeat sleep testing to re-assess degree of sleep apnea

## 2022-04-28 NOTE — Assessment & Plan Note (Addendum)
Patient has symptoms of dyspnea with exertion, dry cough and nocturnal wheeze Continue Symbicort 167mg two puffs twice daily  Use Albuterol 2 puffs every 4-6 hours as needed for breakthrough shortness of breath or wheezing Checking methacholine challenge and MIP/MEP breathing test to assess for asthma and muscle weakness

## 2022-04-28 NOTE — Assessment & Plan Note (Signed)
Start famotine '20mg'$  OTC at bedtime for possible reflux contributing to nocturnal wheezing

## 2022-05-17 DIAGNOSIS — R110 Nausea: Secondary | ICD-10-CM | POA: Diagnosis not present

## 2022-05-17 DIAGNOSIS — R1111 Vomiting without nausea: Secondary | ICD-10-CM | POA: Diagnosis not present

## 2022-05-17 DIAGNOSIS — R112 Nausea with vomiting, unspecified: Secondary | ICD-10-CM | POA: Diagnosis not present

## 2022-05-17 DIAGNOSIS — R069 Unspecified abnormalities of breathing: Secondary | ICD-10-CM | POA: Diagnosis not present

## 2022-05-17 DIAGNOSIS — I10 Essential (primary) hypertension: Secondary | ICD-10-CM | POA: Diagnosis not present

## 2022-05-17 DIAGNOSIS — R1032 Left lower quadrant pain: Secondary | ICD-10-CM | POA: Diagnosis not present

## 2022-05-17 DIAGNOSIS — R197 Diarrhea, unspecified: Secondary | ICD-10-CM | POA: Diagnosis not present

## 2022-05-18 ENCOUNTER — Ambulatory Visit (INDEPENDENT_AMBULATORY_CARE_PROVIDER_SITE_OTHER): Payer: 59 | Admitting: Physician Assistant

## 2022-05-18 ENCOUNTER — Encounter: Payer: Self-pay | Admitting: Physician Assistant

## 2022-05-18 VITALS — BP 121/83 | HR 62 | Temp 97.9°F | Ht 73.0 in | Wt 215.0 lb

## 2022-05-18 DIAGNOSIS — Z8719 Personal history of other diseases of the digestive system: Secondary | ICD-10-CM | POA: Diagnosis not present

## 2022-05-18 DIAGNOSIS — R110 Nausea: Secondary | ICD-10-CM

## 2022-05-18 DIAGNOSIS — R197 Diarrhea, unspecified: Secondary | ICD-10-CM | POA: Diagnosis not present

## 2022-05-18 DIAGNOSIS — R1032 Left lower quadrant pain: Secondary | ICD-10-CM

## 2022-05-18 MED ORDER — CIPROFLOXACIN HCL 500 MG PO TABS: 500.0000 mg | ORAL_TABLET | Freq: Two times a day (BID) | ORAL | 0 refills | Status: DC

## 2022-05-18 MED ORDER — METRONIDAZOLE 500 MG PO TABS: 500.0000 mg | ORAL_TABLET | Freq: Three times a day (TID) | ORAL | 0 refills | Status: AC

## 2022-05-18 NOTE — Patient Instructions (Signed)
Diverticulitis  Diverticulitis is when small pouches in your colon get infected or swollen. This causes pain in your belly (abdomen) and watery poop (diarrhea). The small pouches are called diverticula. They may form if you have a condition called diverticulosis. What are the causes? You may get this condition if poop (stool) gets trapped in the pouches in your colon. The poop lets germs (bacteria) grow. This causes an infection. What increases the risk? You are more likely to get this condition if you have small pouches in your colon. You are also more likely to get it if: You are overweight or very overweight (obese). You do not exercise enough. You drink alcohol. You smoke. You eat a lot of red meat, like beef, pork, or lamb. You do not eat enough fiber. You are older than 62 years of age. What are the signs or symptoms? Pain in your belly. Pain is often on the left side, but it may be felt in other spots too. Fever and chills. Feeling like you may vomit. Vomiting. Having cramps. Feeling full. Changes in how often you poop. Blood in your poop. How is this treated? Most cases are treated at home. You may be told to: Take over-the-counter pain medicines. Only eat and drink clear liquids. Take antibiotics. Rest. Very bad cases may need to be treated at a hospital. Treatment may include: Not eating or drinking. Taking pain medicines. Getting antibiotics through an IV tube. Getting fluid and food through an IV tube. Having surgery. When you are feeling better, you may need to have a test to look at your colon (colonoscopy). Follow these instructions at home: Medicines Take over-the-counter and prescription medicines only as told by your doctor. These include: Fiber pills. Probiotics. Medicines to make your poop soft (stool softeners). If you were prescribed antibiotics, take them as told by your doctor. Do not stop taking them even if you start to feel better. Ask your  doctor if you should avoid driving or using machines while you are taking your medicine. Eating and drinking  Follow the diet told by your doctor. You may need to only eat and drink liquids. When you feel better, you may be able to eat more foods. You may also be told to eat a lot of fiber. Fiber helps you poop. Foods with fiber include berries, beans, lentils, and green vegetables. Try not to eat red meat. General instructions Do not smoke or use any products that contain nicotine or tobacco. If you need help quitting, ask your doctor. Exercise 3 or more times a week. Try to go for 30 minutes each time. Exercise enough to sweat and make your heart beat faster. Contact a doctor if: Your pain gets worse. You are not pooping like normal. Your symptoms do not get better. Your symptoms get worse very fast. You have a fever. You vomit more than one time. You have poop that is: Bloody. Black. Tarry. This information is not intended to replace advice given to you by your health care provider. Make sure you discuss any questions you have with your health care provider. Document Revised: 11/04/2021 Document Reviewed: 11/04/2021 Elsevier Patient Education  2023 Elsevier Inc.  

## 2022-05-18 NOTE — Progress Notes (Signed)
Acute Office Visit  Subjective:     Patient ID: Paul Hardy, male    DOB: 1960-03-01, 62 y.o.   MRN: [id number]  Chief Complaint  Patient presents with   Diarrhea    HPI Patient is in today for diarrhea, nausea, vomiting and abdominal pain that started Sunday night. Paul Hardy went to ED last night. WBC was 9.1. HgB was 15.9. C.diff and ova and parasites were negative. Paul Hardy was bentyl and zofran in office. Paul Hardy has had some improvement but already at 3 stools today. Denies any fever, chills, body aches. Denies any melena or hematochezia. Paul Hardy has had diverticulitis in the past and feels a lot like that.   .. 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   Alcohol use 11/26/2020   Adjustment disorder 12/06/2020   SOB (shortness of breath) 01/04/2021   Ischemic cardiomyopathy 01/18/2021   Dyslipidemia 02/03/2021   COPD exacerbation ([place]) 07/29/2021   Syncopal episodes 08/26/2021   Raynaud's disease without gangrene 09/28/2021   COVID 12/09/2021   Renal mass 12/09/2021   OSA on CPAP 12/07/2021   Reactive airway disease 04/28/2022   Resolved Ambulatory Problems    Diagnosis Date Noted   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  HENT:     Head: Normocephalic.  Cardiovascular:     Rate and Rhythm: Normal rate.  Pulmonary:     Effort: Pulmonary effort is normal.  Abdominal:     General: Bowel sounds are normal. There is distension.     Palpations: There is no mass.     Tenderness: There is abdominal tenderness. There is no right CVA tenderness, left CVA tenderness, guarding or rebound.     Hernia: No hernia is present.     Comments: Firm abdomen with left lower quadrant tenderness  Neurological:     Mental Status: Paul Hardy is alert.  Psychiatric:        Mood and Affect: Mood normal.          Assessment & Plan:  Marland KitchenMarland KitchenQuavon was seen today for diarrhea.  Diagnoses and all orders for this visit:  History of diverticulitis -     ciprofloxacin (CIPRO) 500 MG tablet; Take 1 tablet (500 mg total) by mouth 2 (two) times daily. -     metroNIDAZOLE (FLAGYL) 500 MG tablet; Take 1 tablet (500 mg total) by mouth 3 (three) times daily for 10 days.  Left lower quadrant pain -  ciprofloxacin (CIPRO) 500 MG tablet; Take 1 tablet (500 mg total) by mouth 2 (two) times daily. -     metroNIDAZOLE (FLAGYL) 500 MG tablet; Take 1 tablet (500 mg total) by mouth 3 (three) times daily for 10 days.  Nausea -     ciprofloxacin (CIPRO) 500 MG tablet; Take 1 tablet (500 mg total) by mouth 2 (two) times daily. -     metroNIDAZOLE (FLAGYL) 500 MG tablet; Take 1 tablet (500 mg total) by mouth 3 (three) times daily for 10 days.  Diarrhea, unspecified type -     ciprofloxacin (CIPRO) 500 MG tablet; Take 1 tablet (500 mg total) by mouth 2 (two) times daily. -     metroNIDAZOLE (FLAGYL) 500 MG tablet; Take 1 tablet (500 mg total) by mouth 3 (three) times daily for 10 days.   WBC upper limits of normal Hx of diverticulitis Left lower quadrant pain and tenderness with diarrhea Empirically treated with cipro  and flagyl for 10 days BRAT diet Written out of work for today and tomorrow If symptoms persist will get CT of abdomen Follow up as needed or if symptoms persist   Iran Planas, PA-C

## 2022-06-14 ENCOUNTER — Ambulatory Visit: Payer: No Typology Code available for payment source | Admitting: Urology

## 2022-06-16 ENCOUNTER — Other Ambulatory Visit (HOSPITAL_COMMUNITY): Payer: Self-pay

## 2022-06-16 ENCOUNTER — Other Ambulatory Visit: Payer: Self-pay | Admitting: Family Medicine

## 2022-06-16 MED ORDER — CLOPIDOGREL BISULFATE 75 MG PO TABS
75.0000 mg | ORAL_TABLET | Freq: Every day | ORAL | 0 refills | Status: DC
  Filled 2022-06-16 – 2022-06-20 (×3): qty 90, 90d supply, fill #0

## 2022-06-16 MED ORDER — ATORVASTATIN CALCIUM 80 MG PO TABS
80.0000 mg | ORAL_TABLET | Freq: Every day | ORAL | 0 refills | Status: DC
  Filled 2022-06-16 – 2022-06-20 (×3): qty 90, 90d supply, fill #0

## 2022-06-17 ENCOUNTER — Other Ambulatory Visit: Payer: Self-pay

## 2022-06-20 ENCOUNTER — Other Ambulatory Visit (HOSPITAL_COMMUNITY): Payer: Self-pay

## 2022-06-21 ENCOUNTER — Other Ambulatory Visit: Payer: Self-pay

## 2022-06-21 ENCOUNTER — Encounter: Payer: Self-pay | Admitting: Pharmacist

## 2022-08-12 ENCOUNTER — Ambulatory Visit (INDEPENDENT_AMBULATORY_CARE_PROVIDER_SITE_OTHER): Payer: 59 | Admitting: Physician Assistant

## 2022-08-12 ENCOUNTER — Encounter: Payer: Self-pay | Admitting: Physician Assistant

## 2022-08-12 VITALS — BP 138/99 | HR 79 | Ht 73.0 in | Wt 220.0 lb

## 2022-08-12 DIAGNOSIS — Z125 Encounter for screening for malignant neoplasm of prostate: Secondary | ICD-10-CM

## 2022-08-12 DIAGNOSIS — J411 Mucopurulent chronic bronchitis: Secondary | ICD-10-CM

## 2022-08-12 DIAGNOSIS — J441 Chronic obstructive pulmonary disease with (acute) exacerbation: Secondary | ICD-10-CM | POA: Diagnosis not present

## 2022-08-12 DIAGNOSIS — I10 Essential (primary) hypertension: Secondary | ICD-10-CM | POA: Diagnosis not present

## 2022-08-12 DIAGNOSIS — Z951 Presence of aortocoronary bypass graft: Secondary | ICD-10-CM | POA: Diagnosis not present

## 2022-08-12 DIAGNOSIS — Z1159 Encounter for screening for other viral diseases: Secondary | ICD-10-CM | POA: Diagnosis not present

## 2022-08-12 MED ORDER — BREZTRI AEROSPHERE 160-9-4.8 MCG/ACT IN AERO: 2.0000 | INHALATION_SPRAY | Freq: Two times a day (BID) | RESPIRATORY_TRACT | 0 refills | Status: DC

## 2022-08-12 MED ORDER — PREDNISONE 20 MG PO TABS: ORAL_TABLET | ORAL | 0 refills | Status: DC

## 2022-08-12 MED ORDER — DOXYCYCLINE HYCLATE 100 MG PO TABS: 100.0000 mg | ORAL_TABLET | Freq: Two times a day (BID) | ORAL | 0 refills | Status: DC

## 2022-08-12 NOTE — Patient Instructions (Signed)

## 2022-08-12 NOTE — Progress Notes (Signed)
Acute Office Visit  Subjective:     Patient ID: Paul Hardy, male    DOB: Nov 01, 1960, 62 y.o.   MRN: [id number]  Chief Complaint  Patient presents with   Cough    SOB, chest congestion    HPI Patient is in today for cough for 5 days that is dry. Pt has COPD, hx of STEMI, HTN, cardiomyopathy, OSA. He has been more SOB. No fever, chills, body aches, sinus pressure, ear pain or sore throat. He denies any lower extremity swelling. Taking coricidin and mucinex with little benefit.   .. 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      Objective:    BP (!) 138/99 (BP Location: Right Arm, Patient Position: Sitting, Cuff Size: Normal)   Pulse 79   Ht 6\' 1"  (1.854 m)   Wt 220 lb (99.8 kg)   SpO2 94%   BMI 29.03 kg/m  BP Readings from Last 3 Encounters:  08/12/22 (!) 138/99  05/18/22 121/83  04/05/22 (!) 132/90   Wt Readings from Last 3 Encounters:  08/12/22 220 lb (99.8 kg)  05/18/22 215 lb (97.5 kg)  04/05/22 224 lb (101.6 kg)       Physical Exam Constitutional:      Appearance: Normal appearance. He is obese.  HENT:     Head: Normocephalic.     Mouth/Throat:     Mouth: Mucous membranes are moist.     Pharynx: No oropharyngeal exudate or posterior oropharyngeal erythema.  Eyes:     Conjunctiva/sclera: Conjunctivae normal.  Neck:     Vascular: No carotid bruit.  Cardiovascular:     Rate and Rhythm: Normal rate and regular rhythm.     Pulses: Normal pulses.  Pulmonary:     Effort: Pulmonary effort is normal.     Breath sounds: Normal breath sounds. No wheezing or rhonchi.  Musculoskeletal:     Cervical back: Normal range of motion and neck supple. No rigidity or tenderness.     Right lower leg: No edema.     Left lower leg: No edema.  Lymphadenopathy:     Cervical: No cervical adenopathy.  Neurological:     General: No focal deficit present.     Mental Status: He is alert and oriented to person, place, and time.  Psychiatric:        Mood and Affect: Mood normal.          Assessment & Plan:  Marland KitchenMarland KitchenAurelio was seen today for cough.  Diagnoses and all orders for this visit:  COPD exacerbation (HCC) -  predniSONE (DELTASONE) 20 MG tablet; Take 3 tablets for 3 days, take 2 tablets for 3 days, take 1 tablet for 3 days, take 1/2 tablet for 4 days. -     Budeson-Glycopyrrol-Formoterol (BREZTRI AEROSPHERE) 160-9-4.8 MCG/ACT AERO; Inhale 2 puffs into the lungs 2 (two) times daily. -     doxycycline (VIBRA-TABS) 100 MG tablet; Take 1 tablet (100 mg total) by mouth 2 (two) times daily.  Essential hypertension -     COMPLETE METABOLIC PANEL WITH GFR -     CBC w/Diff/Platelet  S/P CABG x 3 -     Lipid Panel w/reflex Direct LDL -     CBC w/Diff/Platelet  Prostate cancer screening -     PSA  Mucopurulent chronic bronchitis (HCC) -     Budeson-Glycopyrrol-Formoterol (BREZTRI AEROSPHERE) 160-9-4.8 MCG/ACT AERO; Inhale 2 puffs into the lungs 2 (two) times daily.  Pulse ox 94 percent Lungs sound good COPD  exacerbation treated with doxycycline and prednisone burst May not need doxycycline. Use prednisone for 2 days and see if symptoms improve with just steriod Use albuterol as needed Stop symbicort and trial of Breztri Follow up as needed or if symptoms persist or worsen  Fasting labs ordered Follow up with PCP in 4 weeks on symbicort to [place] [facility]   Return in about 4 weeks (around 09/09/2022) for PCP.  Tandy Gaw, PA-C

## 2022-08-15 NOTE — Progress Notes (Signed)
To PCP

## 2022-08-16 NOTE — Progress Notes (Signed)
LM for pt to return my call. Roselyn Reef, CMA

## 2022-08-18 ENCOUNTER — Other Ambulatory Visit: Payer: Self-pay

## 2022-08-18 ENCOUNTER — Encounter: Payer: Self-pay | Admitting: Physician Assistant

## 2022-08-18 DIAGNOSIS — Z1159 Encounter for screening for other viral diseases: Secondary | ICD-10-CM

## 2022-08-18 LAB — CBC WITH DIFFERENTIAL/PLATELET
Absolute Monocytes: 855 cells/uL (ref 200–950)
Basophils Absolute: 38 cells/uL (ref 0–200)
Basophils Relative: 0.4 %
Eosinophils Absolute: 171 cells/uL (ref 15–500)
Eosinophils Relative: 1.8 %
HCT: 47.1 % (ref 38.5–50.0)
Hemoglobin: 15.5 g/dL (ref 13.2–17.1)
Lymphs Abs: 3002 cells/uL (ref 850–3900)
MCH: 29.8 pg (ref 27.0–33.0)
MCHC: 32.9 g/dL (ref 32.0–36.0)
MCV: 90.4 fL (ref 80.0–100.0)
MPV: 9.5 fL (ref 7.5–12.5)
Monocytes Relative: 9 %
Neutro Abs: 5434 cells/uL (ref 1500–7800)
Neutrophils Relative %: 57.2 %
Platelets: 214 10*3/uL (ref 140–400)
RBC: 5.21 10*6/uL (ref 4.20–5.80)
RDW: 12.2 % (ref 11.0–15.0)
Total Lymphocyte: 31.6 %
WBC: 9.5 10*3/uL (ref 3.8–10.8)

## 2022-08-18 LAB — COMPLETE METABOLIC PANEL WITH GFR
AG Ratio: 1.5 (calc) (ref 1.0–2.5)
ALT: 14 U/L (ref 9–46)
AST: 14 U/L (ref 10–35)
Albumin: 3.9 g/dL (ref 3.6–5.1)
Alkaline phosphatase (APISO): 109 U/L (ref 35–144)
BUN/Creatinine Ratio: 11 (calc) (ref 6–22)
BUN: 16 mg/dL (ref 7–25)
CO2: 26 mmol/L (ref 20–32)
Calcium: 9.4 mg/dL (ref 8.6–10.3)
Chloride: 102 mmol/L (ref 98–110)
Creat: 1.46 mg/dL — ABNORMAL HIGH (ref 0.70–1.35)
Globulin: 2.6 g/dL (calc) (ref 1.9–3.7)
Glucose, Bld: 121 mg/dL — ABNORMAL HIGH (ref 65–99)
Potassium: 4 mmol/L (ref 3.5–5.3)
Sodium: 139 mmol/L (ref 135–146)
Total Bilirubin: 0.8 mg/dL (ref 0.2–1.2)
Total Protein: 6.5 g/dL (ref 6.1–8.1)
eGFR: 54 mL/min/{1.73_m2} — ABNORMAL LOW (ref >=60)

## 2022-08-18 LAB — HEPATITIS C ANTIBODY

## 2022-08-18 LAB — LIPID PANEL W/REFLEX DIRECT LDL
Cholesterol: 116 mg/dL (ref <200)
HDL: 36 mg/dL — ABNORMAL LOW (ref >=40)
LDL Cholesterol (Calc): 58 mg/dL (calc)
Non-HDL Cholesterol (Calc): 80 mg/dL (calc) (ref <130)
Total CHOL/HDL Ratio: 3.2 (calc) (ref <5.0)
Triglycerides: 136 mg/dL (ref <150)

## 2022-08-18 LAB — PSA: PSA: 0.96 ng/mL (ref <=4.00)

## 2022-08-19 DIAGNOSIS — Z1159 Encounter for screening for other viral diseases: Secondary | ICD-10-CM | POA: Diagnosis not present

## 2022-08-20 LAB — HEPATITIS C ANTIBODY: Hepatitis C Ab: NONREACTIVE

## 2022-09-13 ENCOUNTER — Ambulatory Visit (INDEPENDENT_AMBULATORY_CARE_PROVIDER_SITE_OTHER): Payer: 59 | Admitting: Family Medicine

## 2022-09-13 ENCOUNTER — Encounter: Payer: Self-pay | Admitting: Family Medicine

## 2022-09-13 ENCOUNTER — Other Ambulatory Visit (HOSPITAL_COMMUNITY): Payer: Self-pay

## 2022-09-13 ENCOUNTER — Other Ambulatory Visit: Payer: Self-pay

## 2022-09-13 VITALS — BP 130/80 | HR 69 | Ht 73.0 in | Wt 220.0 lb

## 2022-09-13 DIAGNOSIS — F4322 Adjustment disorder with anxiety: Secondary | ICD-10-CM | POA: Diagnosis not present

## 2022-09-13 DIAGNOSIS — I255 Ischemic cardiomyopathy: Secondary | ICD-10-CM | POA: Diagnosis not present

## 2022-09-13 DIAGNOSIS — I10 Essential (primary) hypertension: Secondary | ICD-10-CM | POA: Diagnosis not present

## 2022-09-13 MED ORDER — ATORVASTATIN CALCIUM 80 MG PO TABS
80.0000 mg | ORAL_TABLET | Freq: Every day | ORAL | 3 refills | Status: DC
  Filled 2022-09-13 – 2022-12-07 (×2): qty 90, 90d supply, fill #0
  Filled 2023-03-02 – 2023-03-28 (×3): qty 90, 90d supply, fill #1
  Filled 2023-06-10: qty 90, 90d supply, fill #2
  Filled 2023-07-07 – 2023-08-23 (×3): qty 90, 90d supply, fill #3

## 2022-09-13 MED ORDER — METOPROLOL SUCCINATE ER 50 MG PO TB24
50.0000 mg | ORAL_TABLET | Freq: Every day | ORAL | 3 refills | Status: DC
  Filled 2022-09-13: qty 90, 90d supply, fill #0
  Filled 2022-12-07: qty 90, 90d supply, fill #1
  Filled 2023-03-02 – 2023-03-28 (×3): qty 90, 90d supply, fill #2
  Filled 2023-06-10: qty 90, 90d supply, fill #3

## 2022-09-13 MED ORDER — DULOXETINE HCL 30 MG PO CPEP
30.0000 mg | ORAL_CAPSULE | Freq: Every day | ORAL | 2 refills | Status: DC
  Filled 2022-09-13: qty 90, 90d supply, fill #0
  Filled 2022-12-07: qty 90, 90d supply, fill #1
  Filled 2023-03-02 – 2023-06-10 (×3): qty 90, 90d supply, fill #2

## 2022-09-13 MED ORDER — CLOPIDOGREL BISULFATE 75 MG PO TABS
75.0000 mg | ORAL_TABLET | Freq: Every day | ORAL | 0 refills | Status: DC
  Filled 2022-09-13 – 2022-12-07 (×2): qty 90, 90d supply, fill #0

## 2022-09-13 NOTE — Assessment & Plan Note (Signed)
Stable with duloxetine.  Better since return to work.  We may look at tapering at next visit.

## 2022-09-13 NOTE — Progress Notes (Signed)
Paul Hardy - 62 y.o. male MRN [id number]  Date of birth: 1960/06/02  Subjective Chief Complaint  Patient presents with   Hypertension    HPI Paul Hardy is a 62 y.o. male here today for follow up visit.   He reports he is doing pretty well.   History of STEMI and HTN.  Remains on Toprol XL 50mg  daily.  Has furosemide as needed as well.  Remains on Ranexa for chronic anginal symptoms.  He does have some intermittent episodes of dyspnea.  Denies new chest pain, palpitations, headache or vision changes.  He is back at work.    Mood remains well controlled with duloxetine.  Feels like it was definitely helpful for adjustment after his cardiac episodes.  He is back at work which he has found to be more beneficial than anything.    ROS:  A comprehensive ROS was completed and negative except as noted per HPI  Allergies  Allergen Reactions   Bee Venom Anaphylaxis   Iodinated Contrast Media Anaphylaxis    Rash and Shortness of Breath   Hydrochlorothiazide Other (See Comments)    Dehydration and muscle spasms. Lasix does the same   Lisinopril Cough    Losartan is OK    Past Medical History:  Diagnosis Date   CHF (congestive heart failure) (HCC)     Past Surgical History:  Procedure Laterality Date   CARDIAC SURGERY     CHOLECYSTECTOMY, LAPAROSCOPIC     VASECTOMY      Social History   Socioeconomic History   Marital status: Married    Spouse name: Not on file   Number of children: Not on file   Years of education: Not on file   Highest education level: Not on file  Occupational History   Not on file  Tobacco Use   Smoking status: Former    Current packs/day: 0.00    Average packs/day: 1 pack/day for 22.8 years (22.8 ttl pk-yrs)    Types: Cigarettes    Start date: 02/21/1998    Quit date: 11/24/2020    Years since quitting: 1.8    Passive exposure: Never   Smokeless tobacco: Never  Vaping Use   Vaping status: Never Used  Substance and Sexual Activity   Alcohol  use: Not Currently   Drug use: Never   Sexual activity: Not on file  Other Topics Concern   Not on file  Social History Narrative   Not on file   Social Determinants of Health   Financial Resource Strain: Patient Declined (05/06/2022)   Received from [facility], [facility]   Overall Financial Resource Strain (CARDIA)    Difficulty of Paying Living Expenses: Patient declined  Food Insecurity: Patient Declined (05/06/2022)   Received from [facility], [facility]   Hunger Vital Sign    Worried About Running Out of Food in the Last Year: Patient declined    Ran Out of Food in the Last Year: Patient declined  Transportation Needs: Patient Declined (05/06/2022)   Received from [facility], [facility]   PRAPARE - Transportation    Lack of Transportation (Medical): Patient declined    Lack of Transportation (Non-Medical): Patient declined  Physical Activity: Sufficiently Active (03/15/2022)   Received from [facility], [facility]   Exercise Vital Sign    Days of Exercise per Week: 7 days    Minutes of Exercise per Session: 60 min  Stress: No Stress Concern Present (03/15/2022)   Received from [facility], [facility]  [employer] of Occupational Health - Occupational Stress Questionnaire    Feeling of Stress : Only a little  Recent Concern: Stress - Stress Concern Present (01/24/2022)   Received from [facility] of Occupational Health - Occupational Stress Questionnaire    Feeling of Stress : To some extent  Social Connections: Moderately Integrated (03/15/2022)   Received from [facility], [facility]   Social Network    How would you rate your social network (family, work, friends)?: Adequate participation with social networks    Family History  Problem Relation Age of Onset   Cancer Mother    Diabetes Father    Heart Problems Father     Health Maintenance  Topic Date Due   COVID-19 Vaccine (6 - 2023-24 season)  03/31/2023 (Originally 01/17/2022)   HIV Screening  09/13/2023 (Originally 08/20/1975)   INFLUENZA VACCINE  09/22/2022   Lung Cancer Screening  03/31/2023   Fecal DNA (Cologuard)  11/04/2023   DTaP/Tdap/Td (3 - Td or Tdap) 06/07/2029   Hepatitis C Screening  Completed   Zoster Vaccines- Shingrix  Completed   HPV VACCINES  Aged Out     ----------------------------------------------------------------------------------------------------------------------------------------------------------------------------------------------------------------- Physical Exam BP 130/80 (BP Location: Left Arm, Patient Position: Sitting, Cuff Size: Normal)   Pulse 69   Ht 6\' 1"  (1.854 m)   Wt 220 lb (99.8 kg)   SpO2 96%   BMI 29.03 kg/m   Physical Exam Constitutional:      Appearance: Normal appearance.  HENT:     Head: Normocephalic and atraumatic.  Neurological:     Mental Status: He is alert.  Psychiatric:        Mood and Affect: Mood normal.        Behavior: Behavior normal.     ------------------------------------------------------------------------------------------------------------------------------------------------------------------------------------------------------------------- Assessment and Plan  Essential hypertension BP remains well controlled.  Continue metoprolol at current strength.    Ischemic cardiomyopathy Some intermittent dyspnea.  On Ranexa at this time. He will keep appt with cardiology next month.   Adjustment disorder Stable with duloxetine.  Better since return to work.  We may look at tapering at next visit.    Meds ordered this encounter  Medications   atorvastatin (LIPITOR) 80 MG tablet    Sig: Take 1 tablet (80 mg total) by mouth at bedtime.    Dispense:  90 tablet    Refill:  3    Please mail. Thanks!   clopidogrel (PLAVIX) 75 MG tablet    Sig: Take 1 tablet (75 mg total) by mouth daily.    Dispense:  90 tablet    Refill:  0    Please mail. Thanks!    DULoxetine (CYMBALTA) 30 MG capsule    Sig: Take 1 capsule (30 mg total) by mouth daily.    Dispense:  90 capsule    Refill:  2   metoprolol succinate (TOPROL-XL) 50 MG 24 hr tablet    Sig: Take 1 tablet (50 mg total) by mouth daily.    Dispense:  90 tablet    Refill:  3    Return in about 6 months (around 03/16/2023) for HTN/Mood.    This visit occurred during the SARS-CoV-2 public health emergency.  Safety protocols were in place, including screening questions prior to the visit, additional usage of staff PPE, and extensive cleaning of exam room while observing appropriate contact time as indicated for disinfecting solutions.

## 2022-09-13 NOTE — Assessment & Plan Note (Signed)
BP remains well controlled.  Continue metoprolol at current strength.

## 2022-09-13 NOTE — Assessment & Plan Note (Signed)
Some intermittent dyspnea.  On Ranexa at this time. He will keep appt with cardiology next month.

## 2022-10-04 ENCOUNTER — Ambulatory Visit: Payer: 59 | Attending: Cardiology | Admitting: Cardiology

## 2022-10-04 ENCOUNTER — Encounter: Payer: Self-pay | Admitting: Cardiology

## 2022-10-04 VITALS — BP 148/92 | HR 84 | Ht 73.0 in | Wt 226.0 lb

## 2022-10-04 DIAGNOSIS — I255 Ischemic cardiomyopathy: Secondary | ICD-10-CM

## 2022-10-04 DIAGNOSIS — Z951 Presence of aortocoronary bypass graft: Secondary | ICD-10-CM | POA: Diagnosis not present

## 2022-10-04 DIAGNOSIS — I10 Essential (primary) hypertension: Secondary | ICD-10-CM | POA: Diagnosis not present

## 2022-10-04 DIAGNOSIS — R0609 Other forms of dyspnea: Secondary | ICD-10-CM

## 2022-10-04 DIAGNOSIS — E785 Hyperlipidemia, unspecified: Secondary | ICD-10-CM | POA: Diagnosis not present

## 2022-10-04 NOTE — Progress Notes (Unsigned)
Cardiology Office Note:    Date:  10/04/2022   ID:  Paul Hardy, DOB Feb 24, 1960, MRN [id number]  PCP:  Everrett Coombe, DO  Cardiologist:  Gypsy Balsam, MD    Referring MD: Everrett Coombe, DO   Chief Complaint  Patient presents with   Medication Management    History of Present Illness:    Paul Hardy is a 62 y.o. male with past medical history significant for coronary artery disease with LIMA to LAD SVG to RCA SVG to obtuse marginal branch, that was done in 2022, mild later he started having pain cardiac catheterization has been performed and he was find to have occluded grafts, last cardiac catheterization done in 2023 showed no target lesion for intervention.  Additional problem include essential hypertension, dyslipidemia, obstructive sleep apnea, he is an ex-smoker he also have depression. Comes today to my office for follow-up overall doing very well.  Denies have any chest pain tightness squeezing pressure burning chest.  Trying to more active.  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 to work since like depression is under control  Past Medical History:  Diagnosis Date   CHF (congestive heart failure) (HCC)     Past Surgical History:  Procedure Laterality Date   CARDIAC SURGERY     CHOLECYSTECTOMY, LAPAROSCOPIC     VASECTOMY      Current Medications: Current Meds  Medication Sig   albuterol (VENTOLIN HFA) 108 (90 Base) MCG/ACT inhaler INHALE 2 PUFFS INTO THE LUNGS EVERY 6 HOURS AS NEEDED FOR WHEEZING (Patient taking differently: Inhale 2 puffs into the lungs every 6 (six) hours as needed for wheezing or shortness of breath.)   AMBULATORY NON FORMULARY MEDICATION Please provide rollator walker.  Diagnosis: Gait instability, CABG R26.81, Z95.1 (Patient taking differently: 1 each by Other route See admin instructions. Please provide rollator walker.  Diagnosis: Gait instability, CABG R26.81, Z95.1)   aspirin 81 MG chewable tablet Chew 81 mg by mouth daily.   atorvastatin  (LIPITOR) 80 MG tablet Take 1 tablet (80 mg total) by mouth at bedtime.   Budeson-Glycopyrrol-Formoterol (BREZTRI AEROSPHERE) 160-9-4.8 MCG/ACT AERO Inhale 2 puffs into the lungs 2 (two) times daily.   clopidogrel (PLAVIX) 75 MG tablet Take 1 tablet (75 mg total) by mouth daily.   DULoxetine (CYMBALTA) 30 MG capsule Take 1 capsule (30 mg total) by mouth daily.   EPINEPHrine 0.3 mg/0.3 mL IJ SOAJ injection Inject 0.3 mg into the muscle as needed for anaphylaxis.   furosemide (LASIX) 40 MG tablet Take 80 mg by mouth daily as needed for edema. 40 mg in the morning and 40mg  night   KLOR-CON M20 20 MEQ tablet Take 20 mEq by mouth daily. As needed   metoprolol succinate (TOPROL-XL) 50 MG 24 hr tablet Take 1 tablet (50 mg total) by mouth daily.   Multiple Vitamin (MULTIVITAMIN) tablet Take 1 tablet by mouth daily.   nitroGLYCERIN (NITROSTAT) 0.4 MG SL tablet Place 0.4 mg under the tongue every 5 (five) minutes as needed for chest pain.   omeprazole (PRILOSEC) 40 MG capsule Take 40 mg by mouth daily.   ondansetron (ZOFRAN ODT) 4 MG disintegrating tablet Take 1 tablet (4 mg total) by mouth every 8 (eight) hours as needed for nausea or vomiting.   ranolazine (RANEXA) 500 MG 12 hr tablet Take 500 mg by mouth 2 (two) times daily.   SYMBICORT 160-4.5 MCG/ACT inhaler INHALE 2 PUFFS INTO THE LUNGS IN THE MORNING AND AT BEDTIME   tadalafil (CIALIS) 20 MG tablet Take 20  mg by mouth as needed for erectile dysfunction.     Allergies:   Bee venom, Iodinated contrast media, Hydrochlorothiazide, and Lisinopril   Social History   Socioeconomic History   Marital status: Married    Spouse name: Not on file   Number of children: Not on file   Years of education: Not on file   Highest education level: Not on file  Occupational History   Not on file  Tobacco Use   Smoking status: Former    Current packs/day: 0.00    Average packs/day: 1 pack/day for 22.8 years (22.8 ttl pk-yrs)    Types: Cigarettes    Start  date: 02/21/1998    Quit date: 11/24/2020    Years since quitting: 1.8    Passive exposure: Never   Smokeless tobacco: Never  Vaping Use   Vaping status: Never Used  Substance and Sexual Activity   Alcohol use: Not Currently   Drug use: Never   Sexual activity: Not on file  Other Topics Concern   Not on file  Social History Narrative   Not on file   Social Determinants of Health   Financial Resource Strain: Patient Declined (05/06/2022)   Received from [facility], [facility]   Overall Financial Resource Strain (CARDIA)    Difficulty of Paying Living Expenses: Patient declined  Food Insecurity: Patient Declined (05/06/2022)   Received from [facility], [facility]   Hunger Vital Sign    Worried About Running Out of Food in the Last Year: Patient declined    Ran Out of Food in the Last Year: Patient declined  Transportation Needs: Patient Declined (05/06/2022)   Received from [facility], [facility]   [facility] - Transportation    Lack of Transportation (Medical): Patient declined    Lack of Transportation (Non-Medical): Patient declined  Physical Activity: Sufficiently Active (03/15/2022)   Received from [facility], [facility]   Exercise Vital Sign    Days of Exercise per Week: 7 days    Minutes of Exercise per Session: 60 min  Stress: No Stress Concern Present (03/15/2022)   Received from [facility], [facility]   [employer] of Occupational Health - Occupational Stress Questionnaire    Feeling of Stress : Only a little  Recent Concern: Stress - Stress Concern Present (01/24/2022)   Received from [facility] of Occupational Health - Occupational Stress Questionnaire    Feeling of Stress : To some extent  Social Connections: Moderately Integrated (03/15/2022)   Received from [facility], [facility]   Social Network    How would you rate your social network (family, work, friends)?: Adequate participation with social  networks     Family History: The patient's family history includes Cancer in his mother; Diabetes in his father; Heart Problems in his father. ROS:   Please see the history of present illness.    All 14 point review of systems negative except as described per history of present illness  EKGs/Labs/Other Studies Reviewed:         Recent Labs: 02/13/2022: B Natriuretic Peptide 25.5 08/12/2022: ALT 14; BUN 16; Creat 1.46; Hemoglobin 15.5; Platelets 214; Potassium 4.0; Sodium 139  Recent Lipid Panel    Component Value Date/Time   CHOL 116 08/12/2022 1005   CHOL 114 03/29/2022 0913   TRIG 136 08/12/2022 1005   HDL 36 (L) 08/12/2022 1005   HDL 33 (L) 03/29/2022 0913   CHOLHDL 3.2 08/12/2022 1005   LDLCALC 58 08/12/2022 1005  Physical Exam:    VS:  BP (!) 158/100 (BP Location: Left Arm, Patient Position: Sitting)   Pulse 84   Ht 6\' 1"  (1.854 m)   Wt 226 lb (102.5 kg)   SpO2 95%   BMI 29.82 kg/m     Wt Readings from Last 3 Encounters:  10/04/22 226 lb (102.5 kg)  09/13/22 220 lb (99.8 kg)  08/12/22 220 lb (99.8 kg)     GEN:  Well nourished, well developed in no acute distress HEENT: Normal NECK: No JVD; No carotid bruits LYMPHATICS: No lymphadenopathy CARDIAC: RRR, no murmurs, no rubs, no gallops RESPIRATORY:  Clear to auscultation without rales, wheezing or rhonchi  ABDOMEN: Soft, non-tender, non-distended MUSCULOSKELETAL:  No edema; No deformity  SKIN: Warm and dry LOWER EXTREMITIES: no swelling NEUROLOGIC:  Alert and oriented x 3 PSYCHIATRIC:  Normal affect   ASSESSMENT:    1. Ischemic cardiomyopathy   2. Essential hypertension   3. Dyslipidemia   4. S/P CABG x 3    PLAN:    In order of problems listed above:  Coronary to disease stable from that point review.  Will continue present guideline directed medical therapy he is on dual antiplatelet therapy which I prefer to continue. History of ischemic cardiomyopathy.  He is thinking maybe [employer] giving  him a problem.  Will repeat echocardiogram if echocardiogram showed preserved left ventricle ejection fraction we will give a trial of temporarily cutting down dose of metoprolol to see if his shortness of breath gets better. Dyslipidemia I did review his K PN which show me his LDL of 58 HDL 36.  He is on high intense statin from Lipitor 80 which I will continue. History of smoking abstain from meth and congratulated him and advised him to continue. We did talk about healthy lifestyle need to exercise I will be more than he does right now.  He tells me that he does about [phone] steps a day which is still clearly not sufficient   Medication Adjustments/Labs and Tests Ordered: Current medicines are reviewed at length with the patient today.  Concerns regarding medicines are outlined above.  No orders of the defined types were placed in this encounter.  Medication changes: No orders of the defined types were placed in this encounter.   Signed, Georgeanna Lea, MD, [facility] 10/04/2022 8:21 AM    [facility]

## 2022-10-04 NOTE — Patient Instructions (Signed)
Medication Instructions:  Your physician recommends that you continue on your current medications as directed. Please refer to the Current Medication list given to you today.  *If you need a refill on your cardiac medications before your next appointment, please call your pharmacy*   Lab Work: None Ordered If you have labs (blood work) drawn today and your tests are completely normal, you will receive your results only by: MyChart Message (if you have MyChart) OR A paper copy in the mail If you have any lab test that is abnormal or we need to change your treatment, we will call you to review the results.   Testing/Procedures: Your physician has requested that you have an echocardiogram. Echocardiography is a painless test that uses sound waves to create images of your heart. It provides your doctor with information about the size and shape of your heart and how well your heart's chambers and valves are working. This procedure takes approximately one hour. There are no restrictions for this procedure. Please do NOT wear cologne, perfume, aftershave, or lotions (deodorant is allowed). Please arrive 15 minutes prior to your appointment time.      Follow-Up: At [facility], you and your health needs are our priority.  As part of our continuing mission to provide you with exceptional heart care, we have created designated Provider Care Teams.  These Care Teams include your primary Cardiologist (physician) and Advanced Practice Providers (APPs -  Physician Assistants and Nurse Practitioners) who all work together to provide you with the care you need, when you need it.  We recommend signing up for the patient portal called "MyChart".  Sign up information is provided on this After Visit Summary.  MyChart is used to connect with patients for Virtual Visits (Telemedicine).  Patients are able to view lab/test results, encounter notes, upcoming appointments, etc.  Non-urgent messages can be sent to  your provider as well.   To learn more about what you can do with MyChart, go to [email or web address].    Your next appointment:   6 month(s)  The format for your next appointment:   In Person  Provider:   Robert Krasowski, MD    Other Instructions NA  

## 2022-10-07 IMAGING — CT CT CHEST HIGH RESOLUTION
3 of 8 series · 16 of 36 positions shown, 19 images · non-contrast
Comparison: None.

CLINICAL DATA: Shortness of breath

EXAM:
CT CHEST WITHOUT CONTRAST
TECHNIQUE: Multidetector CT imaging of the chest was performed following the
standard protocol without intravenous contrast. High resolution
imaging of the lungs, as well as inspiratory and expiratory imaging,
was performed.

[Series 5: thorax standard chest · axial · 0.66mm/px · z∈[-281,-219]mm · 2 of 158 slices shown]
[im 32/158  mediastinal]
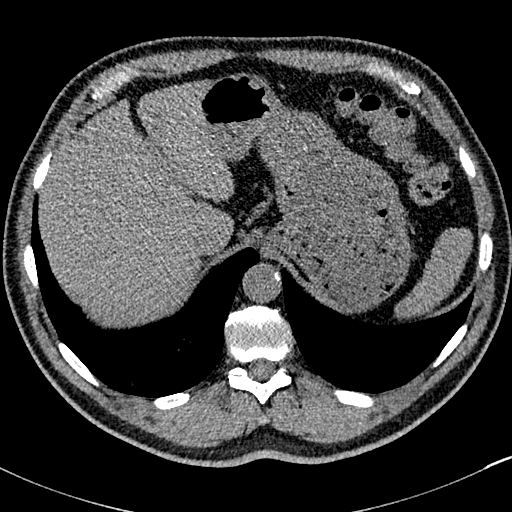
[im 63/158  mediastinal]
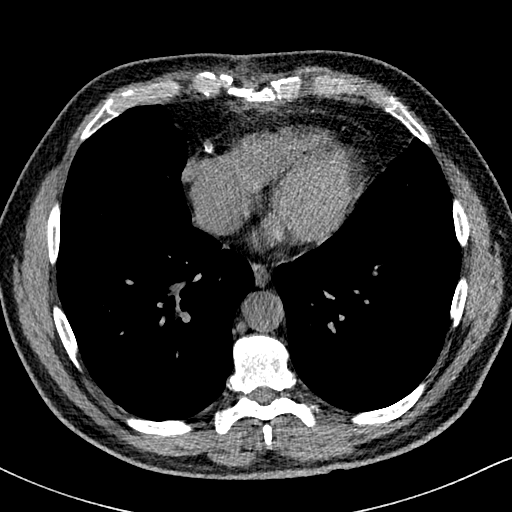

[Series 8: high resolution retro · axial · 0.66mm/px · z∈[-318,-56]mm · 11 of 316 slices shown, 14 images]
[im 27/316  mediastinal]
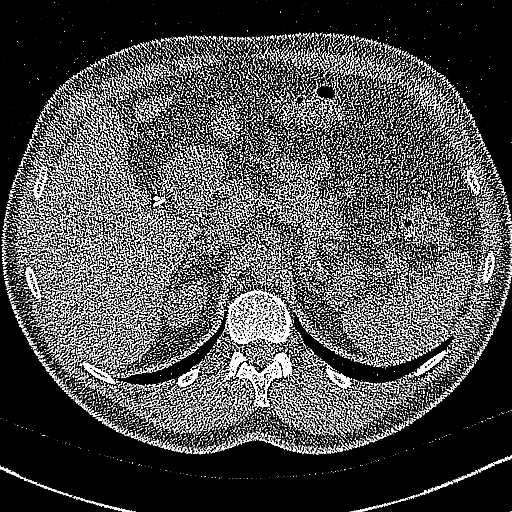
[im 27/316  lung]
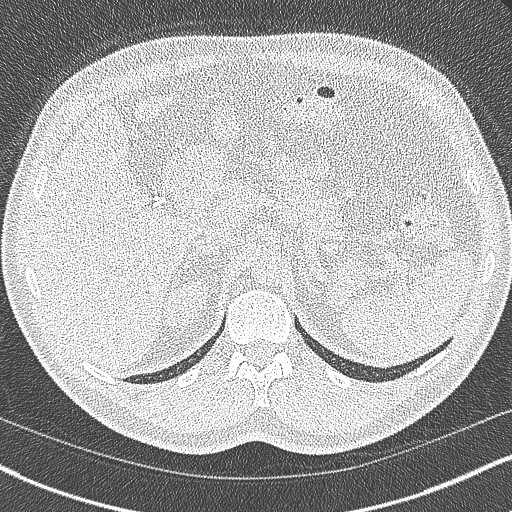
[im 53/316  lung]
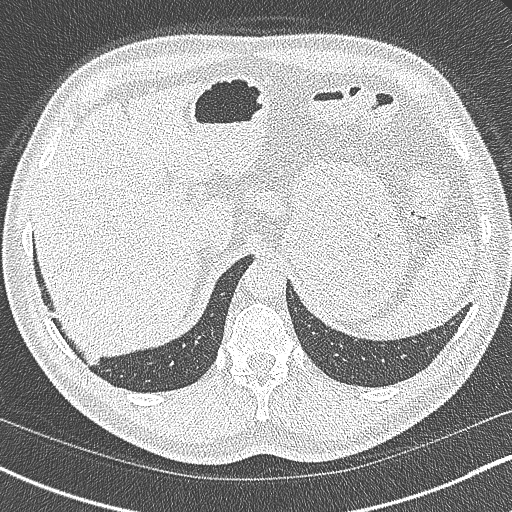
[im 79/316  lung]
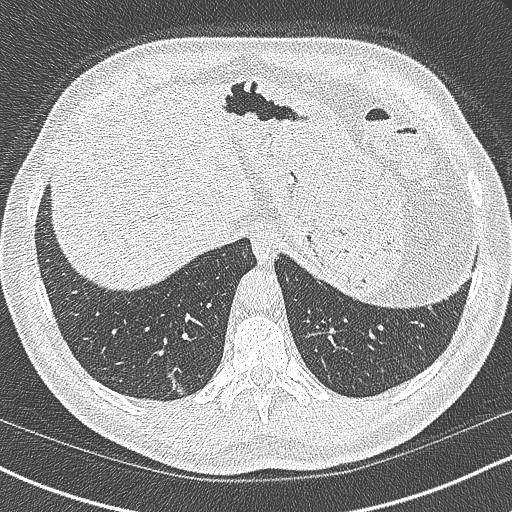
[im 106/316  lung]
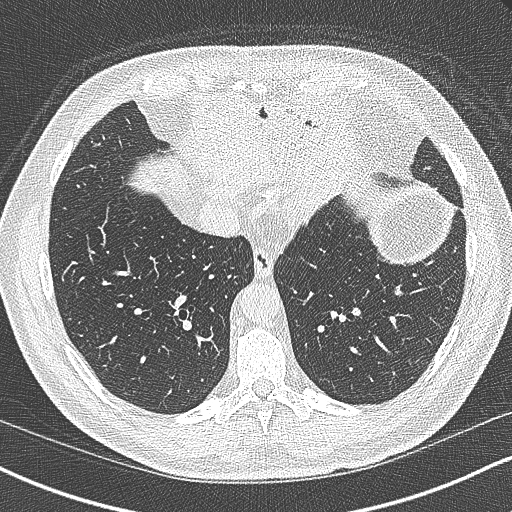
[im 132/316  mediastinal]
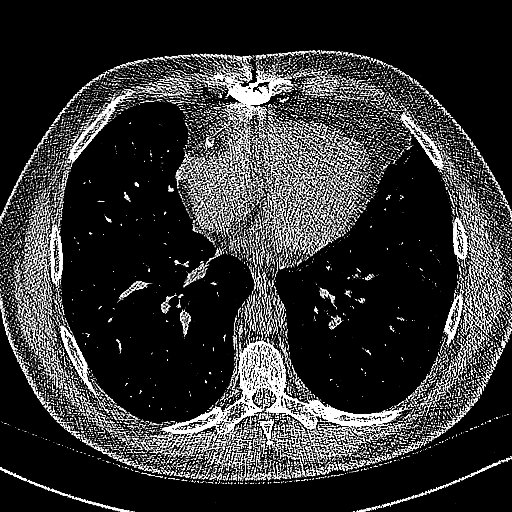
[im 132/316  lung]
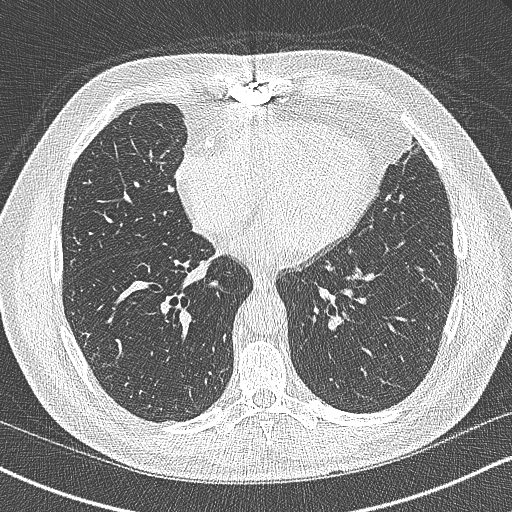
[im 158/316  lung]
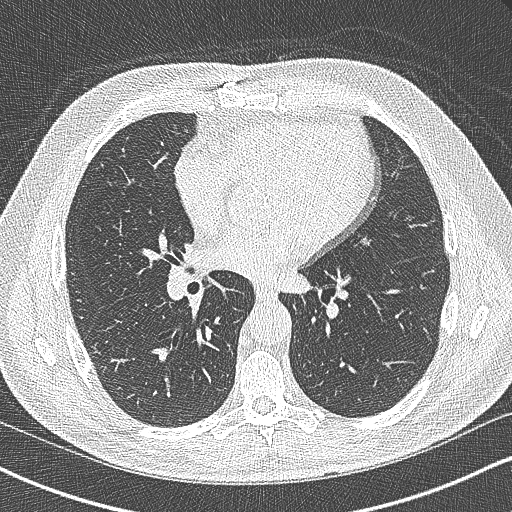
[im 184/316  lung]
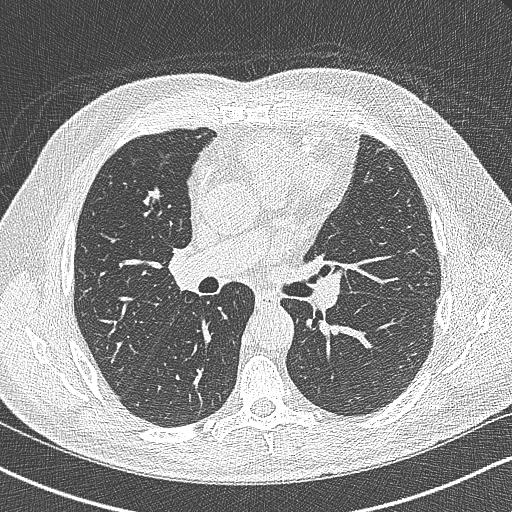
[im 211/316  lung]
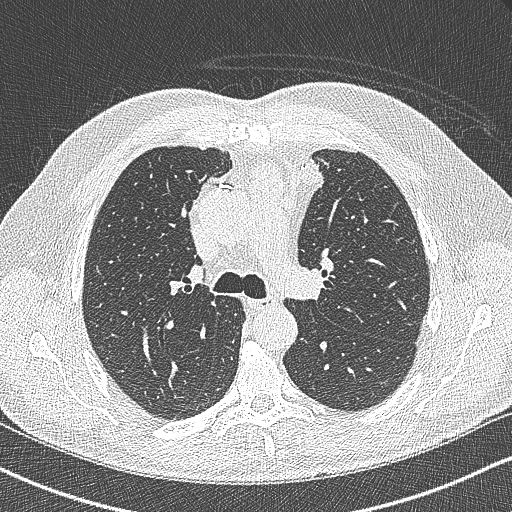
[im 237/316  mediastinal]
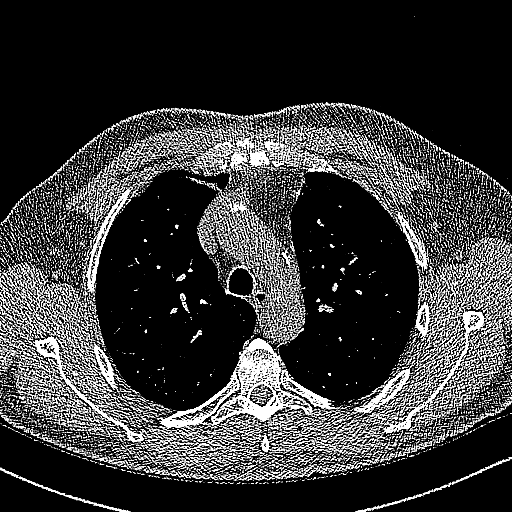
[im 237/316  lung]
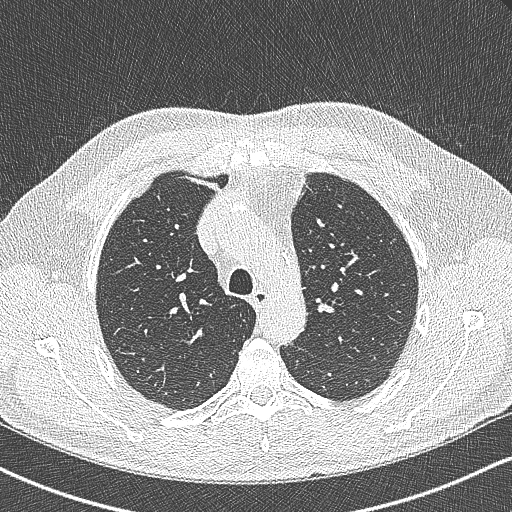
[im 263/316  lung]
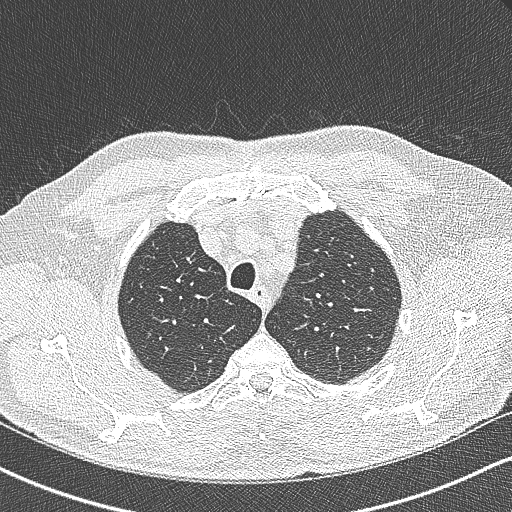
[im 289/316  lung]
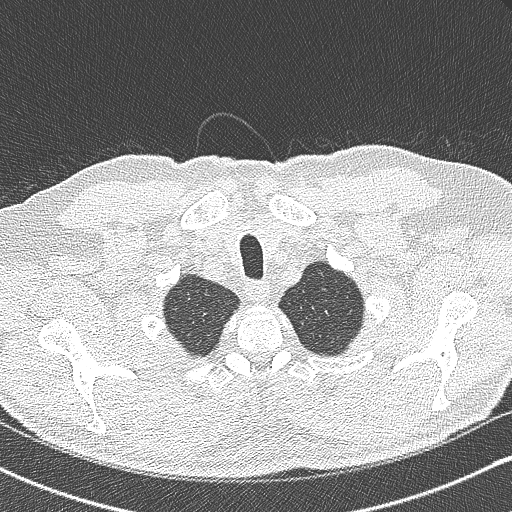

[Series 9: coronal · coronal · 0.62mm/px · 3 of 135 slices shown]
[im 27/135  lung]
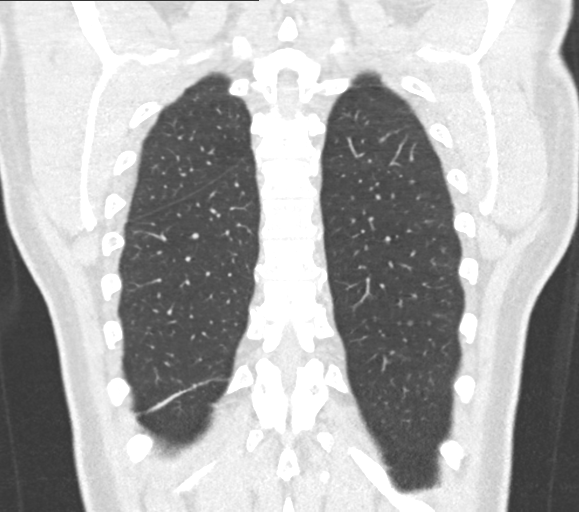
[im 54/135  lung]
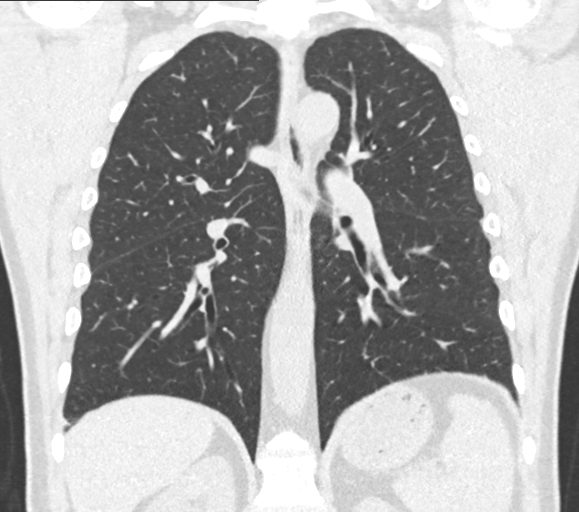
[im 81/135  lung]
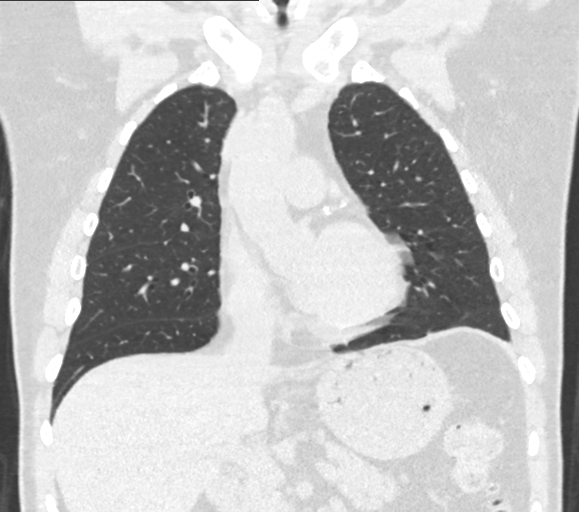

[16 of 36 positions shown; findings below may reference images not displayed]

FINDINGS: Cardiovascular: Normal heart size. No pericardial effusion.
Three-vessel coronary artery calcifications. Status post CABG.
Atherosclerotic disease of the thoracic aorta.

Mediastinum/Nodes: Esophagus and thyroid are unremarkable. No
pathologically enlarged lymph nodes seen in the chest.

Lungs/Pleura: Central airways are patent. No evidence of air
trapping. No consolidation, pleural effusion or pneumothorax. Mild
linear opacities of the right lower lobe and lingula, likely due to
scarring or atelectasis. No evidence of reticular opacities,
traction bronchiectasis or honeycomb change.

Upper Abdomen: Cholecystectomy clips.  No acute abnormality.

Musculoskeletal: Prior median sternotomy with intact sternal wires.
No aggressive appearing osseous lesions.
IMPRESSION: 1. No pulmonary parenchymal findings to explain shortness of breath,
including no evidence of fibrotic interstitial lung disease.
2. Aortic Atherosclerosis ([id number]).

## 2022-11-03 ENCOUNTER — Ambulatory Visit (HOSPITAL_BASED_OUTPATIENT_CLINIC_OR_DEPARTMENT_OTHER)
Admission: RE | Admit: 2022-11-03 | Discharge: 2022-11-03 | Disposition: A | Discharge status: Home / Self Care | Payer: 59 | Source: Ambulatory Visit | Attending: Cardiology | Admitting: Cardiology

## 2022-11-03 DIAGNOSIS — R0609 Other forms of dyspnea: Secondary | ICD-10-CM | POA: Diagnosis not present

## 2022-11-03 LAB — ECHOCARDIOGRAM COMPLETE
AR max vel: 2.94 cm2
AV Area VTI: 2.9 cm2
AV Area mean vel: 2.89 cm2
AV Mean grad: 3 mmHg
AV Peak grad: 5.2 mmHg
Ao pk vel: 1.14 m/s
Area-P 1/2: 3.89 cm2
Calc EF: 62.2 %
S' Lateral: 3.3 cm
Single Plane A2C EF: 66.3 %
Single Plane A4C EF: 59.4 %

## 2022-11-08 ENCOUNTER — Telehealth: Payer: Self-pay

## 2022-11-08 NOTE — Telephone Encounter (Signed)
-----   Message from Gypsy Balsam sent at 11/04/2022 12:18 PM EDT ----- Echocardiogram showed preserved left ventricle ejection fraction overall looks good

## 2022-11-08 NOTE — Telephone Encounter (Signed)
Patient notified of results in office.

## 2022-11-14 ENCOUNTER — Other Ambulatory Visit: Payer: Self-pay

## 2022-11-14 ENCOUNTER — Ambulatory Visit
Admission: EM | Admit: 2022-11-14 | Discharge: 2022-11-14 | Disposition: A | Discharge status: Home / Self Care | Payer: 59 | Attending: Urgent Care | Admitting: Urgent Care

## 2022-11-14 DIAGNOSIS — I951 Orthostatic hypotension: Secondary | ICD-10-CM

## 2022-11-14 DIAGNOSIS — R42 Dizziness and giddiness: Secondary | ICD-10-CM

## 2022-11-14 DIAGNOSIS — R5383 Other fatigue: Secondary | ICD-10-CM

## 2022-11-14 DIAGNOSIS — R531 Weakness: Secondary | ICD-10-CM

## 2022-11-14 DIAGNOSIS — N1831 Chronic kidney disease, stage 3a: Secondary | ICD-10-CM | POA: Diagnosis not present

## 2022-11-14 LAB — POCT URINALYSIS DIP (MANUAL ENTRY)
Blood, UA: NEGATIVE
Glucose, UA: 100 mg/dL — AB
Ketones, POC UA: NEGATIVE mg/dL
Leukocytes, UA: NEGATIVE
Nitrite, UA: NEGATIVE
Protein Ur, POC: 30 mg/dL — AB
Spec Grav, UA: >=1.03 — AB (ref 1.010–1.025)
Urobilinogen, UA: 2 E.U./dL — AB
pH, UA: 6.5 (ref 5.0–8.0)

## 2022-11-14 LAB — POCT FASTING CBG KUC MANUAL ENTRY: POCT Glucose (KUC): 121 mg/dL — AB (ref 70–99)

## 2022-11-14 LAB — POC SARS CORONAVIRUS 2 AG -  ED: SARS Coronavirus 2 Ag: NEGATIVE

## 2022-11-14 LAB — POCT INFLUENZA A/B
Influenza A, POC: NEGATIVE
Influenza B, POC: NEGATIVE

## 2022-11-14 NOTE — ED Provider Notes (Signed)
Paul Hardy CARE    CSN: [id number] Arrival date & time: 11/14/22  1718      History   Chief Complaint Chief Complaint  Patient presents with  . Dizziness    HPI Paul Hardy is a 62 y.o. male.   Pleasant 62 year old male presents today due to concerns of generalized weakness, dizziness, chills, and lethargy.  He states that yesterday around noon time the symptoms started abruptly.  He did get his flu shot several days prior, but has never had a reaction to this in the past.  He does work for [employer] and reports possible exposure to COVID patients.  He does have a cardiac history, and has had several MIs in the past.  He has had chronic dizziness and shortness of breath since his last MI 2 years ago.  Recently had a workup by cardiology, and states everything "looked good".  Patient does have a history of CKD, states recently he has not been drinking much fluids due to his nausea.  He denies any change in bowel movements and denies any vomiting, but states he has had no appetite and has only eaten 2 pop tarts all day, minimal fluids.  He has had significant sweats and chills, but denies a known fever.  He states his body feels rundown and weak, reports generalized myalgias, worse in the back.  He has not tried any over-the-counter medications for his symptoms. He denies any shortness of breath, cough, chest pain or palpitations. He takes lasix on a PRN basis, has not taken this for over a week.    Dizziness   Past Medical History:  Diagnosis Date  . CHF (congestive heart failure) [facility])     Patient Active Problem List   Diagnosis Date Noted  . Reactive airway disease 04/28/2022  . COVID 12/09/2021  . Renal mass 12/09/2021  . OSA on CPAP 12/07/2021  . Raynaud's disease without gangrene 09/28/2021  . Syncopal episodes 08/26/2021  . COPD exacerbation (HCC) 07/29/2021  . Dyslipidemia 02/03/2021  . Ischemic cardiomyopathy 01/18/2021  . SOB (shortness of breath) 01/04/2021   . Adjustment disorder 12/06/2020  . S/P CABG x 3 12/02/2020  . Alcohol use 11/26/2020  . STEMI (ST elevation myocardial infarction) (HCC) 11/25/2020  . Well adult exam 10/21/2020  . ED (erectile dysfunction) 09/23/2020  . Gastroesophageal reflux disease without esophagitis 09/23/2020  . Chronic kidney disease (CKD), stage III (moderate) (HCC) 09/24/2018  . Essential hypertension 09/12/2016    Past Surgical History:  Procedure Laterality Date  . CARDIAC SURGERY    . CHOLECYSTECTOMY, LAPAROSCOPIC    . VASECTOMY         Home Medications    Prior to Admission medications   Medication Sig Start Date End Date Taking? Authorizing Provider  albuterol (VENTOLIN HFA) 108 (90 Base) MCG/ACT inhaler INHALE 2 PUFFS INTO THE LUNGS EVERY 6 HOURS AS NEEDED FOR WHEEZING Patient taking differently: Inhale 2 puffs into the lungs every 6 (six) hours as needed for wheezing or shortness of breath. 03/17/22   Glenford Bayley, NP  AMBULATORY NON FORMULARY MEDICATION Please provide rollator walker.  Diagnosis: Gait instability, CABG R26.81, Z95.1 Patient taking differently: 1 each by Other route See admin instructions. Please provide rollator walker.  Diagnosis: Gait instability, CABG R26.81, Z95.1 12/04/20   Everrett Coombe, DO  aspirin 81 MG chewable tablet Chew 81 mg by mouth daily.    [provider]  atorvastatin (LIPITOR) 80 MG tablet Take 1 tablet (80 mg total) by mouth at bedtime.  09/13/22   Everrett Coombe, DO  Budeson-Glycopyrrol-Formoterol (BREZTRI AEROSPHERE) 160-9-4.8 MCG/ACT AERO Inhale 2 puffs into the lungs 2 (two) times daily. 08/12/22   Jomarie Longs, PA-C  clopidogrel (PLAVIX) 75 MG tablet Take 1 tablet (75 mg total) by mouth daily. 09/13/22   Everrett Coombe, DO  DULoxetine (CYMBALTA) 30 MG capsule Take 1 capsule (30 mg total) by mouth daily. 09/13/22   Everrett Coombe, DO  EPINEPHrine 0.3 mg/0.3 mL IJ SOAJ injection Inject 0.3 mg into the muscle as needed for anaphylaxis.     [provider]  furosemide (LASIX) 40 MG tablet Take 80 mg by mouth daily as needed for edema. 40 mg in the morning and 40mg  night 12/02/20   [provider]  KLOR-CON M20 20 MEQ tablet Take 20 mEq by mouth daily. As needed 12/02/20   [provider]  metoprolol succinate (TOPROL-XL) 50 MG 24 hr tablet Take 1 tablet (50 mg total) by mouth daily. 09/13/22   Everrett Coombe, DO  Multiple Vitamin (MULTIVITAMIN) tablet Take 1 tablet by mouth daily.    [provider]  nitroGLYCERIN (NITROSTAT) 0.4 MG SL tablet Place 0.4 mg under the tongue every 5 (five) minutes as needed for chest pain. 12/25/20   [provider]  omeprazole (PRILOSEC) 40 MG capsule Take 40 mg by mouth daily. 06/25/21   [provider]  ondansetron (ZOFRAN ODT) 4 MG disintegrating tablet Take 1 tablet (4 mg total) by mouth every 8 (eight) hours as needed for nausea or vomiting. 12/04/20   Everrett Coombe, DO  ranolazine (RANEXA) 500 MG 12 hr tablet Take 500 mg by mouth 2 (two) times daily.    [provider]  SYMBICORT 160-4.5 MCG/ACT inhaler INHALE 2 PUFFS INTO THE LUNGS IN THE MORNING AND AT BEDTIME 03/15/22   Everrett Coombe, DO  tadalafil (CIALIS) 20 MG tablet Take 20 mg by mouth as needed for erectile dysfunction. 03/07/22   [provider]    Family History Family History  Problem Relation Age of Onset  . Cancer Mother   . Diabetes Father   . Heart Problems Father     Social History Social History   Tobacco Use  . Smoking status: Former    Current packs/day: 0.00    Average packs/day: 1 pack/day for 22.8 years (22.8 ttl pk-yrs)    Types: Cigarettes    Start date: 02/21/1998    Quit date: 11/24/2020    Years since quitting: 1.9    Passive exposure: Never  . Smokeless tobacco: Never  Vaping Use  . Vaping status: Never Used  Substance Use Topics  . Alcohol use: Not Currently  . Drug use: Never     Allergies   Bee venom, Iodinated contrast media,  Hydrochlorothiazide, and Lisinopril   Review of Systems Review of Systems  Neurological:  Positive for dizziness.     Physical Exam Triage Vital Signs ED Triage Vitals [11/14/22 1746]  Encounter Vitals Group     BP (!) 88/60     Systolic BP Percentile      Diastolic BP Percentile      Pulse Rate 80     Resp 16     Temp 97.9 F (36.6 C)     Temp Source Oral     SpO2 99 %     Weight      Height      Head Circumference      Peak Flow      Pain Score 2  Pain Loc      Pain Education      Exclude from Growth Chart    No data found.  Updated Vital Signs BP (!) 123/90 (BP Location: Left Arm)   Pulse 71   Temp 97.9 F (36.6 C) (Oral)   Resp 16   SpO2 99%   Visual Acuity Right Eye Distance:   Left Eye Distance:   Bilateral Distance:    Right Eye Near:   Left Eye Near:    Bilateral Near:     Physical Exam Vitals and nursing note reviewed. Exam conducted with a chaperone present.  Constitutional:      General: He is not in acute distress.    Appearance: Normal appearance. He is ill-appearing. He is not toxic-appearing or diaphoretic.  HENT:     Head: Normocephalic and atraumatic.     Right Ear: Tympanic membrane, ear canal and external ear normal. There is no impacted cerumen.     Left Ear: Tympanic membrane, ear canal and external ear normal. There is no impacted cerumen.     Nose: Nose normal. No congestion or rhinorrhea.     Mouth/Throat:     Mouth: Mucous membranes are moist.     Pharynx: Oropharynx is clear. No oropharyngeal exudate or posterior oropharyngeal erythema.  Eyes:     General: No scleral icterus.       Right eye: No discharge.        Left eye: No discharge.     Extraocular Movements: Extraocular movements intact.     Conjunctiva/sclera: Conjunctivae normal.     Pupils: Pupils are equal, round, and reactive to light.  Cardiovascular:     Rate and Rhythm: Normal rate and regular rhythm.     Pulses: Normal pulses.  Pulmonary:     Effort:  Pulmonary effort is normal. No respiratory distress.     Breath sounds: Normal breath sounds. No stridor. No wheezing, rhonchi or rales.  Chest:     Chest wall: No tenderness.  Musculoskeletal:     Cervical back: Normal range of motion and neck supple. No rigidity or tenderness.  Lymphadenopathy:     Cervical: No cervical adenopathy.  Skin:    General: Skin is warm and dry.     Coloration: Skin is pale. Skin is not jaundiced.     Findings: No bruising, erythema or rash.  Neurological:     General: No focal deficit present.     Mental Status: He is alert and oriented to person, place, and time.     Sensory: No sensory deficit.     Motor: No weakness.     Gait: Gait normal.     UC Treatments / Results  Labs (all labs ordered are listed, but only abnormal results are displayed) Labs Reviewed  COMPREHENSIVE METABOLIC PANEL  CBC WITH DIFFERENTIAL/PLATELET  POC SARS CORONAVIRUS 2 AG -  ED  POCT INFLUENZA A/B  POCT URINALYSIS DIP (MANUAL ENTRY)    EKG   Radiology No results found.  Procedures Procedures (including critical care time)  Medications Ordered in UC Medications - No data to display  Initial Impression / Assessment and Plan / UC Course  I have reviewed the triage vital signs and the nursing notes.  Pertinent labs & imaging results that were available during my care of the patient were reviewed by me and considered in my medical decision making (see chart for details).     *** Final Clinical Impressions(s) / UC Diagnoses   Final diagnoses:  Dizziness  Weakness  Stage 3a chronic kidney disease [facility])   Discharge Instructions   None    ED Prescriptions   None    PDMP not reviewed this encounter.

## 2022-11-14 NOTE — ED Triage Notes (Signed)
Nausea, dizziness, ears stopped up, weakness, lack of appetite for the past 2 days

## 2022-11-15 LAB — CBC WITH DIFFERENTIAL/PLATELET
Basophils Absolute: 0.1 10*3/uL (ref 0.0–0.2)
Basos: 1 %
EOS (ABSOLUTE): 0.1 10*3/uL (ref 0.0–0.4)
Eos: 1 %
Hematocrit: 51.2 % — ABNORMAL HIGH (ref 37.5–51.0)
Hemoglobin: 17.1 g/dL (ref 13.0–17.7)
Immature Grans (Abs): 0 10*3/uL (ref 0.0–0.1)
Immature Granulocytes: 0 %
Lymphocytes Absolute: 2 10*3/uL (ref 0.7–3.1)
Lymphs: 19 %
MCH: 30.8 pg (ref 26.6–33.0)
MCHC: 33.4 g/dL (ref 31.5–35.7)
MCV: 92 fL (ref 79–97)
Monocytes Absolute: 0.7 10*3/uL (ref 0.1–0.9)
Monocytes: 7 %
Neutrophils Absolute: 7.4 10*3/uL — ABNORMAL HIGH (ref 1.4–7.0)
Neutrophils: 72 %
Platelets: 239 10*3/uL (ref 150–450)
RBC: 5.55 x10E6/uL (ref 4.14–5.80)
RDW: 12.5 % (ref 11.6–15.4)
WBC: 10.3 10*3/uL (ref 3.4–10.8)

## 2022-11-15 LAB — COMPREHENSIVE METABOLIC PANEL
ALT: 21 IU/L (ref 0–44)
AST: 23 IU/L (ref 0–40)
Albumin: 4.6 g/dL (ref 3.9–4.9)
Alkaline Phosphatase: 126 IU/L — ABNORMAL HIGH (ref 44–121)
BUN/Creatinine Ratio: 10 (ref 10–24)
BUN: 16 mg/dL (ref 8–27)
Bilirubin Total: 1 mg/dL (ref 0.0–1.2)
CO2: 23 mmol/L (ref 20–29)
Calcium: 9.8 mg/dL (ref 8.6–10.2)
Chloride: 101 mmol/L (ref 96–106)
Creatinine, Ser: 1.59 mg/dL — ABNORMAL HIGH (ref 0.76–1.27)
Globulin, Total: 2.4 g/dL (ref 1.5–4.5)
Glucose: 114 mg/dL — ABNORMAL HIGH (ref 70–99)
Potassium: 4.9 mmol/L (ref 3.5–5.2)
Sodium: 141 mmol/L (ref 134–144)
Total Protein: 7 g/dL (ref 6.0–8.5)
eGFR: 49 mL/min/{1.73_m2} — ABNORMAL LOW (ref >59)

## 2022-11-15 NOTE — Discharge Instructions (Addendum)
Your urinalysis does not indicate infection, however it does indicate dehydration. This is likely contributing to your dizziness, postural hypotension, and weakness. Your glucose is normal. Your flu and COVID test are negative. We will draw labs to further assess possible causes of your symptoms. I suspect this to be viral in nature. Please REST! Out of work x 3 days. Please increase your water intake.  Electrolyte drinks such as propel or pedialyte may be helpful.   Should you develop fever, worsening weakness and fatigue, worsening nausea, low blood pressure or any new symptoms, please head to ER. Consider follow up with PCP should your symptoms persist >5 days.

## 2022-11-23 ENCOUNTER — Encounter: Payer: Self-pay | Admitting: Primary Care

## 2022-12-07 ENCOUNTER — Other Ambulatory Visit: Payer: Self-pay

## 2022-12-07 ENCOUNTER — Other Ambulatory Visit: Payer: Self-pay | Admitting: Physician Assistant

## 2022-12-07 DIAGNOSIS — J411 Mucopurulent chronic bronchitis: Secondary | ICD-10-CM

## 2022-12-07 DIAGNOSIS — J441 Chronic obstructive pulmonary disease with (acute) exacerbation: Secondary | ICD-10-CM

## 2022-12-07 MED ORDER — BREZTRI AEROSPHERE 160-9-4.8 MCG/ACT IN AERO
2.0000 | INHALATION_SPRAY | Freq: Two times a day (BID) | RESPIRATORY_TRACT | 0 refills | Status: DC
  Filled 2022-12-07: qty 10.7, 30d supply, fill #0
  Filled 2023-01-02: qty 10.7, 30d supply, fill #1

## 2022-12-07 NOTE — Telephone Encounter (Signed)
Requesting rx rf of Breztri Last written 08/12/2022 Last OV 09/13/2022 Upcoming appt schld. 03/16/2023

## 2022-12-08 ENCOUNTER — Encounter: Payer: Self-pay | Admitting: Urology

## 2022-12-09 ENCOUNTER — Other Ambulatory Visit: Payer: Self-pay

## 2023-01-02 ENCOUNTER — Other Ambulatory Visit: Payer: Self-pay | Admitting: Family Medicine

## 2023-01-02 DIAGNOSIS — I255 Ischemic cardiomyopathy: Secondary | ICD-10-CM

## 2023-01-02 DIAGNOSIS — Z951 Presence of aortocoronary bypass graft: Secondary | ICD-10-CM

## 2023-01-03 ENCOUNTER — Other Ambulatory Visit (HOSPITAL_COMMUNITY): Payer: Self-pay

## 2023-01-03 MED ORDER — CLOPIDOGREL BISULFATE 75 MG PO TABS
75.0000 mg | ORAL_TABLET | Freq: Every day | ORAL | 0 refills | Status: DC
  Filled 2023-01-03 – 2023-03-28 (×4): qty 90, 90d supply, fill #0

## 2023-01-04 ENCOUNTER — Other Ambulatory Visit: Payer: Self-pay

## 2023-03-02 ENCOUNTER — Other Ambulatory Visit: Payer: Self-pay | Admitting: Family Medicine

## 2023-03-02 ENCOUNTER — Other Ambulatory Visit (HOSPITAL_COMMUNITY): Payer: Self-pay

## 2023-03-02 ENCOUNTER — Other Ambulatory Visit: Payer: Self-pay

## 2023-03-03 ENCOUNTER — Other Ambulatory Visit (HOSPITAL_COMMUNITY): Payer: Self-pay

## 2023-03-03 ENCOUNTER — Other Ambulatory Visit: Payer: Self-pay

## 2023-03-09 ENCOUNTER — Other Ambulatory Visit: Payer: Self-pay

## 2023-03-09 ENCOUNTER — Encounter: Payer: Self-pay | Admitting: Pharmacist

## 2023-03-14 ENCOUNTER — Other Ambulatory Visit: Payer: Self-pay

## 2023-03-16 ENCOUNTER — Encounter: Payer: Self-pay | Admitting: Family Medicine

## 2023-03-16 ENCOUNTER — Ambulatory Visit (INDEPENDENT_AMBULATORY_CARE_PROVIDER_SITE_OTHER): Payer: 59 | Admitting: Family Medicine

## 2023-03-16 ENCOUNTER — Encounter: Payer: Self-pay | Admitting: Pharmacist

## 2023-03-16 ENCOUNTER — Other Ambulatory Visit: Payer: Self-pay

## 2023-03-16 VITALS — BP 132/84 | Ht 73.0 in | Wt 229.0 lb

## 2023-03-16 DIAGNOSIS — J449 Chronic obstructive pulmonary disease, unspecified: Secondary | ICD-10-CM | POA: Diagnosis not present

## 2023-03-16 DIAGNOSIS — Z23 Encounter for immunization: Secondary | ICD-10-CM | POA: Diagnosis not present

## 2023-03-16 DIAGNOSIS — E785 Hyperlipidemia, unspecified: Secondary | ICD-10-CM

## 2023-03-16 DIAGNOSIS — I255 Ischemic cardiomyopathy: Secondary | ICD-10-CM

## 2023-03-16 DIAGNOSIS — Z87891 Personal history of nicotine dependence: Secondary | ICD-10-CM | POA: Diagnosis not present

## 2023-03-16 DIAGNOSIS — F4322 Adjustment disorder with anxiety: Secondary | ICD-10-CM | POA: Diagnosis not present

## 2023-03-16 DIAGNOSIS — I10 Essential (primary) hypertension: Secondary | ICD-10-CM

## 2023-03-16 MED ORDER — NITROGLYCERIN 0.4 MG SL SUBL
0.4000 mg | SUBLINGUAL_TABLET | SUBLINGUAL | 0 refills | Status: DC | PRN
  Filled 2023-03-16: qty 25, 7d supply, fill #0

## 2023-03-16 NOTE — Assessment & Plan Note (Signed)
Stable with duloxetine.  Better since return to work.

## 2023-03-16 NOTE — Assessment & Plan Note (Signed)
BP remains well controlled.  Continue metoprolol at current strength.

## 2023-03-16 NOTE — Progress Notes (Signed)
Paul Hardy - 63 y.o. male MRN [id number]  Date of birth: [birth year]  Subjective Chief Complaint  Patient presents with   Hypertension   Mood    HPI Paul Hardy is a 63 y.o. male here today for follow up visit.     He reports that he is doing well.    He has history of CAD with previous MI.  Continues to see cardiology.  He remains on metoprolol and ranexa.  Antiplatelt therapy with plavix and asa.  His BP is well controlled.  He denies new or worsening chest pain, dyspnea, palpitations.    Mood is stable with duloxetine at current strength.  No significant side effects.   History of smoking, quit in 2022. He does have COPD and uses symbicort daily with albuterol as needed.  He is interested in lung cancer screening.  He would like to have pneumonia vaccine today.   ROS:  A comprehensive ROS was completed and negative except as noted per HPI  Allergies  Allergen Reactions   Bee Venom Anaphylaxis   Iodinated Contrast Media Anaphylaxis    Rash and Shortness of Breath   Hydrochlorothiazide Other (See Comments)    Dehydration and muscle spasms. Lasix does the same   Lisinopril Cough    Losartan is OK    Past Medical History:  Diagnosis Date   CHF (congestive heart failure) (HCC)     Past Surgical History:  Procedure Laterality Date   CARDIAC SURGERY     CHOLECYSTECTOMY, LAPAROSCOPIC     VASECTOMY      Social History   Socioeconomic History   Marital status: Married    Spouse name: Not on file   Number of children: Not on file   Years of education: Not on file   Highest education level: Associate degree: academic program  Occupational History   Not on file  Tobacco Use   Smoking status: Former    Current packs/day: 0.00    Average packs/day: 1 pack/day for 22.8 years (22.8 ttl pk-yrs)    Types: Cigarettes    Start date: 02/21/1998    Quit date: 11/24/2020    Years since quitting: 2.3    Passive exposure: Never   Smokeless tobacco: Never  Vaping Use    Vaping status: Never Used  Substance and Sexual Activity   Alcohol use: Not Currently   Drug use: Never   Sexual activity: Not on file  Other Topics Concern   Not on file  Social History Narrative   Not on file   Social Drivers of Health   Financial Resource Strain: Low Risk  (03/09/2023)   Overall Financial Resource Strain (CARDIA)    Difficulty of Paying Living Expenses: Not hard at all  Food Insecurity: No Food Insecurity (03/09/2023)   Hunger Vital Sign    Worried About Running Out of Food in the Last Year: Never true    Ran Out of Food in the Last Year: Never true  Transportation Needs: No Transportation Needs (03/09/2023)   PRAPARE - Administrator, Civil Service (Medical): No    Lack of Transportation (Non-Medical): No  Physical Activity: Sufficiently Active (03/09/2023)   Exercise Vital Sign    Days of Exercise per Week: 4 days    Minutes of Exercise per Session: 60 min  Stress: No Stress Concern Present (03/09/2023)   [employer] of Occupational Health - Occupational Stress Questionnaire    Feeling of Stress : Only a little  Social Connections: Moderately Integrated (03/09/2023)  Social Connection and Isolation Panel [NHANES]    Frequency of Communication with Friends and Family: Once a week    Frequency of Social Gatherings with Friends and Family: Once a week    Attends Religious Services: 1 to 4 times per year    Active Member of [employer] or Organizations: Yes    Attends Engineer, structural: More than 4 times per year    Marital Status: Married    Family History  Problem Relation Age of Onset   Cancer Mother    Diabetes Father    Heart Problems Father     Health Maintenance  Topic Date Due   Pneumococcal Vaccine 26-32 Years old (1 of 2 - PCV) Never done   INFLUENZA VACCINE  09/22/2022   COVID-19 Vaccine (6 - 2024-25 season) 10/23/2022   Lung Cancer Screening  03/31/2023   HIV Screening  09/13/2023 (Originally 08[birth year]20/1975)   Fecal DNA  (Cologuard)  11/04/2023   DTaP/Tdap/Td (3 - Td or Tdap) 06/07/2029   Hepatitis C Screening  Completed   Zoster Vaccines- Shingrix  Completed   HPV VACCINES  Aged Out     ----------------------------------------------------------------------------------------------------------------------------------------------------------------------------------------------------------------- Physical Exam BP 132/84 (BP Location: Left Arm, Patient Position: Sitting, Cuff Size: Large)   Ht 6\' 1"  (1.854 m)   Wt 229 lb (103.9 kg)   BMI 30.21 kg/m   Physical Exam Constitutional:      Appearance: Normal appearance.  HENT:     Head: Normocephalic and atraumatic.  Cardiovascular:     Rate and Rhythm: Normal rate and regular rhythm.  Pulmonary:     Effort: Pulmonary effort is normal.     Breath sounds: Normal breath sounds.  Neurological:     General: No focal deficit present.     Mental Status: He is alert.  Psychiatric:        Mood and Affect: Mood normal.        Behavior: Behavior normal.     ------------------------------------------------------------------------------------------------------------------------------------------------------------------------------------------------------------------- Assessment and Plan  Essential hypertension BP remains well controlled.  Continue metoprolol at current strength.    Adjustment disorder Stable with duloxetine.  Better since return to work.    COPD (chronic obstructive pulmonary disease) (HCC) Continue current inhalers.  Referral placed for lung cancer screening as well.   Dyslipidemia Continue atorvastatin at current strength.    Ischemic cardiomyopathy Stable at this time.  Euvolemic on exam. Continue current medications.     No orders of the defined types were placed in this encounter.   No follow-ups on file.    This visit occurred during the SARS-CoV-2 public health emergency.  Safety protocols were in place, including  screening questions prior to the visit, additional usage of staff PPE, and extensive cleaning of exam room while observing appropriate contact time as indicated for disinfecting solutions.

## 2023-03-16 NOTE — Assessment & Plan Note (Signed)
Continue atorvastatin at current strength.  

## 2023-03-16 NOTE — Assessment & Plan Note (Signed)
Stable at this time.  Euvolemic on exam. Continue current medications.

## 2023-03-16 NOTE — Assessment & Plan Note (Signed)
Continue current inhalers.  Referral placed for lung cancer screening as well.

## 2023-03-17 ENCOUNTER — Telehealth: Payer: Self-pay | Admitting: Primary Care

## 2023-03-17 ENCOUNTER — Other Ambulatory Visit: Payer: Self-pay | Admitting: *Deleted

## 2023-03-17 ENCOUNTER — Telehealth: Payer: Self-pay | Admitting: *Deleted

## 2023-03-17 ENCOUNTER — Other Ambulatory Visit (HOSPITAL_COMMUNITY): Payer: Self-pay

## 2023-03-17 DIAGNOSIS — Z87891 Personal history of nicotine dependence: Secondary | ICD-10-CM

## 2023-03-17 DIAGNOSIS — J4530 Mild persistent asthma, uncomplicated: Secondary | ICD-10-CM

## 2023-03-17 DIAGNOSIS — Z122 Encounter for screening for malignant neoplasm of respiratory organs: Secondary | ICD-10-CM

## 2023-03-17 DIAGNOSIS — J432 Centrilobular emphysema: Secondary | ICD-10-CM

## 2023-03-17 NOTE — Telephone Encounter (Signed)
Lung Cancer Screening Narrative/Criteria Questionnaire (Cigarette Smokers Only- No Cigars/Pipes/vapes)   Paul Hardy   SDMV:03/29/23 8:45      [name]  09/01/1960              LDCT: 04/03/23 8:30    62 y.o.   Phone: [phone]  Lung Screening Narrative (confirm age 75-77 yrs Medicare / 50-80 yrs Private pay insurance)   Insurance information:[email or web address]   Referring Provider:cody [email or web address]   This screening involves an initial phone call with a team member from our program. It is called a shared decision making visit. The initial meeting is required by  insurance and Medicare to make sure you understand the program. This appointment takes about 15-20 minutes to complete. You will complete the screening scan at your scheduled date/time.  This scan takes about 5-10 minutes to complete. You can eat and drink normally before and after the scan.  Criteria questions for Lung Cancer Screening:   Are you a current or former smoker? Former Age began smoking: 18   If you are a former smoker, what year did you quit smoking? 2021 (within 15 yrs)   To calculate your smoking history, I need an accurate estimate of how many packs of cigarettes you smoked per day and for how many years. (Not just the number of PPD you are now smoking)   Years smoking 40 x Packs per day 1 = Pack years 40   (at least 20 pack yrs)   (Make sure they understand that we need to know how much they have smoked in the past, not just the number of PPD they are smoking now)  Do you have a personal history of cancer?  No    Do you have a family history of cancer? No  Are you coughing up blood?  No  Have you had unexplained weight loss of 15 lbs or more in the last 6 months? No  It looks like you meet all criteria.  When would be a good time for [place] to schedule you for this screening?   Additional information: N/A

## 2023-03-17 NOTE — Telephone Encounter (Signed)
Pt needs PFT set up at hospital before his  March appt. per Fredric Mare. TY

## 2023-03-20 ENCOUNTER — Ambulatory Visit: Payer: 59 | Admitting: Primary Care

## 2023-03-21 ENCOUNTER — Other Ambulatory Visit (HOSPITAL_COMMUNITY): Payer: Self-pay

## 2023-03-27 ENCOUNTER — Ambulatory Visit (HOSPITAL_COMMUNITY)
Admission: RE | Admit: 2023-03-27 | Discharge: 2023-03-27 | Disposition: A | Discharge status: Home / Self Care | Payer: 59 | Source: Ambulatory Visit | Attending: Primary Care | Admitting: Primary Care

## 2023-03-27 DIAGNOSIS — J4530 Mild persistent asthma, uncomplicated: Secondary | ICD-10-CM | POA: Insufficient documentation

## 2023-03-27 DIAGNOSIS — J432 Centrilobular emphysema: Secondary | ICD-10-CM | POA: Diagnosis not present

## 2023-03-27 LAB — PULMONARY FUNCTION TEST
DL/VA % pred: 83 %
DL/VA: 3.47 ml/min/mmHg/L
DLCO unc % pred: 54 %
DLCO unc: 16.39 ml/min/mmHg
FEF 25-75 Post: 3.31 L/s
FEF 25-75 Pre: 2.74 L/s
FEF2575-%Change-Post: 21 %
FEF2575-%Pred-Post: 104 %
FEF2575-%Pred-Pre: 86 %
FEV1-%Change-Post: 5 %
FEV1-%Pred-Post: 66 %
FEV1-%Pred-Pre: 63 %
FEV1-Post: 2.62 L
FEV1-Pre: 2.49 L
FEV1FVC-%Change-Post: 1 %
FEV1FVC-%Pred-Pre: 109 %
FEV6-%Change-Post: 4 %
FEV6-%Pred-Post: 63 %
FEV6-%Pred-Pre: 60 %
FEV6-Post: 3.15 L
FEV6-Pre: 3.02 L
FEV6FVC-%Pred-Post: 104 %
FEV6FVC-%Pred-Pre: 104 %
FVC-%Change-Post: 4 %
FVC-%Pred-Post: 60 %
FVC-%Pred-Pre: 57 %
FVC-Post: 3.15 L
FVC-Pre: 3.02 L
Post FEV1/FVC ratio: 83 %
Post FEV6/FVC ratio: 100 %
Pre FEV1/FVC ratio: 82 %
Pre FEV6/FVC Ratio: 100 %
RV % pred: 123 %
RV: 3.01 L
TLC % pred: 80 %
TLC: 6.11 L

## 2023-03-27 MED ORDER — ALBUTEROL SULFATE (2.5 MG/3ML) 0.083% IN NEBU
2.5000 mg | INHALATION_SOLUTION | Freq: Once | RESPIRATORY_TRACT | Status: AC
  Administered 2023-03-27: 2.5 mg via RESPIRATORY_TRACT

## 2023-03-28 ENCOUNTER — Other Ambulatory Visit (HOSPITAL_COMMUNITY): Payer: Self-pay

## 2023-03-29 ENCOUNTER — Ambulatory Visit: Payer: 59 | Admitting: Adult Health

## 2023-03-29 ENCOUNTER — Encounter: Payer: Self-pay | Admitting: Adult Health

## 2023-03-29 DIAGNOSIS — Z87891 Personal history of nicotine dependence: Secondary | ICD-10-CM | POA: Diagnosis not present

## 2023-03-29 NOTE — Patient Instructions (Signed)

## 2023-03-29 NOTE — Progress Notes (Signed)
  Virtual Visit via Telephone Note  I connected with Paul Hardy , 03/29/23 8:43 AM by a telemedicine application and verified that I am speaking with the correct person using two identifiers.  Location: Patient: home Provider: home   I discussed the limitations of evaluation and management by telemedicine and the availability of in person appointments. The patient expressed understanding and agreed to proceed.   Shared Decision Making Visit Lung Cancer Screening Program [phone])   Eligibility: 63 y.o. Pack Years Smoking History Calculation = 40 pack years (# packs/per year x # years smoked) Recent History of coughing up blood  no Unexplained weight loss? no ( >Than 15 pounds within the last 6 months ) Prior History Lung / other cancer no (Diagnosis within the last 5 years already requiring surveillance chest CT Scans). Smoking Status Former Smoker Former Smokers: Years since quit: 4 years  Quit Date: 2021  Visit Components: Discussion included one or more decision making aids. YES Discussion included risk/benefits of screening. YES Discussion included potential follow up diagnostic testing for abnormal scans. YES Discussion included meaning and risk of over diagnosis. YES Discussion included meaning and risk of False Positives. YES Discussion included meaning of total radiation exposure. YES  Counseling Included: Importance of adherence to annual lung cancer LDCT screening. YES Impact of comorbidities on ability to participate in the program. YES Ability and willingness to under diagnostic treatment. YES  Smoking Cessation Counseling: Former Smokers:  Discussed the importance of maintaining cigarette abstinence. yes Diagnosis Code: Personal History of Nicotine Dependence. S12.108 Information about tobacco cessation classes and interventions provided to patient. Yes Patient provided with ticket for LDCT Scan. yes Written Order for Lung Cancer Screening with LDCT  placed in Epic. Yes (CT Chest Lung Cancer Screening Low Dose W/O CM) [id number]  Z12.2-Screening of respiratory organs Z87.891-Personal history of nicotine dependence   Paul Hardy 03/29/23

## 2023-03-30 NOTE — Telephone Encounter (Signed)
 Patient just had one at the hospital

## 2023-03-30 NOTE — Telephone Encounter (Signed)
 Please see last msg. TY.

## 2023-04-03 ENCOUNTER — Ambulatory Visit: Payer: 59

## 2023-04-03 DIAGNOSIS — Z87891 Personal history of nicotine dependence: Secondary | ICD-10-CM | POA: Diagnosis not present

## 2023-04-03 DIAGNOSIS — Z122 Encounter for screening for malignant neoplasm of respiratory organs: Secondary | ICD-10-CM | POA: Diagnosis not present

## 2023-04-03 DIAGNOSIS — J439 Emphysema, unspecified: Secondary | ICD-10-CM

## 2023-04-03 DIAGNOSIS — I700 Atherosclerosis of aorta: Secondary | ICD-10-CM | POA: Diagnosis not present

## 2023-04-17 ENCOUNTER — Other Ambulatory Visit: Payer: Self-pay

## 2023-04-17 ENCOUNTER — Ambulatory Visit
Admission: EM | Admit: 2023-04-17 | Discharge: 2023-04-17 | Disposition: A | Discharge status: Home / Self Care | Payer: 59 | Attending: Physician Assistant | Admitting: Physician Assistant

## 2023-04-17 ENCOUNTER — Ambulatory Visit (INDEPENDENT_AMBULATORY_CARE_PROVIDER_SITE_OTHER): Payer: 59

## 2023-04-17 DIAGNOSIS — J441 Chronic obstructive pulmonary disease with (acute) exacerbation: Secondary | ICD-10-CM | POA: Diagnosis not present

## 2023-04-17 DIAGNOSIS — Z122 Encounter for screening for malignant neoplasm of respiratory organs: Secondary | ICD-10-CM

## 2023-04-17 DIAGNOSIS — J449 Chronic obstructive pulmonary disease, unspecified: Secondary | ICD-10-CM | POA: Diagnosis not present

## 2023-04-17 DIAGNOSIS — R9389 Abnormal findings on diagnostic imaging of other specified body structures: Secondary | ICD-10-CM | POA: Diagnosis not present

## 2023-04-17 DIAGNOSIS — Z8709 Personal history of other diseases of the respiratory system: Secondary | ICD-10-CM

## 2023-04-17 DIAGNOSIS — R059 Cough, unspecified: Secondary | ICD-10-CM

## 2023-04-17 DIAGNOSIS — Z87891 Personal history of nicotine dependence: Secondary | ICD-10-CM

## 2023-04-17 LAB — POC SARS CORONAVIRUS 2 AG -  ED: SARS Coronavirus 2 Ag: NEGATIVE

## 2023-04-17 LAB — POCT INFLUENZA A/B
Influenza A, POC: NEGATIVE
Influenza B, POC: NEGATIVE

## 2023-04-17 MED ORDER — PREDNISONE 20 MG PO TABS: 40.0000 mg | ORAL_TABLET | Freq: Every day | ORAL | 0 refills | Status: DC

## 2023-04-17 MED ORDER — BENZONATATE 100 MG PO CAPS: 100.0000 mg | ORAL_CAPSULE | Freq: Three times a day (TID) | ORAL | 0 refills | Status: DC

## 2023-04-17 MED ORDER — CEFPODOXIME PROXETIL 200 MG PO TABS: 200.0000 mg | ORAL_TABLET | Freq: Two times a day (BID) | ORAL | 0 refills | Status: DC

## 2023-04-17 NOTE — Discharge Instructions (Signed)
 You are negative for flu and COVID.  Your chest x-ray was normal with no evidence of pneumonia.  I am concerned that you have an exacerbation of underlying chronic lung condition.  Start cefpodoxime twice daily for 7 days.  Start prednisone 40 mg for 5 days.  Do not take NSAIDs with this medication including aspirin, ibuprofen/Advil, naproxen/Aleve.  You can use Mucinex, Flonase, Tylenol for symptom relief.  If your symptoms are not improving within a week or if anything worsens and you have worsening cough, shortness of breath, chest pain, nausea, vomiting, weakness you need to be seen immediately.

## 2023-04-17 NOTE — ED Provider Notes (Signed)
 [name]    CSN: [id number] Arrival date & time: 04/17/23  0801      History   Chief Complaint Chief Complaint  Patient presents with   Cough    HPI Paul Hardy is a 63 y.o. male.   Patient presents today with a 2-day history of URI symptoms including nasal congestion, sore throat, cough, wheezing, headache, nausea.  He denies any fever, vomiting, chest pain, weakness.  He has had some shortness of breath that has not responded to his albuterol.  He has a history of COPD and is currently treated with [facility] which she reports compliance with.  He has also taken Tylenol over-the-counter without improvement of symptoms.  He is up-to-date on COVID-19 vaccines including most recent booster.  He has had his influenza vaccine as well as pneumonia vaccination.  Denies any recent antibiotics or steroids.  He last had COVID several years ago.  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 Authorizing Provider  benzonatate (TESSALON) 100 MG capsule Take 1 capsule (100 mg total) by mouth every 8 (eight) hours. 04/17/23  Yes Carisa Backhaus K, PA-C  cefpodoxime (VANTIN) 200 MG tablet Take 1 tablet (200 mg total) by mouth 2 (two) times daily for 7 days. 04/17/23 04/24/23 Yes Atticus Lemberger, Denny Peon K, PA-C  predniSONE (DELTASONE) 20 MG tablet Take 2 tablets (40 mg total) by mouth daily for 5 days. 04/17/23 04/22/23 Yes Shonita Rinck K, PA-C  albuterol (VENTOLIN HFA) 108 (90 Base) MCG/ACT inhaler INHALE 2 PUFFS INTO THE LUNGS EVERY 6 HOURS AS NEEDED FOR WHEEZING Patient taking differently: Inhale 2 puffs into the lungs every 6 (six) hours as needed for wheezing or shortness of breath. 03/17/22   Glenford Bayley, NP  AMBULATORY NON FORMULARY MEDICATION Please provide rollator walker.  Diagnosis: Gait instability, CABG R26.81, Z95.1 Patient taking differently: 1 each by Other route See admin instructions. Please provide rollator walker.  Diagnosis: Gait instability, CABG R26.81, Z95.1 12/04/20   Everrett Coombe, DO  aspirin 81 MG chewable tablet Chew 81 mg by mouth daily.    [provider]  atorvastatin (LIPITOR) 80 MG tablet Take 1 tablet (80 mg total) by mouth at bedtime. 09/13/22   Everrett Coombe, DO  Budeson-Glycopyrrol-Formoterol (BREZTRI AEROSPHERE) 160-9-4.8 MCG/ACT  AERO Inhale 2 puffs into the lungs 2 (two) times daily. 12/07/22   Everrett Coombe, DO  clopidogrel (PLAVIX) 75 MG tablet Take 1 tablet (75 mg total) by mouth daily. 01/03/23   Everrett Coombe, DO  DULoxetine (CYMBALTA) 30 MG capsule Take 1 capsule (30 mg total) by mouth daily. 09/13/22   Everrett Coombe, DO  EPINEPHrine 0.3 mg/0.3 mL IJ SOAJ injection Inject 0.3 mg into the muscle as needed for anaphylaxis.    [provider]   furosemide (LASIX) 40 MG tablet Take 80 mg by mouth as needed for edema. 40 mg in the morning and 40mg  night 12/02/20   [provider]  KLOR-CON M20 20 MEQ tablet Take 20 mEq by mouth daily. As needed 12/02/20   [provider]  metoprolol succinate (TOPROL-XL) 50 MG 24 hr tablet Take 1 tablet (50 mg total) by mouth daily. 09/13/22   Everrett Coombe, DO  Multiple Vitamin (MULTIVITAMIN) tablet Take 1 tablet by mouth daily.    [provider]  nitroGLYCERIN (NITROSTAT) 0.4 MG SL tablet Place 1 tablet (0.4 mg total) under the tongue every 5 (five) minutes as needed for chest pain. 03/16/23   Everrett Coombe, DO  omeprazole (PRILOSEC) 40 MG capsule Take 40 mg by mouth daily. 06/25/21   [provider]  ondansetron (ZOFRAN ODT) 4 MG disintegrating tablet Take 1 tablet (4 mg total) by mouth every 8 (eight) hours as needed for nausea or vomiting. Patient taking differently: Take 4 mg by mouth as needed for nausea or vomiting. 12/04/20   Everrett Coombe, DO  potassium chloride (KLOR-CON) 20 MEQ packet Take 20 mEq by mouth as needed.    [provider]  ranolazine (RANEXA) 500 MG 12 hr tablet Take 500 mg by mouth 2 (two) times daily.    [provider]  SYMBICORT 160-4.5 MCG/ACT inhaler INHALE 2 PUFFS INTO THE LUNGS IN THE MORNING AND AT BEDTIME 03/15/22   Everrett Coombe, DO  tadalafil (CIALIS) 20 MG tablet Take 20 mg by mouth as needed for erectile dysfunction. 03/07/22   [provider]  tamsulosin (FLOMAX) 0.4 MG CAPS capsule Take 0.4 mg by mouth daily. 02/14/23   [provider]    Family History Family History  Problem Relation Age of Onset   Cancer Mother    Diabetes Father    Heart Problems Father     Social History Social History   Tobacco Use   Smoking status: Former    Current packs/day: 0.00    Average packs/day: 1 pack/day for 22.8 years (22.8 ttl pk-yrs)    Types: Cigarettes    Start date: 02/21/1998    Quit date:  11/24/2020    Years since quitting: 2.3    Passive exposure: Never   Smokeless tobacco: Never  Vaping Use   Vaping status: Never Used  Substance Use Topics   Alcohol use: Not Currently   Drug use: Never     Allergies   Bee venom, Iodinated contrast media, Hydrochlorothiazide, and Lisinopril   Review of Systems Review of Systems  Constitutional:  Positive for activity change. Negative for appetite change, fatigue and fever.  HENT:  Positive for congestion, postnasal drip and sore throat. Negative for sinus pressure and sneezing.   Respiratory:  Positive for cough, shortness of breath and wheezing.   Cardiovascular:  Negative for chest pain.  Gastrointestinal:  Positive for nausea. Negative for abdominal pain, diarrhea and vomiting.  Musculoskeletal:  Positive for arthralgias and myalgias.  Neurological:  Positive for  headaches. Negative for dizziness and light-headedness.     Physical Exam Triage Vital Signs ED Triage Vitals  Encounter Vitals Group     BP 04/17/23 0807 132/87     Systolic BP Percentile --      Diastolic BP Percentile --      Pulse Rate 04/17/23 0807 71     Resp 04/17/23 0807 16     Temp 04/17/23 0807 98.1 F (36.7 C)     Temp src --      SpO2 04/17/23 0807 97 %     Weight --      Height --      Head Circumference --      Peak Flow --      Pain Score 04/17/23 0811 0     Pain Loc --      Pain Education --      Exclude from Growth Chart --    No data found.  Updated Vital Signs BP 132/87   Pulse 71   Temp 98.1 F (36.7 C)   Resp 16   SpO2 97%   Visual Acuity Right Eye Distance:   Left Eye Distance:   Bilateral Distance:    Right Eye Near:   Left Eye Near:    Bilateral Near:     Physical Exam Vitals reviewed.  Constitutional:      General: He is awake.     Appearance: Normal appearance. He is well-developed. He is not ill-appearing.     Comments: Very pleasant male appears stated age in no acute distress sitting comfortably in exam  room  HENT:     Head: Normocephalic and atraumatic.     Right Ear: Tympanic membrane, ear canal and external ear normal. Tympanic membrane is not erythematous or bulging.     Left Ear: Tympanic membrane, ear canal and external ear normal. Tympanic membrane is not erythematous or bulging.     Nose: Nose normal.     Mouth/Throat:     Pharynx: Uvula midline. Posterior oropharyngeal erythema present. No oropharyngeal exudate or uvula swelling.  Cardiovascular:     Rate and Rhythm: Normal rate and regular rhythm.     Heart sounds: Normal heart sounds, S1 normal and S2 normal. No murmur heard. Pulmonary:     Effort: Pulmonary effort is normal. No accessory muscle usage or respiratory distress.     Breath sounds: Normal breath sounds. No stridor. No wheezing, rhonchi or rales.     Comments: Clear to auscultation bilaterally Neurological:     Mental Status: He is alert.  Psychiatric:        Behavior: Behavior is cooperative.      UC Treatments / Results  Labs (all labs ordered are listed, but only abnormal results are displayed) Labs Reviewed  POC SARS CORONAVIRUS 2 AG -  ED  POCT INFLUENZA A/B    EKG   Radiology DG Chest 2 View Result Date: 04/17/2023 CLINICAL DATA:  Worsening cough.  History of COPD. EXAM: CHEST - 2 VIEW COMPARISON:  04/03/2023 FINDINGS: Previous median sternotomy and CABG procedure. Stable cardiomediastinal contours. Unchanged asymmetric elevation of left hemidiaphragm. No signs of pleural effusion, interstitial edema or airspace disease. IMPRESSION: 1. No acute cardiopulmonary disease. 2. Previous CABG procedure. Electronically Signed   By: Signa Kell M.D.   On: 04/17/2023 08:59    Procedures Procedures (including critical care time)  Medications Ordered in UC Medications - No data to display  Initial Impression / Assessment and Plan / UC Course  I have reviewed the triage vital signs and the nursing notes.  Pertinent labs & imaging results that were  available during my care of the patient were reviewed by me and considered in my medical decision making (see chart for details).     Patient is well-appearing, afebrile, nontoxic, nontachycardic.  He tested negative for COVID and flu in clinic.  Chest x-ray was obtained that showed no acute cardiopulmonary disease.  Given his worsening cough with increased sputum production concern for COPD exacerbation likely triggered by viral illness.  Will cover with cefpodoxime.  No indication for dose adjustment based on metabolic panel from 11/14/2022 with creatinine of 1.59 and calculated creatinine clearance of 71 mL/min.  He was encouraged to use over-the-counter medications including Mucinex, Flonase, Tylenol.  Will start prednisone 40 mg.  Discussed that he is not to take NSAIDs with this medication and risk of GI bleeding.  He was given Tessalon for cough.  Recommended he follow-up with his primary care next week.  We discussed that if anything worsens or changes he is to be seen emergently.  Strict return precautions given.  Excuse note provided.  Final Clinical Impressions(s) / UC Diagnoses   Final diagnoses:  Chronic obstructive pulmonary disease with acute exacerbation [facility])     Discharge Instructions      You are negative for flu and COVID.  Your chest x-ray was normal with no evidence of pneumonia.  I am concerned that you have an exacerbation of underlying chronic lung condition.  Start cefpodoxime twice daily for 7 days.  Start prednisone 40 mg for 5 days.  Do not take NSAIDs with this medication including aspirin, ibuprofen/Advil, naproxen/Aleve.  You can use Mucinex, Flonase, Tylenol for symptom relief.  If your symptoms are not improving within a week or if anything worsens and you have worsening cough, shortness of breath, chest pain, nausea, vomiting, weakness you need to be seen immediately.     ED Prescriptions     Medication Sig Dispense Auth. Provider   predniSONE (DELTASONE) 20  MG tablet Take 2 tablets (40 mg total) by mouth daily for 5 days. 10 tablet Anes Rigel K, PA-C   benzonatate (TESSALON) 100 MG capsule Take 1 capsule (100 mg total) by mouth every 8 (eight) hours. 21 capsule Kathrene Sinopoli K, PA-C   cefpodoxime (VANTIN) 200 MG tablet Take 1 tablet (200 mg total) by mouth 2 (two) times daily for 7 days. 14 tablet Leeana Creer, Noberto Retort, PA-C      PDMP not reviewed this encounter.   Jeani Hawking, PA-C 04/17/23 [id number]

## 2023-04-17 NOTE — ED Triage Notes (Signed)
 Sick since Saturday afternoon. Has had diarrhea, nausea, congestion, wheezing, headache, cough. Has been taking tylenol.

## 2023-04-21 ENCOUNTER — Emergency Department (HOSPITAL_BASED_OUTPATIENT_CLINIC_OR_DEPARTMENT_OTHER): Payer: 59

## 2023-04-21 ENCOUNTER — Inpatient Hospital Stay (HOSPITAL_BASED_OUTPATIENT_CLINIC_OR_DEPARTMENT_OTHER)
Admission: EM | Admit: 2023-04-21 | Discharge: 2023-04-25 | DRG: 389 | Disposition: A | Discharge status: Home / Self Care | Payer: 59 | Attending: Internal Medicine | Admitting: Internal Medicine

## 2023-04-21 ENCOUNTER — Other Ambulatory Visit: Payer: Self-pay

## 2023-04-21 ENCOUNTER — Encounter (HOSPITAL_BASED_OUTPATIENT_CLINIC_OR_DEPARTMENT_OTHER): Payer: Self-pay | Admitting: Emergency Medicine

## 2023-04-21 DIAGNOSIS — Z1152 Encounter for screening for COVID-19: Secondary | ICD-10-CM | POA: Diagnosis not present

## 2023-04-21 DIAGNOSIS — R42 Dizziness and giddiness: Secondary | ICD-10-CM | POA: Diagnosis not present

## 2023-04-21 DIAGNOSIS — E876 Hypokalemia: Secondary | ICD-10-CM | POA: Diagnosis not present

## 2023-04-21 DIAGNOSIS — Z888 Allergy status to other drugs, medicaments and biological substances status: Secondary | ICD-10-CM

## 2023-04-21 DIAGNOSIS — Z833 Family history of diabetes mellitus: Secondary | ICD-10-CM | POA: Diagnosis not present

## 2023-04-21 DIAGNOSIS — E860 Dehydration: Secondary | ICD-10-CM | POA: Diagnosis not present

## 2023-04-21 DIAGNOSIS — I2510 Atherosclerotic heart disease of native coronary artery without angina pectoris: Secondary | ICD-10-CM | POA: Diagnosis not present

## 2023-04-21 DIAGNOSIS — Z4682 Encounter for fitting and adjustment of non-vascular catheter: Secondary | ICD-10-CM | POA: Diagnosis not present

## 2023-04-21 DIAGNOSIS — I10 Essential (primary) hypertension: Secondary | ICD-10-CM | POA: Diagnosis not present

## 2023-04-21 DIAGNOSIS — R059 Cough, unspecified: Secondary | ICD-10-CM | POA: Diagnosis not present

## 2023-04-21 DIAGNOSIS — Z91030 Bee allergy status: Secondary | ICD-10-CM | POA: Diagnosis not present

## 2023-04-21 DIAGNOSIS — K219 Gastro-esophageal reflux disease without esophagitis: Secondary | ICD-10-CM | POA: Diagnosis present

## 2023-04-21 DIAGNOSIS — R110 Nausea: Secondary | ICD-10-CM | POA: Diagnosis not present

## 2023-04-21 DIAGNOSIS — R1084 Generalized abdominal pain: Secondary | ICD-10-CM | POA: Diagnosis not present

## 2023-04-21 DIAGNOSIS — I255 Ischemic cardiomyopathy: Secondary | ICD-10-CM | POA: Diagnosis present

## 2023-04-21 DIAGNOSIS — G4733 Obstructive sleep apnea (adult) (pediatric): Secondary | ICD-10-CM

## 2023-04-21 DIAGNOSIS — Z7902 Long term (current) use of antithrombotics/antiplatelets: Secondary | ICD-10-CM

## 2023-04-21 DIAGNOSIS — I129 Hypertensive chronic kidney disease with stage 1 through stage 4 chronic kidney disease, or unspecified chronic kidney disease: Secondary | ICD-10-CM | POA: Diagnosis present

## 2023-04-21 DIAGNOSIS — K567 Ileus, unspecified: Secondary | ICD-10-CM | POA: Diagnosis not present

## 2023-04-21 DIAGNOSIS — Z79899 Other long term (current) drug therapy: Secondary | ICD-10-CM | POA: Diagnosis not present

## 2023-04-21 DIAGNOSIS — A0811 Acute gastroenteropathy due to Norwalk agent: Secondary | ICD-10-CM | POA: Diagnosis present

## 2023-04-21 DIAGNOSIS — R197 Diarrhea, unspecified: Secondary | ICD-10-CM | POA: Diagnosis not present

## 2023-04-21 DIAGNOSIS — R55 Syncope and collapse: Secondary | ICD-10-CM | POA: Diagnosis not present

## 2023-04-21 DIAGNOSIS — R112 Nausea with vomiting, unspecified: Secondary | ICD-10-CM | POA: Diagnosis not present

## 2023-04-21 DIAGNOSIS — Z87891 Personal history of nicotine dependence: Secondary | ICD-10-CM | POA: Diagnosis not present

## 2023-04-21 DIAGNOSIS — Z7951 Long term (current) use of inhaled steroids: Secondary | ICD-10-CM

## 2023-04-21 DIAGNOSIS — K56600 Partial intestinal obstruction, unspecified as to cause: Principal | ICD-10-CM

## 2023-04-21 DIAGNOSIS — R918 Other nonspecific abnormal finding of lung field: Secondary | ICD-10-CM | POA: Diagnosis not present

## 2023-04-21 DIAGNOSIS — N183 Chronic kidney disease, stage 3 unspecified: Secondary | ICD-10-CM | POA: Diagnosis not present

## 2023-04-21 DIAGNOSIS — K56609 Unspecified intestinal obstruction, unspecified as to partial versus complete obstruction: Secondary | ICD-10-CM | POA: Diagnosis present

## 2023-04-21 DIAGNOSIS — Z91041 Radiographic dye allergy status: Secondary | ICD-10-CM | POA: Diagnosis not present

## 2023-04-21 DIAGNOSIS — J449 Chronic obstructive pulmonary disease, unspecified: Secondary | ICD-10-CM | POA: Diagnosis present

## 2023-04-21 DIAGNOSIS — R0989 Other specified symptoms and signs involving the circulatory and respiratory systems: Secondary | ICD-10-CM | POA: Diagnosis not present

## 2023-04-21 DIAGNOSIS — N400 Enlarged prostate without lower urinary tract symptoms: Secondary | ICD-10-CM | POA: Diagnosis not present

## 2023-04-21 DIAGNOSIS — E785 Hyperlipidemia, unspecified: Secondary | ICD-10-CM | POA: Diagnosis not present

## 2023-04-21 DIAGNOSIS — Z951 Presence of aortocoronary bypass graft: Secondary | ICD-10-CM

## 2023-04-21 DIAGNOSIS — N281 Cyst of kidney, acquired: Secondary | ICD-10-CM | POA: Diagnosis not present

## 2023-04-21 DIAGNOSIS — K5730 Diverticulosis of large intestine without perforation or abscess without bleeding: Secondary | ICD-10-CM | POA: Diagnosis not present

## 2023-04-21 DIAGNOSIS — K6389 Other specified diseases of intestine: Secondary | ICD-10-CM | POA: Diagnosis not present

## 2023-04-21 DIAGNOSIS — Z7982 Long term (current) use of aspirin: Secondary | ICD-10-CM

## 2023-04-21 DIAGNOSIS — K529 Noninfective gastroenteritis and colitis, unspecified: Secondary | ICD-10-CM

## 2023-04-21 LAB — RESP PANEL BY RT-PCR (RSV, FLU A&B, COVID)  RVPGX2
Influenza A by PCR: NEGATIVE
Influenza B by PCR: NEGATIVE
Resp Syncytial Virus by PCR: NEGATIVE
SARS Coronavirus 2 by RT PCR: NEGATIVE

## 2023-04-21 LAB — COMPREHENSIVE METABOLIC PANEL
ALT: 28 U/L (ref 0–44)
AST: 24 U/L (ref 15–41)
Albumin: 3.8 g/dL (ref 3.5–5.0)
Alkaline Phosphatase: 94 U/L (ref 38–126)
Anion gap: 14 (ref 5–15)
BUN: 26 mg/dL — ABNORMAL HIGH (ref 8–23)
CO2: 24 mmol/L (ref 22–32)
Calcium: 8.9 mg/dL (ref 8.9–10.3)
Chloride: 100 mmol/L (ref 98–111)
Creatinine, Ser: 1.48 mg/dL — ABNORMAL HIGH (ref 0.61–1.24)
GFR, Estimated: 53 mL/min — ABNORMAL LOW (ref >60)
Glucose, Bld: 185 mg/dL — ABNORMAL HIGH (ref 70–99)
Potassium: 3.4 mmol/L — ABNORMAL LOW (ref 3.5–5.1)
Sodium: 138 mmol/L (ref 135–145)
Total Bilirubin: 1.3 mg/dL — ABNORMAL HIGH (ref 0.0–1.2)
Total Protein: 7.4 g/dL (ref 6.5–8.1)

## 2023-04-21 LAB — CBC WITH DIFFERENTIAL/PLATELET
Abs Immature Granulocytes: 0.06 10*3/uL (ref 0.00–0.07)
Basophils Absolute: 0 10*3/uL (ref 0.0–0.1)
Basophils Relative: 0 %
Eosinophils Absolute: 0 10*3/uL (ref 0.0–0.5)
Eosinophils Relative: 0 %
HCT: 51 % (ref 39.0–52.0)
Hemoglobin: 16.9 g/dL (ref 13.0–17.0)
Immature Granulocytes: 1 %
Lymphocytes Relative: 7 %
Lymphs Abs: 0.7 10*3/uL (ref 0.7–4.0)
MCH: 29.7 pg (ref 26.0–34.0)
MCHC: 33.1 g/dL (ref 30.0–36.0)
MCV: 89.6 fL (ref 80.0–100.0)
Monocytes Absolute: 0.7 10*3/uL (ref 0.1–1.0)
Monocytes Relative: 7 %
Neutro Abs: 8.5 10*3/uL — ABNORMAL HIGH (ref 1.7–7.7)
Neutrophils Relative %: 85 %
Platelets: 223 10*3/uL (ref 150–400)
RBC: 5.69 MIL/uL (ref 4.22–5.81)
RDW: 13.4 % (ref 11.5–15.5)
WBC: 9.9 10*3/uL (ref 4.0–10.5)
nRBC: 0 % (ref 0.0–0.2)

## 2023-04-21 MED ORDER — ACETAMINOPHEN 650 MG RE SUPP: 650.0000 mg | Freq: Four times a day (QID) | RECTAL | Status: DC | PRN

## 2023-04-21 MED ORDER — TRAZODONE HCL 50 MG PO TABS: 25.0000 mg | ORAL_TABLET | Freq: Every evening | ORAL | Status: DC | PRN

## 2023-04-21 MED ORDER — HYDROMORPHONE HCL 1 MG/ML IJ SOLN
0.5000 mg | INTRAMUSCULAR | Status: DC | PRN
  Administered 2023-04-22 – 2023-04-23 (×3): 1 mg via INTRAVENOUS
  Filled 2023-04-21 (×4): qty 1

## 2023-04-21 MED ORDER — FAMOTIDINE IN NACL 20-0.9 MG/50ML-% IV SOLN
20.0000 mg | Freq: Once | INTRAVENOUS | Status: AC
  Administered 2023-04-21: 20 mg via INTRAVENOUS
  Filled 2023-04-21: qty 50

## 2023-04-21 MED ORDER — SODIUM CHLORIDE 0.9 % IV BOLUS
1000.0000 mL | Freq: Once | INTRAVENOUS | Status: AC
  Administered 2023-04-21: 1000 mL via INTRAVENOUS

## 2023-04-21 MED ORDER — ONDANSETRON HCL 4 MG PO TABS: 4.0000 mg | ORAL_TABLET | Freq: Four times a day (QID) | ORAL | Status: DC | PRN

## 2023-04-21 MED ORDER — ONDANSETRON HCL 4 MG/2ML IJ SOLN
4.0000 mg | Freq: Once | INTRAMUSCULAR | Status: AC
  Administered 2023-04-21: 4 mg via INTRAVENOUS
  Filled 2023-04-21: qty 2

## 2023-04-21 MED ORDER — ONDANSETRON HCL 4 MG/2ML IJ SOLN
4.0000 mg | Freq: Four times a day (QID) | INTRAMUSCULAR | Status: DC | PRN
  Administered 2023-04-22 – 2023-04-23 (×2): 4 mg via INTRAVENOUS
  Filled 2023-04-21 (×2): qty 2

## 2023-04-21 MED ORDER — PANTOPRAZOLE SODIUM 40 MG IV SOLR
40.0000 mg | Freq: Every day | INTRAVENOUS | Status: DC
  Administered 2023-04-21 – 2023-04-23 (×3): 40 mg via INTRAVENOUS
  Filled 2023-04-21 (×3): qty 10

## 2023-04-21 MED ORDER — SODIUM CHLORIDE 0.9 % IV SOLN: INTRAVENOUS | Status: AC

## 2023-04-21 MED ORDER — ACETAMINOPHEN 325 MG PO TABS: 650.0000 mg | ORAL_TABLET | Freq: Four times a day (QID) | ORAL | Status: DC | PRN

## 2023-04-21 MED ORDER — ALBUTEROL SULFATE (2.5 MG/3ML) 0.083% IN NEBU: 2.5000 mg | INHALATION_SOLUTION | RESPIRATORY_TRACT | Status: DC | PRN

## 2023-04-21 MED ORDER — MORPHINE SULFATE (PF) 4 MG/ML IV SOLN
4.0000 mg | Freq: Once | INTRAVENOUS | Status: AC
  Administered 2023-04-21: 4 mg via INTRAVENOUS
  Filled 2023-04-21: qty 1

## 2023-04-21 MED ORDER — RANOLAZINE ER 500 MG PO TB12
500.0000 mg | ORAL_TABLET | Freq: Two times a day (BID) | ORAL | Status: DC
  Administered 2023-04-21: 500 mg via ORAL
  Filled 2023-04-21 (×2): qty 1

## 2023-04-21 MED ORDER — DULOXETINE HCL 30 MG PO CPEP
30.0000 mg | ORAL_CAPSULE | Freq: Every day | ORAL | Status: DC
  Administered 2023-04-21: 30 mg via ORAL
  Filled 2023-04-21 (×2): qty 1

## 2023-04-21 MED ORDER — DICYCLOMINE HCL 10 MG/ML IM SOLN
20.0000 mg | Freq: Once | INTRAMUSCULAR | Status: AC
  Administered 2023-04-21: 20 mg via INTRAMUSCULAR
  Filled 2023-04-21: qty 2

## 2023-04-21 MED ORDER — CLOPIDOGREL BISULFATE 75 MG PO TABS
75.0000 mg | ORAL_TABLET | Freq: Every day | ORAL | Status: DC
  Administered 2023-04-21: 75 mg via ORAL
  Filled 2023-04-21 (×2): qty 1

## 2023-04-21 MED ORDER — ASPIRIN 81 MG PO CHEW
81.0000 mg | CHEWABLE_TABLET | Freq: Every day | ORAL | Status: DC
Start: 2023-04-21 — End: 2023-04-22
  Administered 2023-04-21: 81 mg via ORAL
  Filled 2023-04-21 (×2): qty 1

## 2023-04-21 MED ORDER — CEFUROXIME AXETIL 500 MG PO TABS
500.0000 mg | ORAL_TABLET | Freq: Two times a day (BID) | ORAL | Status: DC
  Filled 2023-04-21: qty 1

## 2023-04-21 MED ORDER — ENOXAPARIN SODIUM 40 MG/0.4ML IJ SOSY
40.0000 mg | PREFILLED_SYRINGE | INTRAMUSCULAR | Status: DC
Start: 2023-04-21 — End: 2023-04-25
  Administered 2023-04-21 – 2023-04-24 (×4): 40 mg via SUBCUTANEOUS
  Filled 2023-04-21 (×4): qty 0.4

## 2023-04-21 MED ORDER — TAMSULOSIN HCL 0.4 MG PO CAPS
0.4000 mg | ORAL_CAPSULE | Freq: Every day | ORAL | Status: DC
  Filled 2023-04-21 (×2): qty 1

## 2023-04-21 MED ORDER — ATORVASTATIN CALCIUM 40 MG PO TABS
80.0000 mg | ORAL_TABLET | Freq: Every day | ORAL | Status: DC
  Administered 2023-04-21: 80 mg via ORAL
  Filled 2023-04-21 (×2): qty 2

## 2023-04-21 MED ORDER — METOPROLOL SUCCINATE ER 50 MG PO TB24
50.0000 mg | ORAL_TABLET | Freq: Every day | ORAL | Status: DC
  Administered 2023-04-21: 50 mg via ORAL
  Filled 2023-04-21 (×2): qty 1

## 2023-04-21 MED ORDER — KETOROLAC TROMETHAMINE 30 MG/ML IJ SOLN
15.0000 mg | Freq: Once | INTRAMUSCULAR | Status: AC
  Administered 2023-04-21: 15 mg via INTRAVENOUS
  Filled 2023-04-21: qty 1

## 2023-04-21 NOTE — Consult Note (Signed)
 [employer] [birth year]  [id number].    Requesting MD: Dr. Teena Dunk Chief Complaint/Reason for Consult: psbo  HPI:  This is a 63 yo male with a history of COPD, OSA, HLD, ischemic cardiomyopathy, CSBG x3, CAD (on plavix, LD ), etoh use, GERD, HTN, and chronic kidney disease who presents to [facility] ED last night with N/V/D.  This start yesterday.  He has no known sick contacts but he works for [employer].  He tried some zofran at home but still required coming in as this did not subside his symptoms.  He was recently treated for a URI and COPD exacerbation 1 week ago with Cefpodoxime and prednisone.    He currently denies any fevers, but felt warm, chest pain, SOB, dysuria, etc.  In the ED he was worked and found to have a CT scan with a distended stomach and some small bowel with maybe a mild transition point c/w ileus or psbo.  His labs were relatively unremarkable.  He is being admitted to [facility], and we have been asked to see him.  ROS: ROS: see HPI  Family History  Problem Relation Age of Onset   Cancer Mother    Diabetes Father    Heart Problems Father     Past Medical History:  Diagnosis Date   CHF (congestive heart failure) (HCC)     Past Surgical History:  Procedure Laterality Date   CARDIAC SURGERY     CHOLECYSTECTOMY, LAPAROSCOPIC     VASECTOMY      Social History:  reports that he quit smoking about 2 years ago. His smoking use included cigarettes. He started smoking about 25 years ago. He has a 22.8 pack-year smoking history. He has never been exposed to tobacco smoke. He has never used smokeless tobacco. He reports that he does not currently use alcohol. He reports that he does not use drugs.  Allergies:  Allergies  Allergen Reactions   Bee Venom Anaphylaxis   Iodinated Contrast Media Anaphylaxis    Rash and Shortness of Breath   Hydrochlorothiazide Other (See Comments)    Dehydration and muscle spasms. Lasix does the same   Lisinopril Other (See  Comments) and Cough    Losartan is OK    (Not in a hospital admission)    Physical Exam: Blood pressure (!) 144/109, pulse 87, temperature 98.1 F (36.7 C), resp. rate 20, height 6\' 1"  (1.854 m), weight 101 kg, SpO2 95%.  General: pleasant, WD, WN white male who is laying in bed in NAD HEENT: head is normocephalic, atraumatic.  Sclera are noninjected.  PERRL.  Ears and nose without any masses or lesions.  Mouth is pink.  NGT present with bilious output Heart: regular, rate, and rhythm.  Normal s1,s2. No obvious murmurs, gallops, or rubs noted.  Palpable radial and pedal pulses bilaterally Lungs: CTAB, no wheezes, rhonchi, or rales noted.  Respiratory effort nonlabored Abd: soft, NT, ND, +BS, no masses, hernias, or organomegaly MS: all 4 extremities are symmetrical with no cyanosis, clubbing, or edema. Skin: warm and dry with no masses, lesions, or rashes Neuro: Cranial nerves 2-12 grossly intact, sensation is normal throughout Psych: A&Ox3 with an appropriate affect.   Results for orders placed or performed during the hospital encounter of 04/21/23 (from the past 48 hours)  Resp panel by RT-PCR (RSV, Flu A&B, Covid) Anterior Nasal Swab     Status: None   Collection Time: 04/21/23  6:01 AM   Specimen: Anterior Nasal Swab  Result Value  Ref Range   SARS Coronavirus 2 by RT PCR NEGATIVE NEGATIVE    Comment: (NOTE) SARS-CoV-2 target nucleic acids are NOT DETECTED.  The SARS-CoV-2 RNA is generally detectable in upper respiratory specimens during the acute phase of infection. The lowest concentration of SARS-CoV-2 viral copies this assay can detect is 138 copies/mL. A negative result does not preclude SARS-Cov-2 infection and should not be used as the sole basis for treatment or other patient management decisions. A negative result may occur with  improper specimen collection/handling, submission of specimen other than nasopharyngeal swab, presence of viral mutation(s) within  the areas targeted by this assay, and inadequate number of viral copies(<138 copies/mL). A negative result must be combined with clinical observations, patient history, and epidemiological information. The expected result is Negative.  Fact Sheet for Patients:  BloggerCourse.com  Fact Sheet for Healthcare Providers:  SeriousBroker.it  This test is no t yet approved or cleared by the [place] FDA and  has been authorized for detection and/or diagnosis of SARS-CoV-2 by FDA under an Emergency Use Authorization (EUA). This EUA will remain  in effect (meaning this test can be used) for the duration of the COVID-19 declaration under Section 564(b)(1) of the Act, 21 U.S.C.section 360bbb-3(b)(1), unless the authorization is terminated  or revoked sooner.       Influenza A by PCR NEGATIVE NEGATIVE   Influenza B by PCR NEGATIVE NEGATIVE    Comment: (NOTE) The Xpert Xpress SARS-CoV-2/FLU/RSV plus assay is intended as an aid in the diagnosis of influenza from Nasopharyngeal swab specimens and should not be used as a sole basis for treatment. Nasal washings and aspirates are unacceptable for Xpert Xpress SARS-CoV-2/FLU/RSV testing.  Fact Sheet for Patients: BloggerCourse.com  Fact Sheet for Healthcare Providers: SeriousBroker.it  This test is not yet approved or cleared by the [place] FDA and has been authorized for detection and/or diagnosis of SARS-CoV-2 by FDA under an Emergency Use Authorization (EUA). This EUA will remain in effect (meaning this test can be used) for the duration of the COVID-19 declaration under Section 564(b)(1) of the Act, 21 U.S.C. section 360bbb-3(b)(1), unless the authorization is terminated or revoked.     Resp Syncytial Virus by PCR NEGATIVE NEGATIVE    Comment: (NOTE) Fact Sheet for Patients: BloggerCourse.com  Fact  Sheet for Healthcare Providers: SeriousBroker.it  This test is not yet approved or cleared by the [place] FDA and has been authorized for detection and/or diagnosis of SARS-CoV-2 by FDA under an Emergency Use Authorization (EUA). This EUA will remain in effect (meaning this test can be used) for the duration of the COVID-19 declaration under Section 564(b)(1) of the Act, 21 U.S.C. section 360bbb-3(b)(1), unless the authorization is terminated or revoked.  Performed at [facility], [address] [place], [place] [address]   CBC with Differential     Status: Abnormal   Collection Time: 04/21/23  6:01 AM  Result Value Ref Range   WBC 9.9 4.0 - 10.5 K/uL   RBC 5.69 4.22 - 5.81 MIL/uL   Hemoglobin 16.9 13.0 - 17.0 g/dL   HCT 60.4 54.0 - 98.1 %   MCV 89.6 80.0 - 100.0 fL   MCH 29.7 26.0 - 34.0 pg   MCHC 33.1 30.0 - 36.0 g/dL   RDW 19.1 47.8 - 29.5 %   Platelets 223 150 - 400 K/uL   nRBC 0.0 0.0 - 0.2 %   Neutrophils Relative % 85 %   Neutro Abs 8.5 (H) 1.7 -  7.7 K/uL   Lymphocytes Relative 7 %   Lymphs Abs 0.7 0.7 - 4.0 K/uL   Monocytes Relative 7 %   Monocytes Absolute 0.7 0.1 - 1.0 K/uL   Eosinophils Relative 0 %   Eosinophils Absolute 0.0 0.0 - 0.5 K/uL   Basophils Relative 0 %   Basophils Absolute 0.0 0.0 - 0.1 K/uL   Immature Granulocytes 1 %   Abs Immature Granulocytes 0.06 0.00 - 0.07 K/uL    Comment: Performed at [facility], [address] [place], [place] [address]  Comprehensive metabolic panel     Status: Abnormal   Collection Time: 04/21/23  6:01 AM  Result Value Ref Range   Sodium 138 135 - 145 mmol/L   Potassium 3.4 (L) 3.5 - 5.1 mmol/L   Chloride 100 98 - 111 mmol/L   CO2 24 22 - 32 mmol/L   Glucose, Bld 185 (H) 70 - 99 mg/dL    Comment: Glucose reference range applies only to samples taken after fasting for at least 8 hours.   BUN 26 (H) 8 - 23 mg/dL   Creatinine, Ser 6.04 (H) 0.61 - 1.24 mg/dL    Calcium 8.9 8.9 - 54.0 mg/dL   Total Protein 7.4 6.5 - 8.1 g/dL   Albumin 3.8 3.5 - 5.0 g/dL   AST 24 15 - 41 U/L   ALT 28 0 - 44 U/L   Alkaline Phosphatase 94 38 - 126 U/L   Total Bilirubin 1.3 (H) 0.0 - 1.2 mg/dL   GFR, Estimated 53 (L) >60 mL/min    Comment: (NOTE) Calculated using the CKD-EPI Creatinine Equation (2021)    Anion gap 14 5 - 15    Comment: Performed at [facility], [address] [place], [place] [address]   CT ABDOMEN PELVIS WO CONTRAST Result Date: 04/21/2023 CLINICAL DATA:  Abdominal pain, acute, nonlocalized EXAM: CT ABDOMEN AND PELVIS WITHOUT CONTRAST TECHNIQUE: Multidetector CT imaging of the abdomen and pelvis was performed following the standard protocol without IV contrast. RADIATION DOSE REDUCTION: This exam was performed according to the departmental dose-optimization program which includes automated exposure control, adjustment of the mA and/or kV according to patient size and/or use of iterative reconstruction technique. COMPARISON:  MR 04/09/2022 FINDINGS: Lower chest: No pleural or pericardial effusion. Coronary calcifications. Subsegmental scarring or atelectasis in the lung bases. Hepatobiliary: No focal liver abnormality is seen. Status post cholecystectomy. No biliary dilatation. Pancreas: Unremarkable. No pancreatic ductal dilatation or surrounding inflammatory changes. Spleen: Normal in size without focal abnormality. Adrenals/Urinary Tract: No adrenal mass. Peripheral left renal calcifications largest 10 mm mid pole. No hydronephrosis or ureterectasis. Stable small bilateral renal cysts. Urinary bladder physiologically distended. Stomach/Bowel: Stomach is markedly distended. Duodenum decompressed. Multiple dilated mid small bowel loops, without evident wall thickening or pneumatosis. Gradual transition to decompressed distal small bowel and terminal ileum. Normal appendix. The colon is incompletely distended, with scattered diverticula in ascending,  transverse, and descending segments without adjacent inflammatory change. Vascular/Lymphatic: Mild calcified aortoiliac plaque without aneurysm. No abdominal or pelvic adenopathy. Reproductive: Prostate enlargement with central coarse calcifications. Other: No ascites.  No free air. Musculoskeletal: Sternotomy wires. IMPRESSION: 1. Markedly distended stomach and mid small bowel loops, with gradual transition to decompressed distal small bowel and terminal ileum. Findings may reflect ileus or partial small bowel obstruction. 2. Colonic diverticulosis. 3. Nonobstructive left nephrolithiasis. 4.  Aortic Atherosclerosis (ICD10-I70.0). Electronically Signed   By: Corlis Leak M.D.   On: 04/21/2023 11:04  DG Chest Portable 1 View Result Date: 04/21/2023 CLINICAL DATA:  Cough EXAM: PORTABLE CHEST 1 VIEW COMPARISON:  Chest radiograph dated 04/17/2023 FINDINGS: Low lung volumes with bronchovascular crowding. Bibasilar patchy and linear opacities. No pleural effusion or pneumothorax. Similar enlarged cardiomediastinal silhouette. Median sternotomy wires are nondisplaced. IMPRESSION: Low lung volumes with bronchovascular crowding. Bibasilar patchy and linear opacities, likely atelectasis. Electronically Signed   By: Agustin Cree M.D.   On: 04/21/2023 08:24      Assessment/Plan N/V/D, abdominal pain The patient has been seen, examined, labs, vitals, imaging, and chart all personally reviewed.  The patient had an acute onset of N/V/D yesterday.  He has no known sick contacts but does work in [employer], [employer].  He also was on an abx about a week ago.  Agree with checking a c diff.  He has had a lap chole and could have a psbo secondary to adhesive disease, but generally this doesn't usually produce much scar tissue, but still possible.  Patient has had an NGT placed.  No urgent needs for surgical intervention.  If he continues to have bowel function, he may not need the SBO protocol; however, if his bowel function has ceased,  then we could consider the protocol after being adequately decompressed with his NGT.  We will follow along.   FEN - NPO/NGT/IVFs per primary  VTE - ok from surgical standpoint ID - none currently needed surgically Admit - hospitalist service  I reviewed ED provider notes, last 24 h vitals and pain scores, last 48 h intake and output, last 24 h labs and trends, and last 24 h imaging results.  Barnetta Chapel, [facility] 04/21/2023, 2:40 PM Please see Amion for pager number during day hours 7:00am-4:30pm or 7:00am -11:30am on weekends

## 2023-04-21 NOTE — ED Notes (Signed)
 Patient transported to CT

## 2023-04-21 NOTE — ED Triage Notes (Signed)
 Per wife, pt sick X 1 week, got Rx meds, yesterday started Diarrhea unable to get under.

## 2023-04-21 NOTE — ED Notes (Signed)
 Called [facility] for transport to [facility] @14 :46.  Spoke with [employer]

## 2023-04-21 NOTE — ED Provider Notes (Addendum)
 Patient initially seen by Dr. Daun Peacock.  Please see her note.  Patient presented with nausea vomiting diarrhea.  Patient now complaining of increasing abdominal pain.  Will give additional pain medicines.  CT scan has been ordered.  Initial laboratory testing has been unremarkable.  Patient has not been able to provide a stool sample  Patient CT scan shows markedly distended stomach and mid small bowel loops with gradual transition to decompressed small bowel and terminal ileum.  Findings concerning for the possibility of partial small bowel obstruction versus ileus.  Will place NG tube, and arrange for admission  Case discussed with Dr Erenest Blank.   I will consult with general surgery Case discussed with Marisue Ivan, [place] general surgery service.  They will see patient when he arrives at the hospital   Linwood Dibbles, MD 04/21/23 1309    Linwood Dibbles, MD 04/21/23 [phone]

## 2023-04-21 NOTE — ED Notes (Signed)
 Portable xray completed for Verification of NG tube placement

## 2023-04-21 NOTE — ED Triage Notes (Signed)
 Per EMS Called for SOB was found in tub covered in faeces, Dx URI yesterday given ABX and steroids.

## 2023-04-21 NOTE — ED Provider Notes (Signed)
 [place] [facility] Provider Note   CSN: [id number] Arrival date & time: 04/21/23  0534     History  Chief Complaint  Patient presents with   Diarrhea    Paul Hardy is a 63 y.o. male.  The history is provided by the patient and the spouse.  Diarrhea Quality:  Watery Onset quality:  Sudden Duration:  1 week Timing:  Intermittent Progression:  Unchanged Relieved by:  Nothing Worsened by:  Nothing Ineffective treatments:  None tried Associated symptoms: cough and vomiting   Associated symptoms comment:  Feeling feverish  Patient with a history of OSA and COPD with URI with congestion, sore throat and cough for 1 week seen at urgent care on 04/17/23 and started on cefpodoxime and prednisone at that visit for a COPD exacerbation.  Tonight developed nausea, vomiting and diarrhea, at least 5 episodes.  Patient has felt feverish but has not had a formal temperature taken.  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  No sick family members.  Patient and wife report patient had attempted Zofran ODT at home without relief.    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 Authorizing Provider  albuterol (VENTOLIN HFA) 108 (90 Base) MCG/ACT inhaler INHALE 2 PUFFS INTO THE LUNGS EVERY 6 HOURS AS NEEDED FOR WHEEZING Patient taking differently: Inhale 2 puffs into the lungs every 6 (six) hours as needed for wheezing or shortness of breath. 03/17/22   Glenford Bayley, NP  AMBULATORY NON FORMULARY MEDICATION Please provide rollator walker.  Diagnosis: Gait instability, CABG R26.81, Z95.1 Patient taking differently: 1 each by Other route See admin instructions. Please provide rollator walker.  Diagnosis: Gait instability, CABG R26.81, Z95.1 12/04/20   Everrett Coombe, DO  aspirin 81 MG chewable tablet Chew 81 mg by mouth daily.    [provider]  atorvastatin (LIPITOR) 80 MG tablet Take 1 tablet (80 mg total) by mouth at bedtime. 09/13/22   Everrett Coombe, DO  benzonatate (TESSALON) 100 MG capsule Take 1 capsule (100 mg total) by mouth every 8 (eight) hours. 04/17/23   Raspet, Noberto Retort, PA-C  Budeson-Glycopyrrol-Formoterol (BREZTRI AEROSPHERE) 160-9-4.8 MCG/ACT AERO Inhale 2 puffs into the lungs 2 (two) times daily. 12/07/22   Everrett Coombe, DO  cefpodoxime (VANTIN) 200 MG tablet Take 1 tablet (200 mg total) by mouth 2 (two) times daily for 7 days. 04/17/23 04/24/23  Raspet, Noberto Retort, PA-C  clopidogrel (PLAVIX) 75 MG tablet Take 1 tablet (75 mg total) by mouth daily. 01/03/23   Everrett Coombe, DO  DULoxetine (CYMBALTA) 30 MG capsule Take 1 capsule (30 mg total) by mouth daily.  09/13/22   Everrett Coombe, DO  EPINEPHrine 0.3 mg/0.3 mL IJ SOAJ injection Inject 0.3 mg into the muscle as needed for anaphylaxis.    [provider]  furosemide (LASIX) 40 MG tablet Take 80 mg by mouth as needed for edema. 40 mg in the morning and 40mg  night 12/02/20   [provider]  KLOR-CON M20 20 MEQ tablet Take 20 mEq by mouth daily. As needed 12/02/20   [provider]  metoprolol succinate (TOPROL-XL) 50 MG 24 hr tablet Take 1 tablet (50 mg total) by mouth daily. 09/13/22   Everrett Coombe, DO  Multiple Vitamin (MULTIVITAMIN) tablet Take 1 tablet by mouth daily.    [provider]  nitroGLYCERIN (NITROSTAT) 0.4 MG SL tablet Place 1 tablet (0.4 mg total) under the tongue every 5 (five) minutes as needed for chest pain. 03/16/23   Everrett Coombe, DO  omeprazole (PRILOSEC) 40 MG capsule Take 40 mg by mouth daily. 06/25/21   [provider]  ondansetron (ZOFRAN ODT) 4 MG disintegrating tablet Take 1 tablet (4 mg total) by mouth every 8 (eight) hours as needed for nausea or vomiting. Patient taking differently: Take 4 mg by mouth as needed for nausea or vomiting. 12/04/20   Everrett Coombe, DO  potassium chloride (KLOR-CON) 20 MEQ packet Take 20 mEq by mouth as needed.    [provider]  predniSONE (DELTASONE) 20 MG tablet Take 2 tablets (40 mg total) by mouth daily for 5 days. 04/17/23 04/22/23  Raspet, Noberto Retort, PA-C  ranolazine (RANEXA) 500 MG 12 hr tablet Take 500 mg by mouth 2 (two) times daily.    [provider]  SYMBICORT 160-4.5 MCG/ACT inhaler INHALE 2 PUFFS INTO THE LUNGS IN THE MORNING AND AT BEDTIME 03/15/22   Everrett Coombe, DO  tadalafil (CIALIS) 20 MG tablet Take 20 mg by mouth as needed for erectile dysfunction. 03/07/22   [provider]  tamsulosin (FLOMAX) 0.4 MG CAPS capsule Take 0.4 mg by mouth daily. 02/14/23   [provider]      Allergies    Bee venom, Iodinated contrast media, Hydrochlorothiazide, and Lisinopril    Review of Systems   Review of Systems  HENT:  Positive for congestion.   Respiratory:  Positive for cough.   Gastrointestinal:  Positive for diarrhea, nausea and vomiting.  All other systems reviewed and are negative.   Physical Exam Updated Vital Signs BP (!) 139/103 (BP Location: Left Arm)   Pulse (!) 103   Temp (!) 97.4 F (36.3 C) (Oral)   Resp 20   Ht 6'  1" (1.854 m)   Wt 101 kg   SpO2 95%   BMI 29.38 kg/m  Physical Exam Vitals and nursing note reviewed. Exam conducted with a chaperone present.  Constitutional:      General: He is not in acute distress.    Appearance: Normal appearance. He is well-developed. He is not diaphoretic.  HENT:     Head: Normocephalic and atraumatic.     Nose: Nose normal.  Eyes:     Conjunctiva/sclera: Conjunctivae normal.     Pupils: Pupils are equal, round, and reactive to light.  Cardiovascular:     Rate and Rhythm: Normal rate and regular rhythm.     Pulses: Normal pulses.     Heart sounds: Normal heart sounds.  Pulmonary:     Effort: Pulmonary effort is normal. No respiratory distress.     Breath sounds: Normal breath sounds. No wheezing or rales.  Abdominal:  General: Bowel sounds are normal.     Palpations: Abdomen is soft.     Tenderness: There is no abdominal tenderness. There is no guarding or rebound.  Musculoskeletal:        General: Normal range of motion.     Cervical back: Normal range of motion and neck supple.  Skin:    General: Skin is warm and dry.     Capillary Refill: Capillary refill takes less than 2 seconds.  Neurological:     General: No focal deficit present.     Mental Status: He is alert and oriented to person, place, and time.     Deep Tendon Reflexes: Reflexes normal.  Psychiatric:        Thought Content: Thought content normal.     ED Results / Procedures / Treatments   Labs (all labs ordered are listed, but only abnormal results are displayed) Results for orders placed or performed during the hospital encounter of 04/21/23  Resp panel by RT-PCR (RSV, Flu A&B, Covid) Anterior Nasal Swab   Collection Time: 04/21/23  6:01 AM   Specimen: Anterior Nasal Swab  Result Value Ref Range   SARS Coronavirus 2 by RT PCR NEGATIVE NEGATIVE   Influenza A by PCR NEGATIVE NEGATIVE   Influenza B by PCR NEGATIVE NEGATIVE   Resp Syncytial Virus by PCR NEGATIVE NEGATIVE   CBC with Differential   Collection Time: 04/21/23  6:01 AM  Result Value Ref Range   WBC 9.9 4.0 - 10.5 K/uL   RBC 5.69 4.22 - 5.81 MIL/uL   Hemoglobin 16.9 13.0 - 17.0 g/dL   HCT 16.1 09.6 - 04.5 %   MCV 89.6 80.0 - 100.0 fL   MCH 29.7 26.0 - 34.0 pg   MCHC 33.1 30.0 - 36.0 g/dL   RDW 40.9 81.1 - 91.4 %   Platelets 223 150 - 400 K/uL   nRBC 0.0 0.0 - 0.2 %   Neutrophils Relative % 85 %   Neutro Abs 8.5 (H) 1.7 - 7.7 K/uL   Lymphocytes Relative 7 %   Lymphs Abs 0.7 0.7 - 4.0 K/uL   Monocytes Relative 7 %   Monocytes Absolute 0.7 0.1 - 1.0 K/uL   Eosinophils Relative 0 %   Eosinophils Absolute 0.0 0.0 - 0.5 K/uL   Basophils Relative 0 %   Basophils Absolute 0.0 0.0 - 0.1 K/uL   Immature Granulocytes 1 %   Abs Immature Granulocytes 0.06 0.00 - 0.07 K/uL  Comprehensive metabolic panel   Collection Time: 04/21/23  6:01 AM  Result Value Ref Range   Sodium 138 135 - 145 mmol/L   Potassium 3.4 (L) 3.5 - 5.1 mmol/L   Chloride 100 98 - 111 mmol/L   CO2 24 22 - 32 mmol/L   Glucose, Bld 185 (H) 70 - 99 mg/dL   BUN 26 (H) 8 - 23 mg/dL   Creatinine, Ser 7.82 (H) 0.61 - 1.24 mg/dL   Calcium 8.9 8.9 - 95.6 mg/dL   Total Protein 7.4 6.5 - 8.1 g/dL   Albumin 3.8 3.5 - 5.0 g/dL   AST 24 15 - 41 U/L   ALT 28 0 - 44 U/L   Alkaline Phosphatase 94 38 - 126 U/L   Total Bilirubin 1.3 (H) 0.0 - 1.2 mg/dL   GFR, Estimated 53 (L) >60 mL/min   Anion gap 14 5 - 15   DG Chest 2 View Result Date: 04/17/2023 CLINICAL DATA:  Worsening cough.  History of COPD. EXAM: CHEST - 2  VIEW COMPARISON:  04/03/2023 FINDINGS: Previous median sternotomy and CABG procedure. Stable cardiomediastinal contours. Unchanged asymmetric elevation of left hemidiaphragm. No signs of pleural effusion, interstitial edema or airspace disease. IMPRESSION: 1. No acute cardiopulmonary disease. 2. Previous CABG procedure. Electronically Signed   By: Signa Kell M.D.   On: 04/17/2023 08:59   CT CHEST LUNG CA SCREEN LOW DOSE W/O  CM Result Date: 04/16/2023 CLINICAL DATA:  63 year old male former smoker with 40 pack-year smoking history, quit smoking 2021 EXAM: CT CHEST WITHOUT CONTRAST LOW-DOSE FOR LUNG CANCER SCREENING TECHNIQUE: Multidetector CT imaging of the chest was performed following the standard protocol without IV contrast. RADIATION DOSE REDUCTION: This exam was performed according to the departmental dose-optimization program which includes automated exposure control, adjustment of the mA and/or kV according to patient size and/or use of iterative reconstruction technique. COMPARISON:  03/30/2022 screening chest CT FINDINGS: Cardiovascular: Normal heart size. No significant pericardial effusion/thickening. Three-vessel coronary atherosclerosis status post CABG. Atherosclerotic nonaneurysmal thoracic aorta. Normal caliber pulmonary arteries. Mediastinum/Nodes: No significant thyroid nodules. Unremarkable esophagus. No pathologically enlarged axillary, mediastinal or hilar lymph nodes, noting limited sensitivity for the detection of hilar adenopathy on this noncontrast study. Lungs/Pleura: No pneumothorax. No pleural effusion. Mild centrilobular emphysema with diffuse bronchial wall thickening. No acute consolidative airspace disease or lung masses. No significant growth of previously visualized pulmonary nodules up to 4.6 mm in the right middle lobe along the minor fissure on series 3/image 164. No new significant pulmonary nodules. Upper abdomen: Cholecystectomy. Musculoskeletal: No aggressive appearing focal osseous lesions. Moderate thoracic spondylosis. Intact sternotomy wires. IMPRESSION: 1. Lung-RADS 2, benign appearance or behavior. Continue annual screening with low-dose chest CT without contrast in 12 months. 2. Aortic Atherosclerosis (ICD10-I70.0) and Emphysema (ICD10-J43.9). Electronically Signed   By: Delbert Phenix M.D.   On: 04/16/2023 21:09    Radiology No results found.  Procedures Procedures     Medications Ordered in ED Medications  ketorolac (TORADOL) 30 MG/ML injection 15 mg (has no administration in time range)  famotidine (PEPCID) IVPB 20 mg premix (has no administration in time range)  sodium chloride 0.9 % bolus 1,000 mL (1,000 mLs Intravenous New Bag/Given 04/21/23 0609)  dicyclomine (BENTYL) injection 20 mg (20 mg Intramuscular Given 04/21/23 0617)    ED Course/ Medical Decision Making/ A&P                                 Medical Decision Making Patient with a week of URI symptoms who started steroids and Cefpodixime on the evening of 04/17/23.  Then in the last 24 hours has developed nausea vomiting and diarrhea.    Amount and/or Complexity of Data Reviewed Independent Historian: spouse    Details: See above  External Data Reviewed: labs and notes.    Details: Previous urgent care note and labs reviewed  Labs: ordered.    Details: Negative covid and Flu A/B.  Normal white count 9.9, normal hemoglobin 16.9, normal platelet count.  Normal sodium 138, potassium slight low 3.4, creatinine elevated 1.48 but at patient's baseline.  Normal AST and ALT.   Radiology: ordered and independent interpretation performed.    Details: Previous sternotomy and borderline cardiomegaly by me on CXR  Risk Prescription drug management. Risk Details: Seen immediately in the ED.  EMS gave IV zofran.  IV bolus ordered.  Bentyl given as an anti-spasmotic.  C diff ordered as patient is on an anti-biotic.  Resting comfortably.  No  further emesis.      Final Clinical Impression(s) / ED Diagnoses Final diagnoses:  None   Signed out to Dr. Lynelle Doctor at change of shift pending C diff and reassessment  Rx / DC Orders ED Discharge Orders     None         Neeka Urista, MD 04/21/23 [phone]

## 2023-04-21 NOTE — H&P (Signed)
 History and Physical  Paul Hardy UXL:[id number] DOB: 06/14/1960 DOA: 04/21/2023  PCP: Everrett Coombe, DO   Chief Complaint: Abdominal pain, vomiting, diarrhea  HPI: Paul Hardy is a 63 y.o. male with medical history significant for CAD status post CABG, hypertension, COPD on room air being admitted to the hospital with concern for partial bowel obstruction versus ileus.  He has never had bowel obstruction in the past, denies any prior history of abdominal surgery.  Starting last evening, he had sudden onset of abdominal pain, watery diarrhea, and vomiting.  His abdomen has been bloated.  Denies any hematemesis, or blood in his stool.  No fevers.  No changes in his medications.  He presented to [facility], where evaluation as detailed below showed evidence of possible partial small bowel obstruction or ileus.  Due to his continued nausea and vomiting earlier this morning, NG tube was placed at the outside ER.  Patient states he has not had any diarrhea since presenting to the ER this morning.  Review of Systems: Please see HPI for pertinent positives and negatives. A complete 10 system review of systems are otherwise negative.  Past Medical History:  Diagnosis Date   CHF (congestive heart failure) (HCC)    Past Surgical History:  Procedure Laterality Date   CARDIAC SURGERY     CHOLECYSTECTOMY, LAPAROSCOPIC     VASECTOMY     Social History:  reports that he quit smoking about 2 years ago. His smoking use included cigarettes. He started smoking about 25 years ago. He has a 22.8 pack-year smoking history. He has never been exposed to tobacco smoke. He has never used smokeless tobacco. He reports current alcohol use. He reports that he does not use drugs.  Allergies  Allergen Reactions   Bee Venom Anaphylaxis   Iodinated Contrast Media Anaphylaxis    Rash and Shortness of Breath   Hydrochlorothiazide Other (See Comments)    Dehydration and muscle spasms. Lasix does the same    Lisinopril Other (See Comments) and Cough    Losartan is OK    Family History  Problem Relation Age of Onset   Cancer Mother    Diabetes Father    Heart Problems Father      Prior to Admission medications   Medication Sig Start Date End Date Taking? Authorizing Provider  albuterol (VENTOLIN HFA) 108 (90 Base) MCG/ACT inhaler INHALE 2 PUFFS INTO THE LUNGS EVERY 6 HOURS AS NEEDED FOR WHEEZING Patient taking differently: Inhale 2 puffs into the lungs every 6 (six) hours as needed for wheezing or shortness of breath. 03/17/22  Yes Glenford Bayley, NP  aspirin 81 MG chewable tablet Chew 81 mg by mouth daily.   Yes [provider]  atorvastatin (LIPITOR) 80 MG tablet Take 1 tablet (80 mg total) by mouth at bedtime. 09/13/22  Yes Everrett Coombe, DO  benzonatate (TESSALON) 100 MG capsule Take 1 capsule (100 mg total) by mouth every 8 (eight) hours. 04/17/23  Yes Raspet, Noberto Retort, PA-C  Budeson-Glycopyrrol-Formoterol (BREZTRI AEROSPHERE) 160-9-4.8 MCG/ACT AERO Inhale 2 puffs into the lungs 2 (two) times daily. 12/07/22  Yes Everrett Coombe, DO  cefpodoxime (VANTIN) 200 MG tablet Take 1 tablet (200 mg total) by mouth 2 (two) times daily for 7 days. 04/17/23 04/24/23 Yes Raspet, Noberto Retort, PA-C  clopidogrel (PLAVIX) 75 MG tablet Take 1 tablet (75 mg total) by mouth daily. 01/03/23  Yes Everrett Coombe, DO  DULoxetine (CYMBALTA) 30 MG capsule Take 1 capsule (30 mg total) by mouth daily. 09/13/22  Yes Everrett Coombe, DO  EPINEPHrine 0.3 mg/0.3 mL IJ SOAJ injection Inject 0.3 mg into the muscle as needed for anaphylaxis.   Yes [provider]  furosemide (LASIX) 40 MG tablet Take 80 mg by mouth as needed for edema. 40 mg in the morning and 40mg  night 12/02/20  Yes [provider]  KLOR-CON M20 20 MEQ tablet Take 20 mEq by mouth as needed (take with lasix). 12/02/20  Yes [provider]  metoprolol succinate (TOPROL-XL) 50 MG 24 hr tablet Take 1 tablet (50 mg total) by mouth daily.  09/13/22  Yes Everrett Coombe, DO  Multiple Vitamin (MULTIVITAMIN) tablet Take 1 tablet by mouth daily.   Yes [provider]  nitroGLYCERIN (NITROSTAT) 0.4 MG SL tablet Place 1 tablet (0.4 mg total) under the tongue every 5 (five) minutes as needed for chest pain. 03/16/23  Yes Everrett Coombe, DO  omeprazole (PRILOSEC) 40 MG capsule Take 40 mg by mouth daily. 06/25/21  Yes [provider]  predniSONE (DELTASONE) 20 MG tablet Take 2 tablets (40 mg total) by mouth daily for 5 days. 04/17/23 04/22/23 Yes Raspet, Noberto Retort, PA-C  ranolazine (RANEXA) 500 MG 12 hr tablet Take 500 mg by mouth 2 (two) times daily.   Yes [provider]  SYMBICORT 160-4.5 MCG/ACT inhaler INHALE 2 PUFFS INTO THE LUNGS IN THE MORNING AND AT BEDTIME 03/15/22  Yes Everrett Coombe, DO  tadalafil (CIALIS) 20 MG tablet Take 20 mg by mouth as needed for erectile dysfunction. 03/07/22  Yes [provider]  tamsulosin (FLOMAX) 0.4 MG CAPS capsule Take 0.4 mg by mouth daily. 02/14/23  Yes [provider]  ondansetron (ZOFRAN ODT) 4 MG disintegrating tablet Take 1 tablet (4 mg total) by mouth every 8 (eight) hours as needed for nausea or vomiting. Patient not taking: Reported on 04/21/2023 12/04/20   Everrett Coombe, DO    Physical Exam: BP (!) 144/88 (BP Location: Left Arm)   Pulse 93   Temp 98.3 F (36.8 C) (Oral)   Resp 16   Ht 6\' 1"  (1.854 m)   Wt 101 kg   SpO2 92%   BMI 29.38 kg/m  General:  Alert, oriented, calm, in no acute distress.  His wife is at the bedside.  He has an NG tube in place, however the canister and tubing are dry. Cardiovascular: RRR, no murmurs or rubs, no peripheral edema  Respiratory: clear to auscultation bilaterally, no wheezes, no crackles  Abdomen: soft, nontender, distended, no bowel tones heard  Skin: dry, no rashes  Musculoskeletal: no joint effusions, normal range of motion  Psychiatric: appropriate affect, normal speech  Neurologic: extraocular muscles  intact, clear speech, moving all extremities with intact sensorium.         Labs on Admission:  Basic Metabolic Panel: Recent Labs  Lab 04/21/23 0601  NA 138  K 3.4*  CL 100  CO2 24  GLUCOSE 185*  BUN 26*  CREATININE 1.48*  CALCIUM 8.9   Liver Function Tests: Recent Labs  Lab 04/21/23 0601  AST 24  ALT 28  ALKPHOS 94  BILITOT 1.3*  PROT 7.4  ALBUMIN 3.8   No results for input(s): "LIPASE", "AMYLASE" in the last 168 hours. No results for input(s): "AMMONIA" in the last 168 hours. CBC: Recent Labs  Lab 04/21/23 0601  WBC 9.9  NEUTROABS 8.5*  HGB 16.9  HCT 51.0  MCV 89.6  PLT 223   Cardiac Enzymes: No results for input(s): "CKTOTAL", "CKMB", "CKMBINDEX", "TROPONINI" in the last  168 hours. BNP (last 3 results) No results for input(s): "BNP" in the last 8760 hours.  ProBNP (last 3 results) No results for input(s): "PROBNP" in the last 8760 hours.  CBG: No results for input(s): "GLUCAP" in the last 168 hours.  Radiological Exams on Admission: DG Abdomen 1 View Result Date: 04/21/2023 CLINICAL DATA:  NG tube placement EXAM: ABDOMEN - 1 VIEW limited x-ray for tube placement COMPARISON:  CT 04/21/2023 FINDINGS: Limited x-ray of the upper abdomen demonstrates enteric tube with tip overlying the midbody of the stomach. There are dilated loops of small bowel in the upper mid abdomen. Few nondilated loops of colon. IMPRESSION: Limited x-ray of the upper abdomen for tube placement has tube overlying the midbody of the stomach. Electronically Signed   By: Karen Kays M.D.   On: 04/21/2023 15:27   CT ABDOMEN PELVIS WO CONTRAST Result Date: 04/21/2023 CLINICAL DATA:  Abdominal pain, acute, nonlocalized EXAM: CT ABDOMEN AND PELVIS WITHOUT CONTRAST TECHNIQUE: Multidetector CT imaging of the abdomen and pelvis was performed following the standard protocol without IV contrast. RADIATION DOSE REDUCTION: This exam was performed according to the departmental dose-optimization  program which includes automated exposure control, adjustment of the mA and/or kV according to patient size and/or use of iterative reconstruction technique. COMPARISON:  MR 04/09/2022 FINDINGS: Lower chest: No pleural or pericardial effusion. Coronary calcifications. Subsegmental scarring or atelectasis in the lung bases. Hepatobiliary: No focal liver abnormality is seen. Status post cholecystectomy. No biliary dilatation. Pancreas: Unremarkable. No pancreatic ductal dilatation or surrounding inflammatory changes. Spleen: Normal in size without focal abnormality. Adrenals/Urinary Tract: No adrenal mass. Peripheral left renal calcifications largest 10 mm mid pole. No hydronephrosis or ureterectasis. Stable small bilateral renal cysts. Urinary bladder physiologically distended. Stomach/Bowel: Stomach is markedly distended. Duodenum decompressed. Multiple dilated mid small bowel loops, without evident wall thickening or pneumatosis. Gradual transition to decompressed distal small bowel and terminal ileum. Normal appendix. The colon is incompletely distended, with scattered diverticula in ascending, transverse, and descending segments without adjacent inflammatory change. Vascular/Lymphatic: Mild calcified aortoiliac plaque without aneurysm. No abdominal or pelvic adenopathy. Reproductive: Prostate enlargement with central coarse calcifications. Other: No ascites.  No free air. Musculoskeletal: Sternotomy wires. IMPRESSION: 1. Markedly distended stomach and mid small bowel loops, with gradual transition to decompressed distal small bowel and terminal ileum. Findings may reflect ileus or partial small bowel obstruction. 2. Colonic diverticulosis. 3. Nonobstructive left nephrolithiasis. 4.  Aortic Atherosclerosis (ICD10-I70.0). Electronically Signed   By: Corlis Leak M.D.   On: 04/21/2023 11:04   DG Chest Portable 1 View Result Date: 04/21/2023 CLINICAL DATA:  Cough EXAM: PORTABLE CHEST 1 VIEW COMPARISON:  Chest  radiograph dated 04/17/2023 FINDINGS: Low lung volumes with bronchovascular crowding. Bibasilar patchy and linear opacities. No pleural effusion or pneumothorax. Similar enlarged cardiomediastinal silhouette. Median sternotomy wires are nondisplaced. IMPRESSION: Low lung volumes with bronchovascular crowding. Bibasilar patchy and linear opacities, likely atelectasis. Electronically Signed   By: Agustin Cree M.D.   On: 04/21/2023 08:24   Assessment/Plan Paul Hardy is a 63 y.o. male with medical history significant for CAD status post CABG, hypertension, COPD on room air being admitted to the hospital with concern for partial bowel obstruction versus ileus.   Partial SBO versus ileus-with abdominal pain, bloating, tenderness, vomiting.  Unclear etiology, he is not on any narcotic medications, has no prior history of intra-abdominal surgery. -Inpatient admission -Keep n.p.o. -Pain and nausea medication as needed -Hydrate with IV fluids -ER provider has discussed with [facility] who will  consult -Continue NG tube to low intermittent suction (contacted bedside nurse, to troubleshoot NG tube, x-ray has confirmed appropriate positioning)  CAD-status post CABG, currently no cardiac symptoms -Continue home cardiac medications including Toprol-XL, Ranexa  BPH-Flomax  GERD-IV Protonix  Hyperlipidemia-Lipitor  DVT prophylaxis: Lovenox     Code Status: Full Code  Consults called: General Surgery  Admission status: The appropriate patient status for this patient is INPATIENT. Inpatient status is judged to be reasonable and necessary in order to provide the required intensity of service to ensure the patient's safety. The patient's presenting symptoms, physical exam findings, and initial radiographic and laboratory data in the context of their chronic comorbidities is felt to place them at high risk for further clinical deterioration. Furthermore, it is not anticipated that the patient will be  medically stable for discharge from the hospital within 2 midnights of admission.    I certify that at the point of admission it is my clinical judgment that the patient will require inpatient hospital care spanning beyond 2 midnights from the point of admission due to high intensity of service, high risk for further deterioration and high frequency of surveillance required  Time spent: 59 minutes  Zayanna Pundt Sharlette Dense MD [facility] Pager [phone]  If 7PM-7AM, please contact night-coverage [email or web address] Password [email or web address]  04/21/2023, 5:22 PM

## 2023-04-21 NOTE — ED Notes (Signed)
 Order placed for NG tube. Per EDP place on low intermittent suction while awaiting confirmation of placement. Large amount of stomach content running out of NG tube prior to be placed on suction.

## 2023-04-22 DIAGNOSIS — K567 Ileus, unspecified: Secondary | ICD-10-CM | POA: Insufficient documentation

## 2023-04-22 DIAGNOSIS — K529 Noninfective gastroenteritis and colitis, unspecified: Secondary | ICD-10-CM

## 2023-04-22 DIAGNOSIS — E876 Hypokalemia: Secondary | ICD-10-CM

## 2023-04-22 LAB — HIV ANTIBODY (ROUTINE TESTING W REFLEX): HIV Screen 4th Generation wRfx: NONREACTIVE

## 2023-04-22 LAB — BASIC METABOLIC PANEL
Anion gap: 11 (ref 5–15)
BUN: 27 mg/dL — ABNORMAL HIGH (ref 8–23)
CO2: 24 mmol/L (ref 22–32)
Calcium: 8.3 mg/dL — ABNORMAL LOW (ref 8.9–10.3)
Chloride: 105 mmol/L (ref 98–111)
Creatinine, Ser: 1.28 mg/dL — ABNORMAL HIGH (ref 0.61–1.24)
GFR, Estimated: >60 mL/min (ref >60)
Glucose, Bld: 123 mg/dL — ABNORMAL HIGH (ref 70–99)
Potassium: 4 mmol/L (ref 3.5–5.1)
Sodium: 140 mmol/L (ref 135–145)

## 2023-04-22 LAB — CBC
HCT: 51.3 % (ref 39.0–52.0)
Hemoglobin: 15.6 g/dL (ref 13.0–17.0)
MCH: 29.4 pg (ref 26.0–34.0)
MCHC: 30.4 g/dL (ref 30.0–36.0)
MCV: 96.8 fL (ref 80.0–100.0)
Platelets: 195 10*3/uL (ref 150–400)
RBC: 5.3 MIL/uL (ref 4.22–5.81)
RDW: 13.8 % (ref 11.5–15.5)
WBC: 10.8 10*3/uL — ABNORMAL HIGH (ref 4.0–10.5)
nRBC: 0 % (ref 0.0–0.2)

## 2023-04-22 LAB — PHOSPHORUS: Phosphorus: 2.4 mg/dL — ABNORMAL LOW (ref 2.5–4.6)

## 2023-04-22 LAB — C DIFFICILE QUICK SCREEN W PCR REFLEX
C Diff antigen: NEGATIVE
C Diff interpretation: NOT DETECTED
C Diff toxin: NEGATIVE

## 2023-04-22 LAB — MAGNESIUM: Magnesium: 2.2 mg/dL (ref 1.7–2.4)

## 2023-04-22 MED ORDER — NAPHAZOLINE-GLYCERIN 0.012-0.25 % OP SOLN: 1.0000 [drp] | Freq: Four times a day (QID) | OPHTHALMIC | Status: DC | PRN

## 2023-04-22 MED ORDER — MENTHOL 3 MG MT LOZG: 1.0000 | LOZENGE | OROMUCOSAL | Status: DC | PRN

## 2023-04-22 MED ORDER — LACTATED RINGERS IV BOLUS: 1000.0000 mL | Freq: Three times a day (TID) | INTRAVENOUS | Status: AC | PRN

## 2023-04-22 MED ORDER — SIMETHICONE 40 MG/0.6ML PO SUSP
80.0000 mg | Freq: Four times a day (QID) | ORAL | Status: DC | PRN
  Administered 2023-04-23 – 2023-04-25 (×2): 80 mg via ORAL
  Filled 2023-04-22 (×3): qty 1.2

## 2023-04-22 MED ORDER — ALUM & MAG HYDROXIDE-SIMETH 200-200-20 MG/5ML PO SUSP
30.0000 mL | Freq: Four times a day (QID) | ORAL | Status: DC | PRN
  Administered 2023-04-23 – 2023-04-24 (×2): 30 mL via ORAL
  Filled 2023-04-22 (×2): qty 30

## 2023-04-22 MED ORDER — POTASSIUM CHLORIDE 10 MEQ/100ML IV SOLN
10.0000 meq | INTRAVENOUS | Status: AC
  Administered 2023-04-22 (×2): 10 meq via INTRAVENOUS
  Filled 2023-04-22 (×2): qty 100

## 2023-04-22 MED ORDER — LACTATED RINGERS IV BOLUS
1000.0000 mL | Freq: Once | INTRAVENOUS | Status: AC
  Administered 2023-04-22: 1000 mL via INTRAVENOUS

## 2023-04-22 MED ORDER — POTASSIUM CHLORIDE 10 MEQ/100ML IV SOLN: 10.0000 meq | INTRAVENOUS | Status: DC

## 2023-04-22 MED ORDER — MAGIC MOUTHWASH: 15.0000 mL | Freq: Four times a day (QID) | ORAL | Status: DC | PRN

## 2023-04-22 MED ORDER — PHENOL 1.4 % MT LIQD
2.0000 | OROMUCOSAL | Status: DC | PRN
  Administered 2023-04-22: 2 via OROMUCOSAL
  Filled 2023-04-22: qty 177

## 2023-04-22 MED ORDER — HYDRALAZINE HCL 20 MG/ML IJ SOLN: 10.0000 mg | Freq: Three times a day (TID) | INTRAMUSCULAR | Status: DC | PRN

## 2023-04-22 MED ORDER — PROCHLORPERAZINE EDISYLATE 10 MG/2ML IJ SOLN: 5.0000 mg | INTRAMUSCULAR | Status: DC | PRN

## 2023-04-22 MED ORDER — SALINE SPRAY 0.65 % NA SOLN: 1.0000 | Freq: Four times a day (QID) | NASAL | Status: DC | PRN

## 2023-04-22 MED ORDER — BOOST PLUS PO LIQD
237.0000 mL | Freq: Two times a day (BID) | ORAL | Status: DC
  Filled 2023-04-22 (×8): qty 237

## 2023-04-22 NOTE — Plan of Care (Signed)

## 2023-04-22 NOTE — Progress Notes (Addendum)
 04/22/2023  Paul Hardy [id number] 1960-08-18  CARE TEAM: PCP: Everrett Coombe, DO  Outpatient Care Team: Patient Care Team: Everrett Coombe, DO as PCP - General (Family Medicine) Means, Greig Castilla, MD as Referring Physician (Cardiology) Carlena Bjornstad, MD (Cardiology)  Inpatient Treatment Team: Treatment Team:  Briant Cedar, MD [facility], Md, MD Alton Revere, MD Lynden Ang, [facility] Nonda Lou, RN Diop, Awa, NT Clydia Llano, RN Laural Benes, [place] M   Problem List:   Principal Problem:   Ileus [facility]) Active Problems:   Chronic kidney disease (CKD), stage III (moderate) (HCC)   OSA on CPAP   COPD (chronic obstructive pulmonary disease) (HCC)   SBO (small bowel obstruction) (HCC)   Hypokalemia   Enteritis   * No surgery found *      Assessment [facility] Stay = 1 days)      Probable enteritis or ileus over bowel obstruction.    Plan:  He has not had prior surgery and has no evidence of mass tumor or hernia to explain why he may have some obstruction.  It is a subtle call so I suspect this is not truly mechanical adhesions but just enteritis/ileus  He has had a lot of bowel movements and feeling better.  Because he did have some nausea and retching with NG tube in place, will do a nasogastric tube clamping trial.  Clamp NG tube Clear liquids Check residual in 6 hours.   If less than 300 mL, remove NG tube and advance to dysphagia 1 or full liquid diet today.  If greater than 300 mL return after 6 hours of NG tube clamping on liquids or worsening pain or nausea, return to low remittent wall suction and retry tomorrow  Follow electrolytes.  Keep potassium above 4, magnesium above 2, phosphorus above 3.  K low this morning = I gave 40 mEq.  Check magnesium as well  Creatinine coming down a hopeful sign that he is less dehydrated.  Continue IV fluid resuscitation.  IV fluid bolus x 1 more  -VTE prophylaxis- SCDs, etc -mobilize as tolerated to  help recovery -Defer to diarrhea or enteritis workup to main service.  I doubt C. difficile but someone's put him on precautions just in case      I reviewed nursing notes, ED provider notes, hospitalist notes, last 24 h vitals and pain scores, last 48 h intake and output, last 24 h labs and trends, and last 24 h imaging results.  I have reviewed this patient's available data, including medical history, events of note, test results, etc as part of my evaluation.   A significant portion of that time was spent in counseling. Care during the described time interval was provided by me.  This care required moderate level of medical decision making.  04/22/2023    Subjective: (Chief complaint)  Patient with many loose bowel movements through the night.  Almost a point of being incontinent.  Did have episode of nausea and retching with NG tube in place but felt that was more irritation.  Not as nauseated and actually thirsty today.  Wife in room.  Nurse just outside room.  Denies abdominal pain.  Objective:  Vital signs:  Vitals:   04/21/23 1621 04/21/23 1938 04/22/23 0153 04/22/23 0546  BP: (!) 144/88 (!) 153/102 (!) 134/98 (!) 136/97  Pulse: 93 96 93 72  Resp: 16 20 20 20   Temp: 98.3 F (36.8 C) 98 F (36.7 C) 98 F (36.7 C)   TempSrc: Oral  Oral  SpO2: 92% 98% 98% 98%  Weight:      Height:        Last BM Date : 04/21/23  Intake/Output   Yesterday:  02/28 0701 - 03/01 0700 In: 2047.3 [I.V.:1000; IV Piggyback:1047.3] Out: 800 [Emesis/NG output:800] This shift:  No intake/output data recorded.  Bowel function:  Flatus: YES  BM:  YES  Drain: G-tube with thickened brown effluent in canister.  Low volume   Physical Exam:  General: Pt awake/alert in no acute distress Eyes: PERRL, normal EOM.  Sclera clear.  No icterus Neuro: CN II-XII intact w/o focal sensory/motor deficits. Lymph: No head/neck/groin lymphadenopathy Psych:  No delerium/psychosis/paranoia.   Oriented x 4 HENT: Normocephalic, Mucus membranes moist.  No thrush Neck: Supple, No tracheal deviation.  No obvious thyromegaly Chest: No pain to chest wall compression.  Good respiratory excursion.  No audible wheezing CV:  Pulses intact.  Regular rhythm.  No major extremity edema MS: Normal AROM mjr joints.  No obvious deformity  Abdomen: Soft.  Moderately distended.  These Nontender.  No umbilical or inguinal hernias.  No incisions.  No guarding.  No evidence of peritonitis.  No incarcerated hernias.  Ext:   No deformity.  No mjr edema.  No cyanosis Skin: No petechiae / purpurea.  No major sores.  Warm and dry    Results:   Cultures: Recent Results (from the past 720 hours)  Resp panel by RT-PCR (RSV, Flu A&B, Covid) Anterior Nasal Swab     Status: None   Collection Time: 04/21/23  6:01 AM   Specimen: Anterior Nasal Swab  Result Value Ref Range Status   SARS Coronavirus 2 by RT PCR NEGATIVE NEGATIVE Final    Comment: (NOTE) SARS-CoV-2 target nucleic acids are NOT DETECTED.  The SARS-CoV-2 RNA is generally detectable in upper respiratory specimens during the acute phase of infection. The lowest concentration of SARS-CoV-2 viral copies this assay can detect is 138 copies/mL. A negative result does not preclude SARS-Cov-2 infection and should not be used as the sole basis for treatment or other patient management decisions. A negative result may occur with  improper specimen collection/handling, submission of specimen other than nasopharyngeal swab, presence of viral mutation(s) within the areas targeted by this assay, and inadequate number of viral copies(<138 copies/mL). A negative result must be combined with clinical observations, patient history, and epidemiological information. The expected result is Negative.  Fact Sheet for Patients:  BloggerCourse.com  Fact Sheet for Healthcare Providers:  SeriousBroker.it  This  test is no t yet approved or cleared by the [place] FDA and  has been authorized for detection and/or diagnosis of SARS-CoV-2 by FDA under an Emergency Use Authorization (EUA). This EUA will remain  in effect (meaning this test can be used) for the duration of the COVID-19 declaration under Section 564(b)(1) of the Act, 21 U.S.C.section 360bbb-3(b)(1), unless the authorization is terminated  or revoked sooner.       Influenza A by PCR NEGATIVE NEGATIVE Final   Influenza B by PCR NEGATIVE NEGATIVE Final    Comment: (NOTE) The Xpert Xpress SARS-CoV-2/FLU/RSV plus assay is intended as an aid in the diagnosis of influenza from Nasopharyngeal swab specimens and should not be used as a sole basis for treatment. Nasal washings and aspirates are unacceptable for Xpert Xpress SARS-CoV-2/FLU/RSV testing.  Fact Sheet for Patients: BloggerCourse.com  Fact Sheet for Healthcare Providers: SeriousBroker.it  This test is not yet approved or cleared by the [place] FDA and has been authorized for detection  and/or diagnosis of SARS-CoV-2 by FDA under an Emergency Use Authorization (EUA). This EUA will remain in effect (meaning this test can be used) for the duration of the COVID-19 declaration under Section 564(b)(1) of the Act, 21 U.S.C. section 360bbb-3(b)(1), unless the authorization is terminated or revoked.     Resp Syncytial Virus by PCR NEGATIVE NEGATIVE Final    Comment: (NOTE) Fact Sheet for Patients: BloggerCourse.com  Fact Sheet for Healthcare Providers: SeriousBroker.it  This test is not yet approved or cleared by the [place] FDA and has been authorized for detection and/or diagnosis of SARS-CoV-2 by FDA under an Emergency Use Authorization (EUA). This EUA will remain in effect (meaning this test can be used) for the duration of the COVID-19 declaration under  Section 564(b)(1) of the Act, 21 U.S.C. section 360bbb-3(b)(1), unless the authorization is terminated or revoked.  Performed at [facility], [address] [place], [place] [address]     Labs: Results for orders placed or performed during the hospital encounter of 04/21/23 (from the past 48 hours)  Resp panel by RT-PCR (RSV, Flu A&B, Covid) Anterior Nasal Swab     Status: None   Collection Time: 04/21/23  6:01 AM   Specimen: Anterior Nasal Swab  Result Value Ref Range   SARS Coronavirus 2 by RT PCR NEGATIVE NEGATIVE    Comment: (NOTE) SARS-CoV-2 target nucleic acids are NOT DETECTED.  The SARS-CoV-2 RNA is generally detectable in upper respiratory specimens during the acute phase of infection. The lowest concentration of SARS-CoV-2 viral copies this assay can detect is 138 copies/mL. A negative result does not preclude SARS-Cov-2 infection and should not be used as the sole basis for treatment or other patient management decisions. A negative result may occur with  improper specimen collection/handling, submission of specimen other than nasopharyngeal swab, presence of viral mutation(s) within the areas targeted by this assay, and inadequate number of viral copies(<138 copies/mL). A negative result must be combined with clinical observations, patient history, and epidemiological information. The expected result is Negative.  Fact Sheet for Patients:  BloggerCourse.com  Fact Sheet for Healthcare Providers:  SeriousBroker.it  This test is no t yet approved or cleared by the [place] FDA and  has been authorized for detection and/or diagnosis of SARS-CoV-2 by FDA under an Emergency Use Authorization (EUA). This EUA will remain  in effect (meaning this test can be used) for the duration of the COVID-19 declaration under Section 564(b)(1) of the Act, 21 U.S.C.section 360bbb-3(b)(1), unless the authorization is  terminated  or revoked sooner.       Influenza A by PCR NEGATIVE NEGATIVE   Influenza B by PCR NEGATIVE NEGATIVE    Comment: (NOTE) The Xpert Xpress SARS-CoV-2/FLU/RSV plus assay is intended as an aid in the diagnosis of influenza from Nasopharyngeal swab specimens and should not be used as a sole basis for treatment. Nasal washings and aspirates are unacceptable for Xpert Xpress SARS-CoV-2/FLU/RSV testing.  Fact Sheet for Patients: BloggerCourse.com  Fact Sheet for Healthcare Providers: SeriousBroker.it  This test is not yet approved or cleared by the [place] FDA and has been authorized for detection and/or diagnosis of SARS-CoV-2 by FDA under an Emergency Use Authorization (EUA). This EUA will remain in effect (meaning this test can be used) for the duration of the COVID-19 declaration under Section 564(b)(1) of the Act, 21 U.S.C. section 360bbb-3(b)(1), unless the authorization is terminated or revoked.     Resp Syncytial Virus by PCR NEGATIVE NEGATIVE  Comment: (NOTE) Fact Sheet for Patients: BloggerCourse.com  Fact Sheet for Healthcare Providers: SeriousBroker.it  This test is not yet approved or cleared by the [place] FDA and has been authorized for detection and/or diagnosis of SARS-CoV-2 by FDA under an Emergency Use Authorization (EUA). This EUA will remain in effect (meaning this test can be used) for the duration of the COVID-19 declaration under Section 564(b)(1) of the Act, 21 U.S.C. section 360bbb-3(b)(1), unless the authorization is terminated or revoked.  Performed at [facility], [address] [place], [place] [address]   CBC with Differential     Status: Abnormal   Collection Time: 04/21/23  6:01 AM  Result Value Ref Range   WBC 9.9 4.0 - 10.5 K/uL   RBC 5.69 4.22 - 5.81 MIL/uL   Hemoglobin 16.9 13.0 - 17.0 g/dL   HCT  13.0 86.5 - 78.4 %   MCV 89.6 80.0 - 100.0 fL   MCH 29.7 26.0 - 34.0 pg   MCHC 33.1 30.0 - 36.0 g/dL   RDW 69.6 29.5 - 28.4 %   Platelets 223 150 - 400 K/uL   nRBC 0.0 0.0 - 0.2 %   Neutrophils Relative % 85 %   Neutro Abs 8.5 (H) 1.7 - 7.7 K/uL   Lymphocytes Relative 7 %   Lymphs Abs 0.7 0.7 - 4.0 K/uL   Monocytes Relative 7 %   Monocytes Absolute 0.7 0.1 - 1.0 K/uL   Eosinophils Relative 0 %   Eosinophils Absolute 0.0 0.0 - 0.5 K/uL   Basophils Relative 0 %   Basophils Absolute 0.0 0.0 - 0.1 K/uL   Immature Granulocytes 1 %   Abs Immature Granulocytes 0.06 0.00 - 0.07 K/uL    Comment: Performed at [facility], [address] [place], [place] [address]  Comprehensive metabolic panel     Status: Abnormal   Collection Time: 04/21/23  6:01 AM  Result Value Ref Range   Sodium 138 135 - 145 mmol/L   Potassium 3.4 (L) 3.5 - 5.1 mmol/L   Chloride 100 98 - 111 mmol/L   CO2 24 22 - 32 mmol/L   Glucose, Bld 185 (H) 70 - 99 mg/dL    Comment: Glucose reference range applies only to samples taken after fasting for at least 8 hours.   BUN 26 (H) 8 - 23 mg/dL   Creatinine, Ser 0.10 (H) 0.61 - 1.24 mg/dL   Calcium 8.9 8.9 - 27.2 mg/dL   Total Protein 7.4 6.5 - 8.1 g/dL   Albumin 3.8 3.5 - 5.0 g/dL   AST 24 15 - 41 U/L   ALT 28 0 - 44 U/L   Alkaline Phosphatase 94 38 - 126 U/L   Total Bilirubin 1.3 (H) 0.0 - 1.2 mg/dL   GFR, Estimated 53 (L) >60 mL/min    Comment: (NOTE) Calculated using the CKD-EPI Creatinine Equation (2021)    Anion gap 14 5 - 15    Comment: Performed at [facility], [address] [place], [place] [address]    Imaging / Studies: DG Abdomen 1 View Result Date: 04/21/2023 CLINICAL DATA:  NG tube placement EXAM: ABDOMEN - 1 VIEW limited x-ray for tube placement COMPARISON:  CT 04/21/2023 FINDINGS: Limited x-ray of the upper abdomen demonstrates enteric tube with tip overlying the midbody of the stomach. There are dilated loops of small bowel  in the upper mid abdomen. Few nondilated loops of colon. IMPRESSION: Limited x-ray of the upper abdomen  for tube placement has tube overlying the midbody of the stomach. Electronically Signed   By: Karen Kays M.D.   On: 04/21/2023 15:27   CT ABDOMEN PELVIS WO CONTRAST Result Date: 04/21/2023 CLINICAL DATA:  Abdominal pain, acute, nonlocalized EXAM: CT ABDOMEN AND PELVIS WITHOUT CONTRAST TECHNIQUE: Multidetector CT imaging of the abdomen and pelvis was performed following the standard protocol without IV contrast. RADIATION DOSE REDUCTION: This exam was performed according to the departmental dose-optimization program which includes automated exposure control, adjustment of the mA and/or kV according to patient size and/or use of iterative reconstruction technique. COMPARISON:  MR 04/09/2022 FINDINGS: Lower chest: No pleural or pericardial effusion. Coronary calcifications. Subsegmental scarring or atelectasis in the lung bases. Hepatobiliary: No focal liver abnormality is seen. Status post cholecystectomy. No biliary dilatation. Pancreas: Unremarkable. No pancreatic ductal dilatation or surrounding inflammatory changes. Spleen: Normal in size without focal abnormality. Adrenals/Urinary Tract: No adrenal mass. Peripheral left renal calcifications largest 10 mm mid pole. No hydronephrosis or ureterectasis. Stable small bilateral renal cysts. Urinary bladder physiologically distended. Stomach/Bowel: Stomach is markedly distended. Duodenum decompressed. Multiple dilated mid small bowel loops, without evident wall thickening or pneumatosis. Gradual transition to decompressed distal small bowel and terminal ileum. Normal appendix. The colon is incompletely distended, with scattered diverticula in ascending, transverse, and descending segments without adjacent inflammatory change. Vascular/Lymphatic: Mild calcified aortoiliac plaque without aneurysm. No abdominal or pelvic adenopathy. Reproductive: Prostate  enlargement with central coarse calcifications. Other: No ascites.  No free air. Musculoskeletal: Sternotomy wires. IMPRESSION: 1. Markedly distended stomach and mid small bowel loops, with gradual transition to decompressed distal small bowel and terminal ileum. Findings may reflect ileus or partial small bowel obstruction. 2. Colonic diverticulosis. 3. Nonobstructive left nephrolithiasis. 4.  Aortic Atherosclerosis (ICD10-I70.0). Electronically Signed   By: Corlis Leak M.D.   On: 04/21/2023 11:04   DG Chest Portable 1 View Result Date: 04/21/2023 CLINICAL DATA:  Cough EXAM: PORTABLE CHEST 1 VIEW COMPARISON:  Chest radiograph dated 04/17/2023 FINDINGS: Low lung volumes with bronchovascular crowding. Bibasilar patchy and linear opacities. No pleural effusion or pneumothorax. Similar enlarged cardiomediastinal silhouette. Median sternotomy wires are nondisplaced. IMPRESSION: Low lung volumes with bronchovascular crowding. Bibasilar patchy and linear opacities, likely atelectasis. Electronically Signed   By: Hardy Cree M.D.   On: 04/21/2023 08:24    Medications / Allergies: per chart  Antibiotics: Anti-infectives (From admission, onward)    Start     Dose/Rate Route Frequency Ordered Stop   04/22/23 0800  cefUROXime (CEFTIN) tablet 500 mg  Status:  Discontinued        500 mg Oral 2 times daily with meals 04/21/23 1722 04/22/23 0815         Note: Portions of this report may have been transcribed using voice recognition software. Every effort was made to ensure accuracy; however, inadvertent computerized transcription errors may be present.   Any transcriptional errors that result from this process are unintentional.    Ardeth Sportsman, MD, FACS, MASCRS Esophageal, Gastrointestinal & Colorectal Surgery Robotic and Minimally Invasive Surgery  [facility] [address] [place], [place] [address] [phone] Fax [phone]  Main  CONTACT INFORMATION: Weekday (9AM-5PM): Call [facility] main office at [phone] Weeknight (5PM-9AM) or Weekend/Holiday: Check EPIC "Web Links" tab & use "AMION" (password " [email or web address]") for General Surgery [facility] coverage  Please, DO NOT use SecureChat  (it is not reliable communication to reach operating surgeons & will lead to a delay in  care).   Epic staff messaging available for outptient concerns needing 1-2 business day response.      04/22/2023  8:25 AM

## 2023-04-22 NOTE — Progress Notes (Signed)
 PROGRESS NOTE  Paul Hardy UEA:[id number] DOB: 11-18-1960 DOA: 04/21/2023 PCP: Everrett Coombe, DO  HPI/Recap of past 24 hours: Paul Hardy is a 63 y.o. male with medical history significant for CAD s/p CABG, HTN, COPD being admitted to the hospital with concern for partial bowel obstruction versus ileus. Pt reported sudden onset of abdominal pain, watery diarrhea, and vomiting X 1 day. Pt presented to [facility], where CT abd/pelvis showed evidence of possible partial small bowel obstruction or ileus.  Of note, patient was recently started on p.o. cefpodoxime and steroids for COPD exacerbation (has not completed both antibiotics and steroid dose). Due to persistent N/V, NG tube was placed. Gen surgery consulted.  Patient admitted for further management.    Today, saw patient at bedside, NG tube clamped by general surgery.  Patient trying out ice chips.  Denied any further nausea/vomiting, still with abdominal tenderness and some diarrhea.  Has been noted to require oxygen during this admission, likely from his COPD.  Wife at bedside.   Assessment/Plan: Principal Problem:   Ileus (HCC) Active Problems:   Chronic kidney disease (CKD), stage III (moderate) (HCC)   OSA on CPAP   COPD (chronic obstructive pulmonary disease) (HCC)   SBO (small bowel obstruction) (HCC)   Hypokalemia   Enteritis   Partial SBO/ileus versus enteritis Presents with abdominal pain, vomiting, diarrhea NG tube placed due to persistent nausea/vomiting, clamping trial in progress Currently afebrile with no leukocytosis CT abdomen/pelvis showed markedly distended stomach and mid small bowel loops with gradual transition to decompressed distal small bowel and terminal ileum C. difficile negative, GI panel pending General Surgery consulted, appreciate recs Pain management, antiemetics  COPD Currently saturating well on room air Currently appears stable, not in exacerbation Continue to hold home cefpodoxime  and steroids that was started for COPD exacerbation for now Supplemental O2 as needed   CAD S/p CABG, currently chest pain-free Continue home Toprol-XL, Ranexa once able   BPH Flomax   GERD Protonix   Hyperlipidemia Lipitor    Estimated body mass index is 29.38 kg/m as calculated from the following:   Height as of this encounter: 6\' 1"  (1.854 m).   Weight as of this encounter: 101 kg.     Code Status: Full  Family Communication: Wife at bedside  Disposition Plan: Status is: Inpatient Remains inpatient appropriate because: Level of care      Consultants: General surgery  Procedures: None  Antimicrobials: None  DVT prophylaxis: Lovenox   Objective: Vitals:   04/21/23 1938 04/22/23 0153 04/22/23 0546 04/22/23 1322  BP: (!) 153/102 (!) 134/98 (!) 136/97 136/81  Pulse: 96 93 72 68  Resp: 20 20 20 18   Temp: 98 F (36.7 C) 98 F (36.7 C)  97.8 F (36.6 C)  TempSrc:  Oral  Oral  SpO2: 98% 98% 98% 99%  Weight:      Height:        Intake/Output Summary (Last 24 hours) at 04/22/2023 1613 Last data filed at 04/22/2023 1400 Gross per 24 hour  Intake 1060 ml  Output 150 ml  Net 910 ml   Filed Weights   04/21/23 0551  Weight: 101 kg    Exam: General: NAD  Cardiovascular: S1, S2 present Respiratory: CTAB Abdomen: Soft, tender, nondistended, bowel sounds present Musculoskeletal: No bilateral pedal edema noted Skin: Normal Psychiatry: Normal mood     Data Reviewed: CBC: Recent Labs  Lab 04/21/23 0601 04/22/23 0800  WBC 9.9 10.8*  NEUTROABS 8.5*  --   HGB  16.9 15.6  HCT 51.0 51.3  MCV 89.6 96.8  PLT 223 195   Basic Metabolic Panel: Recent Labs  Lab 04/21/23 0601 04/22/23 0800 04/22/23 0824  NA 138 140  --   K 3.4* 4.0  --   CL 100 105  --   CO2 24 24  --   GLUCOSE 185* 123*  --   BUN 26* 27*  --   CREATININE 1.48* 1.28*  --   CALCIUM 8.9 8.3*  --   MG  --   --  2.2  PHOS  --   --  2.4*   GFR: Estimated Creatinine Clearance:  74.7 mL/min (A) (by C-G formula based on SCr of 1.28 mg/dL (H)). Liver Function Tests: Recent Labs  Lab 04/21/23 0601  AST 24  ALT 28  ALKPHOS 94  BILITOT 1.3*  PROT 7.4  ALBUMIN 3.8   No results for input(s): "LIPASE", "AMYLASE" in the last 168 hours. No results for input(s): "AMMONIA" in the last 168 hours. Coagulation Profile: No results for input(s): "INR", "PROTIME" in the last 168 hours. Cardiac Enzymes: No results for input(s): "CKTOTAL", "CKMB", "CKMBINDEX", "TROPONINI" in the last 168 hours. BNP (last 3 results) No results for input(s): "PROBNP" in the last 8760 hours. HbA1C: No results for input(s): "HGBA1C" in the last 72 hours. CBG: No results for input(s): "GLUCAP" in the last 168 hours. Lipid Profile: No results for input(s): "CHOL", "HDL", "LDLCALC", "TRIG", "CHOLHDL", "LDLDIRECT" in the last 72 hours. Thyroid Function Tests: No results for input(s): "TSH", "T4TOTAL", "FREET4", "T3FREE", "THYROIDAB" in the last 72 hours. Anemia Panel: No results for input(s): "VITAMINB12", "FOLATE", "FERRITIN", "TIBC", "IRON", "RETICCTPCT" in the last 72 hours. Urine analysis:    Component Value Date/Time   BILIRUBINUR small (A) 11/14/2022 1854   BILIRUBINUR negative 04/05/2022 0000   KETONESUR negative 11/14/2022 1854   PROTEINUR =30 (A) 11/14/2022 1854   PROTEINUR Negative 04/05/2022 0000   UROBILINOGEN 2.0 (A) 11/14/2022 1854   NITRITE Negative 11/14/2022 1854   NITRITE negative 04/05/2022 0000   LEUKOCYTESUR Negative 11/14/2022 1854   Sepsis Labs: @LABRCNTIP (procalcitonin:4,lacticidven:4)  ) Recent Results (from the past 240 hours)  Resp panel by RT-PCR (RSV, Flu A&B, Covid) Anterior Nasal Swab     Status: None   Collection Time: 04/21/23  6:01 AM   Specimen: Anterior Nasal Swab  Result Value Ref Range Status   SARS Coronavirus 2 by RT PCR NEGATIVE NEGATIVE Final    Comment: (NOTE) SARS-CoV-2 target nucleic acids are NOT DETECTED.  The SARS-CoV-2 RNA is  generally detectable in upper respiratory specimens during the acute phase of infection. The lowest concentration of SARS-CoV-2 viral copies this assay can detect is 138 copies/mL. A negative result does not preclude SARS-Cov-2 infection and should not be used as the sole basis for treatment or other patient management decisions. A negative result may occur with  improper specimen collection/handling, submission of specimen other than nasopharyngeal swab, presence of viral mutation(s) within the areas targeted by this assay, and inadequate number of viral copies(<138 copies/mL). A negative result must be combined with clinical observations, patient history, and epidemiological information. The expected result is Negative.  Fact Sheet for Patients:  BloggerCourse.com  Fact Sheet for Healthcare Providers:  SeriousBroker.it  This test is no t yet approved or cleared by the [place] FDA and  has been authorized for detection and/or diagnosis of SARS-CoV-2 by FDA under an Emergency Use Authorization (EUA). This EUA will remain  in effect (meaning this test can be used)  for the duration of the COVID-19 declaration under Section 564(b)(1) of the Act, 21 U.S.C.section 360bbb-3(b)(1), unless the authorization is terminated  or revoked sooner.       Influenza A by PCR NEGATIVE NEGATIVE Final   Influenza B by PCR NEGATIVE NEGATIVE Final    Comment: (NOTE) The Xpert Xpress SARS-CoV-2/FLU/RSV plus assay is intended as an aid in the diagnosis of influenza from Nasopharyngeal swab specimens and should not be used as a sole basis for treatment. Nasal washings and aspirates are unacceptable for Xpert Xpress SARS-CoV-2/FLU/RSV testing.  Fact Sheet for Patients: BloggerCourse.com  Fact Sheet for Healthcare Providers: SeriousBroker.it  This test is not yet approved or cleared by the [place] FDA and has been authorized for detection and/or diagnosis of SARS-CoV-2 by FDA under an Emergency Use Authorization (EUA). This EUA will remain in effect (meaning this test can be used) for the duration of the COVID-19 declaration under Section 564(b)(1) of the Act, 21 U.S.C. section 360bbb-3(b)(1), unless the authorization is terminated or revoked.     Resp Syncytial Virus by PCR NEGATIVE NEGATIVE Final    Comment: (NOTE) Fact Sheet for Patients: BloggerCourse.com  Fact Sheet for Healthcare Providers: SeriousBroker.it  This test is not yet approved or cleared by the [place] FDA and has been authorized for detection and/or diagnosis of SARS-CoV-2 by FDA under an Emergency Use Authorization (EUA). This EUA will remain in effect (meaning this test can be used) for the duration of the COVID-19 declaration under Section 564(b)(1) of the Act, 21 U.S.C. section 360bbb-3(b)(1), unless the authorization is terminated or revoked.  Performed at [facility], [address] [place], [place] [address]       Studies: No results found.  Scheduled Meds:  enoxaparin (LOVENOX) injection  40 mg Subcutaneous Q24H   lactose free nutrition  237 mL Oral BID BM   pantoprazole (PROTONIX) IV  40 mg Intravenous Daily    Continuous Infusions:  lactated ringers       LOS: 1 day     Briant Cedar, MD [facility]  If 7PM-7AM, please contact night-coverage [email or web address] 04/22/2023, 4:13 PM

## 2023-04-23 DIAGNOSIS — K567 Ileus, unspecified: Secondary | ICD-10-CM | POA: Diagnosis not present

## 2023-04-23 LAB — GASTROINTESTINAL PANEL BY PCR, STOOL (REPLACES STOOL CULTURE)

## 2023-04-23 LAB — BASIC METABOLIC PANEL
Anion gap: 8 (ref 5–15)
BUN: 22 mg/dL (ref 8–23)
CO2: 26 mmol/L (ref 22–32)
Calcium: 8 mg/dL — ABNORMAL LOW (ref 8.9–10.3)
Chloride: 103 mmol/L (ref 98–111)
Creatinine, Ser: 1.45 mg/dL — ABNORMAL HIGH (ref 0.61–1.24)
GFR, Estimated: 54 mL/min — ABNORMAL LOW (ref >60)
Glucose, Bld: 107 mg/dL — ABNORMAL HIGH (ref 70–99)
Potassium: 3.9 mmol/L (ref 3.5–5.1)
Sodium: 137 mmol/L (ref 135–145)

## 2023-04-23 LAB — CBC WITH DIFFERENTIAL/PLATELET
Abs Immature Granulocytes: 0.03 10*3/uL (ref 0.00–0.07)
Basophils Absolute: 0 10*3/uL (ref 0.0–0.1)
Basophils Relative: 0 %
Eosinophils Absolute: 0.1 10*3/uL (ref 0.0–0.5)
Eosinophils Relative: 1 %
HCT: 47.7 % (ref 39.0–52.0)
Hemoglobin: 15.2 g/dL (ref 13.0–17.0)
Immature Granulocytes: 0 %
Lymphocytes Relative: 34 %
Lymphs Abs: 3 10*3/uL (ref 0.7–4.0)
MCH: 30.1 pg (ref 26.0–34.0)
MCHC: 31.9 g/dL (ref 30.0–36.0)
MCV: 94.5 fL (ref 80.0–100.0)
Monocytes Absolute: 0.7 10*3/uL (ref 0.1–1.0)
Monocytes Relative: 8 %
Neutro Abs: 5.1 10*3/uL (ref 1.7–7.7)
Neutrophils Relative %: 57 %
Platelets: 178 10*3/uL (ref 150–400)
RBC: 5.05 MIL/uL (ref 4.22–5.81)
RDW: 13.7 % (ref 11.5–15.5)
WBC: 9 10*3/uL (ref 4.0–10.5)
nRBC: 0 % (ref 0.0–0.2)

## 2023-04-23 LAB — PREALBUMIN: Prealbumin: 19 mg/dL (ref 18–38)

## 2023-04-23 MED ORDER — TAMSULOSIN HCL 0.4 MG PO CAPS
0.4000 mg | ORAL_CAPSULE | Freq: Every day | ORAL | Status: DC
  Administered 2023-04-23 – 2023-04-25 (×3): 0.4 mg via ORAL
  Filled 2023-04-23 (×3): qty 1

## 2023-04-23 MED ORDER — ASPIRIN 81 MG PO CHEW
81.0000 mg | CHEWABLE_TABLET | Freq: Every day | ORAL | Status: DC
  Administered 2023-04-23 – 2023-04-25 (×3): 81 mg via ORAL
  Filled 2023-04-23 (×3): qty 1

## 2023-04-23 MED ORDER — OXYCODONE HCL 5 MG PO TABS
5.0000 mg | ORAL_TABLET | ORAL | Status: DC | PRN
  Administered 2023-04-23 – 2023-04-25 (×4): 5 mg via ORAL
  Filled 2023-04-23 (×4): qty 1

## 2023-04-23 MED ORDER — CLOPIDOGREL BISULFATE 75 MG PO TABS
75.0000 mg | ORAL_TABLET | Freq: Every day | ORAL | Status: DC
  Administered 2023-04-23 – 2023-04-25 (×3): 75 mg via ORAL
  Filled 2023-04-23 (×3): qty 1

## 2023-04-23 MED ORDER — ATORVASTATIN CALCIUM 40 MG PO TABS
80.0000 mg | ORAL_TABLET | Freq: Every day | ORAL | Status: DC
  Administered 2023-04-23 – 2023-04-24 (×2): 80 mg via ORAL
  Filled 2023-04-23 (×2): qty 2

## 2023-04-23 MED ORDER — CALCIUM POLYCARBOPHIL 625 MG PO TABS
625.0000 mg | ORAL_TABLET | Freq: Two times a day (BID) | ORAL | Status: DC
  Administered 2023-04-23 – 2023-04-25 (×5): 625 mg via ORAL
  Filled 2023-04-23 (×5): qty 1

## 2023-04-23 MED ORDER — DULOXETINE HCL 30 MG PO CPEP
30.0000 mg | ORAL_CAPSULE | Freq: Every day | ORAL | Status: DC
  Administered 2023-04-23 – 2023-04-25 (×3): 30 mg via ORAL
  Filled 2023-04-23 (×3): qty 1

## 2023-04-23 MED ORDER — METOPROLOL SUCCINATE ER 50 MG PO TB24
50.0000 mg | ORAL_TABLET | Freq: Every day | ORAL | Status: DC
  Administered 2023-04-23 – 2023-04-24 (×2): 50 mg via ORAL
  Filled 2023-04-23 (×3): qty 1

## 2023-04-23 MED ORDER — RANOLAZINE ER 500 MG PO TB12
500.0000 mg | ORAL_TABLET | Freq: Two times a day (BID) | ORAL | Status: DC
  Administered 2023-04-23 – 2023-04-25 (×5): 500 mg via ORAL
  Filled 2023-04-23 (×5): qty 1

## 2023-04-23 MED ORDER — PANTOPRAZOLE SODIUM 40 MG PO TBEC
40.0000 mg | DELAYED_RELEASE_TABLET | Freq: Every day | ORAL | Status: DC
  Administered 2023-04-24 – 2023-04-25 (×2): 40 mg via ORAL
  Filled 2023-04-23 (×2): qty 1

## 2023-04-23 NOTE — Progress Notes (Signed)
   04/23/23 1109  TOC Brief Assessment  Insurance and Status Reviewed  Patient has primary care physician Yes  Home environment has been reviewed home w/ spouse  Prior level of function: independent  Prior/Current Home Services No current home services  Social Drivers of Health Review SDOH reviewed no interventions necessary  Readmission risk has been reviewed Yes  Transition of care needs no transition of care needs at this time

## 2023-04-23 NOTE — Progress Notes (Signed)
 PROGRESS NOTE  Paul Hardy HQI:[id number] DOB: 19-Dec-1960 DOA: 04/21/2023 PCP: Everrett Coombe, DO  HPI/Recap of past 24 hours: Paul Hardy is a 63 y.o. male with medical history significant for CAD s/p CABG, HTN, COPD being admitted to the hospital with concern for partial bowel obstruction versus ileus. Pt reported sudden onset of abdominal pain, watery diarrhea, and vomiting X 1 day. Pt presented to [facility], where CT abd/pelvis showed evidence of possible partial small bowel obstruction or ileus.  Of note, patient was recently started on p.o. cefpodoxime and steroids for COPD exacerbation (has not completed both antibiotics and steroid dose). Due to persistent N/V, NG tube was placed. Gen surgery consulted.  Patient admitted for further management.    Today, patient denies any new complaints, still having diarrhea although reports is improving.  Denies any further nausea/vomiting or abdominal pain.  Met patient eating his breakfast.   Assessment/Plan: Principal Problem:   Ileus (HCC) Active Problems:   Chronic kidney disease (CKD), stage III (moderate) (HCC)   OSA on CPAP   COPD (chronic obstructive pulmonary disease) (HCC)   SBO (small bowel obstruction) (HCC)   Hypokalemia   Enteritis   Partial SBO/ileus versus enteritis Presents with abdominal pain, vomiting, diarrhea S/p NG tube Currently afebrile with no leukocytosis CT abdomen/pelvis showed markedly distended stomach and mid small bowel loops with gradual transition to decompressed distal small bowel and terminal ileum C. difficile negative, GI panel pending General Surgery consulted, appreciate recs Pain management, antiemetics  COPD Currently saturating well on room air Currently appears stable, not in exacerbation Continue to hold home cefpodoxime and steroids that was started for COPD exacerbation for now Supplemental O2 as needed  Possible CKD stage II Baseline creatinine around 1.4 Daily BMP    CAD S/p CABG, currently chest pain-free Continue home Toprol-XL, Ranexa once able   BPH Flomax   GERD Protonix   Hyperlipidemia Lipitor    Estimated body mass index is 29.38 kg/m as calculated from the following:   Height as of this encounter: 6\' 1"  (1.854 m).   Weight as of this encounter: 101 kg.     Code Status: Full  Family Communication: None at bedside  Disposition Plan: Status is: Inpatient Remains inpatient appropriate because: Level of care      Consultants: General surgery  Procedures: None  Antimicrobials: None  DVT prophylaxis: Lovenox   Objective: Vitals:   04/22/23 2112 04/23/23 0416 04/23/23 0834 04/23/23 1412  BP: (!) 142/95 134/81 (!) 116/94 101/66  Pulse: 69 81 75 67  Resp: 20 18  20   Temp: 98.1 F (36.7 C) 98.4 F (36.9 C)  (!) 97.4 F (36.3 C)  TempSrc: Oral Oral  Oral  SpO2: 98% 97% 98% 95%  Weight:      Height:        Intake/Output Summary (Last 24 hours) at 04/23/2023 1528 Last data filed at 04/22/2023 1900 Gross per 24 hour  Intake 800 ml  Output 200 ml  Net 600 ml   Filed Weights   04/21/23 0551  Weight: 101 kg    Exam: General: NAD  Cardiovascular: S1, S2 present Respiratory: CTAB Abdomen: Soft, tender, nondistended, bowel sounds present Musculoskeletal: No bilateral pedal edema noted Skin: Normal Psychiatry: Normal mood     Data Reviewed: CBC: Recent Labs  Lab 04/21/23 0601 04/22/23 0800 04/23/23 0652  WBC 9.9 10.8* 9.0  NEUTROABS 8.5*  --  5.1  HGB 16.9 15.6 15.2  HCT 51.0 51.3 47.7  MCV 89.6 96.8 94.5  PLT 223 195 178   Basic Metabolic Panel: Recent Labs  Lab 04/21/23 0601 04/22/23 0800 04/22/23 0824 04/23/23 0652  NA 138 140  --  137  K 3.4* 4.0  --  3.9  CL 100 105  --  103  CO2 24 24  --  26  GLUCOSE 185* 123*  --  107*  BUN 26* 27*  --  22  CREATININE 1.48* 1.28*  --  1.45*  CALCIUM 8.9 8.3*  --  8.0*  MG  --   --  2.2  --   PHOS  --   --  2.4*  --    GFR: Estimated  Creatinine Clearance: 66 mL/min (A) (by C-G formula based on SCr of 1.45 mg/dL (H)). Liver Function Tests: Recent Labs  Lab 04/21/23 0601  AST 24  ALT 28  ALKPHOS 94  BILITOT 1.3*  PROT 7.4  ALBUMIN 3.8   No results for input(s): "LIPASE", "AMYLASE" in the last 168 hours. No results for input(s): "AMMONIA" in the last 168 hours. Coagulation Profile: No results for input(s): "INR", "PROTIME" in the last 168 hours. Cardiac Enzymes: No results for input(s): "CKTOTAL", "CKMB", "CKMBINDEX", "TROPONINI" in the last 168 hours. BNP (last 3 results) No results for input(s): "PROBNP" in the last 8760 hours. HbA1C: No results for input(s): "HGBA1C" in the last 72 hours. CBG: No results for input(s): "GLUCAP" in the last 168 hours. Lipid Profile: No results for input(s): "CHOL", "HDL", "LDLCALC", "TRIG", "CHOLHDL", "LDLDIRECT" in the last 72 hours. Thyroid Function Tests: No results for input(s): "TSH", "T4TOTAL", "FREET4", "T3FREE", "THYROIDAB" in the last 72 hours. Anemia Panel: No results for input(s): "VITAMINB12", "FOLATE", "FERRITIN", "TIBC", "IRON", "RETICCTPCT" in the last 72 hours. Urine analysis:    Component Value Date/Time   BILIRUBINUR small (A) 11/14/2022 1854   BILIRUBINUR negative 04/05/2022 0000   KETONESUR negative 11/14/2022 1854   PROTEINUR =30 (A) 11/14/2022 1854   PROTEINUR Negative 04/05/2022 0000   UROBILINOGEN 2.0 (A) 11/14/2022 1854   NITRITE Negative 11/14/2022 1854   NITRITE negative 04/05/2022 0000   LEUKOCYTESUR Negative 11/14/2022 1854   Sepsis Labs: @LABRCNTIP (procalcitonin:4,lacticidven:4)  ) Recent Results (from the past 240 hours)  Resp panel by RT-PCR (RSV, Flu A&B, Covid) Anterior Nasal Swab     Status: None   Collection Time: 04/21/23  6:01 AM   Specimen: Anterior Nasal Swab  Result Value Ref Range Status   SARS Coronavirus 2 by RT PCR NEGATIVE NEGATIVE Final    Comment: (NOTE) SARS-CoV-2 target nucleic acids are NOT DETECTED.  The  SARS-CoV-2 RNA is generally detectable in upper respiratory specimens during the acute phase of infection. The lowest concentration of SARS-CoV-2 viral copies this assay can detect is 138 copies/mL. A negative result does not preclude SARS-Cov-2 infection and should not be used as the sole basis for treatment or other patient management decisions. A negative result may occur with  improper specimen collection/handling, submission of specimen other than nasopharyngeal swab, presence of viral mutation(s) within the areas targeted by this assay, and inadequate number of viral copies(<138 copies/mL). A negative result must be combined with clinical observations, patient history, and epidemiological information. The expected result is Negative.  Fact Sheet for Patients:  BloggerCourse.com  Fact Sheet for Healthcare Providers:  SeriousBroker.it  This test is no t yet approved or cleared by the [place] FDA and  has been authorized for detection and/or diagnosis of SARS-CoV-2 by FDA under an Emergency Use Authorization (EUA). This EUA will remain  in effect (  meaning this test can be used) for the duration of the COVID-19 declaration under Section 564(b)(1) of the Act, 21 U.S.C.section 360bbb-3(b)(1), unless the authorization is terminated  or revoked sooner.       Influenza A by PCR NEGATIVE NEGATIVE Final   Influenza B by PCR NEGATIVE NEGATIVE Final    Comment: (NOTE) The Xpert Xpress SARS-CoV-2/FLU/RSV plus assay is intended as an aid in the diagnosis of influenza from Nasopharyngeal swab specimens and should not be used as a sole basis for treatment. Nasal washings and aspirates are unacceptable for Xpert Xpress SARS-CoV-2/FLU/RSV testing.  Fact Sheet for Patients: BloggerCourse.com  Fact Sheet for Healthcare Providers: SeriousBroker.it  This test is not yet approved or  cleared by the [place] FDA and has been authorized for detection and/or diagnosis of SARS-CoV-2 by FDA under an Emergency Use Authorization (EUA). This EUA will remain in effect (meaning this test can be used) for the duration of the COVID-19 declaration under Section 564(b)(1) of the Act, 21 U.S.C. section 360bbb-3(b)(1), unless the authorization is terminated or revoked.     Resp Syncytial Virus by PCR NEGATIVE NEGATIVE Final    Comment: (NOTE) Fact Sheet for Patients: BloggerCourse.com  Fact Sheet for Healthcare Providers: SeriousBroker.it  This test is not yet approved or cleared by the [place] FDA and has been authorized for detection and/or diagnosis of SARS-CoV-2 by FDA under an Emergency Use Authorization (EUA). This EUA will remain in effect (meaning this test can be used) for the duration of the COVID-19 declaration under Section 564(b)(1) of the Act, 21 U.S.C. section 360bbb-3(b)(1), unless the authorization is terminated or revoked.  Performed at [facility], [address] [place], [place] [address]   C Difficile Quick Screen w PCR reflex     Status: None   Collection Time: 04/22/23  3:05 PM   Specimen: STOOL  Result Value Ref Range Status   C Diff antigen NEGATIVE NEGATIVE Final   C Diff toxin NEGATIVE NEGATIVE Final   C Diff interpretation No C. difficile detected.  Final    Comment: Performed at [facility], [address], [place], [place] [address]      Studies: No results found.  Scheduled Meds:  aspirin  81 mg Oral Daily   atorvastatin  80 mg Oral QHS   clopidogrel  75 mg Oral Daily   DULoxetine  30 mg Oral Daily   enoxaparin (LOVENOX) injection  40 mg Subcutaneous Q24H   lactose free nutrition  237 mL Oral BID BM   metoprolol succinate  50 mg Oral Daily   [START ON 04/24/2023] pantoprazole  40 mg Oral Daily   polycarbophil  625 mg Oral BID   ranolazine   500 mg Oral BID   tamsulosin  0.4 mg Oral Daily    Continuous Infusions:  lactated ringers       LOS: 2 days     Briant Cedar, MD [facility]  If 7PM-7AM, please contact night-coverage [email or web address] 04/23/2023, 3:28 PM

## 2023-04-23 NOTE — Progress Notes (Signed)
 04/23/2023  Paul Hardy [id number] 19-Oct-1960  CARE TEAM: PCP: Everrett Coombe, DO  Outpatient Care Team: Patient Care Team: Everrett Coombe, DO as PCP - General (Family Medicine) Means, Greig Castilla, MD as Referring Physician (Cardiology) Carlena Bjornstad, MD (Cardiology)  Inpatient Treatment Team: Treatment Team:  Briant Cedar, MD [facility], Md, MD Alton Revere, MD Molinda Bailiff, RN Lynden Ang, [facility] Fenton Malling, NT Phineas Semen, RN Clydia Llano, RN Clydia Llano, RN Laural Benes, [place] M   Problem List:   Principal Problem:   Ileus [facility]) Active Problems:   Chronic kidney disease (CKD), stage III (moderate) (HCC)   OSA on CPAP   COPD (chronic obstructive pulmonary disease) (HCC)   SBO (small bowel obstruction) (HCC)   Hypokalemia   Enteritis   * No surgery found *      Assessment [facility] Stay = 2 days)      Probable enteritis or ileus    Plan:  Tolerate NG tube clamping trial and tolerating full liquids.  Advance to solid diet.  Soluble fiber regimen with FiberCon twice daily.  Defer diarrhea workup to main medicine service.  No history of C. difficile but I guess GI panel waning.  Do not know if he really needs to be on contact precautions but will defer to primary service.  He has not had prior surgery and has no evidence of mass tumor or hernia to explain why he may have some obstruction.  It is a subtle call so I suspect this is not truly mechanical adhesions but just enteritis/ileus  Follow electrolytes.  Keep potassium above 4, magnesium above 2, phosphorus above 3.    Nonoliguric.  Seems to be hovering around his usual chronic kidney disease baseline creatinine 1.2-1.4 defer to medicine  -VTE prophylaxis- SCDs, etc  -mobilize as tolerated to help recovery       I reviewed nursing notes, ED provider notes, hospitalist notes, last 24 h vitals and pain scores, last 48 h intake and output, last 24 h labs and trends,  and last 24 h imaging results.  I have reviewed this patient's available data, including medical history, events of note, test results, etc as part of my evaluation.   A significant portion of that time was spent in counseling. Care during the described time interval was provided by me.  This care required moderate level of medical decision making.  04/23/2023    Subjective: (Chief complaint)  Tolerate NG tube clamping trial.  Tolerating thicker liquids.  Still having diarrhea.  Not as bad but not normal.  No more nausea or vomiting.  Objective:  Vital signs:  Vitals:   04/22/23 0546 04/22/23 1322 04/22/23 2112 04/23/23 0416  BP: (!) 136/97 136/81 (!) 142/95 134/81  Pulse: 72 68 69 81  Resp: 20 18 20 18   Temp:  97.8 F (36.6 C) 98.1 F (36.7 C) 98.4 F (36.9 C)  TempSrc:  Oral Oral Oral  SpO2: 98% 99% 98% 97%  Weight:      Height:        Last BM Date : 04/22/23  Intake/Output   Yesterday:  03/01 0701 - 03/02 0700 In: 860 [P.O.:600; NG/GT:60; IV Piggyback:200] Out: 550 [Urine:350; Emesis/NG output:200] This shift:  No intake/output data recorded.  Bowel function:  Flatus: YES  BM:  YES  Drain: No drain  Physical Exam:  General: Pt awake/alert in no acute distress Eyes: PERRL, normal EOM.  Sclera clear.  No icterus Neuro: CN II-XII intact w/o focal sensory/motor  deficits. Lymph: No head/neck/groin lymphadenopathy Psych:  No delerium/psychosis/paranoia.  Oriented x 4 HENT: Normocephalic, Mucus membranes moist.  No thrush Neck: Supple, No tracheal deviation.  No obvious thyromegaly Chest: No pain to chest wall compression.  Good respiratory excursion.  No audible wheezing CV:  Pulses intact.  Regular rhythm.  No major extremity edema MS: Normal AROM mjr joints.  No obvious deformity  Abdomen: Soft.  Mildy distended.  Obese Nontender.  No umbilical or inguinal hernias.  No incisions.  No guarding.  No evidence of peritonitis.  No incarcerated  hernias.  Ext:   No deformity.  No mjr edema.  No cyanosis Skin: No petechiae / purpurea.  No major sores.  Warm and dry    Results:   Cultures: Recent Results (from the past 720 hours)  Resp panel by RT-PCR (RSV, Flu A&B, Covid) Anterior Nasal Swab     Status: None   Collection Time: 04/21/23  6:01 AM   Specimen: Anterior Nasal Swab  Result Value Ref Range Status   SARS Coronavirus 2 by RT PCR NEGATIVE NEGATIVE Final    Comment: (NOTE) SARS-CoV-2 target nucleic acids are NOT DETECTED.  The SARS-CoV-2 RNA is generally detectable in upper respiratory specimens during the acute phase of infection. The lowest concentration of SARS-CoV-2 viral copies this assay can detect is 138 copies/mL. A negative result does not preclude SARS-Cov-2 infection and should not be used as the sole basis for treatment or other patient management decisions. A negative result may occur with  improper specimen collection/handling, submission of specimen other than nasopharyngeal swab, presence of viral mutation(s) within the areas targeted by this assay, and inadequate number of viral copies(<138 copies/mL). A negative result must be combined with clinical observations, patient history, and epidemiological information. The expected result is Negative.  Fact Sheet for Patients:  BloggerCourse.com  Fact Sheet for Healthcare Providers:  SeriousBroker.it  This test is no t yet approved or cleared by the [place] FDA and  has been authorized for detection and/or diagnosis of SARS-CoV-2 by FDA under an Emergency Use Authorization (EUA). This EUA will remain  in effect (meaning this test can be used) for the duration of the COVID-19 declaration under Section 564(b)(1) of the Act, 21 U.S.C.section 360bbb-3(b)(1), unless the authorization is terminated  or revoked sooner.       Influenza A by PCR NEGATIVE NEGATIVE Final   Influenza B by PCR NEGATIVE  NEGATIVE Final    Comment: (NOTE) The Xpert Xpress SARS-CoV-2/FLU/RSV plus assay is intended as an aid in the diagnosis of influenza from Nasopharyngeal swab specimens and should not be used as a sole basis for treatment. Nasal washings and aspirates are unacceptable for Xpert Xpress SARS-CoV-2/FLU/RSV testing.  Fact Sheet for Patients: BloggerCourse.com  Fact Sheet for Healthcare Providers: SeriousBroker.it  This test is not yet approved or cleared by the [place] FDA and has been authorized for detection and/or diagnosis of SARS-CoV-2 by FDA under an Emergency Use Authorization (EUA). This EUA will remain in effect (meaning this test can be used) for the duration of the COVID-19 declaration under Section 564(b)(1) of the Act, 21 U.S.C. section 360bbb-3(b)(1), unless the authorization is terminated or revoked.     Resp Syncytial Virus by PCR NEGATIVE NEGATIVE Final    Comment: (NOTE) Fact Sheet for Patients: BloggerCourse.com  Fact Sheet for Healthcare Providers: SeriousBroker.it  This test is not yet approved or cleared by the [place] FDA and has been authorized for detection and/or diagnosis of SARS-CoV-2 by FDA  under an Emergency Use Authorization (EUA). This EUA will remain in effect (meaning this test can be used) for the duration of the COVID-19 declaration under Section 564(b)(1) of the Act, 21 U.S.C. section 360bbb-3(b)(1), unless the authorization is terminated or revoked.  Performed at [facility], [address] [place], [place] [address]   C Difficile Quick Screen w PCR reflex     Status: None   Collection Time: 04/22/23  3:05 PM   Specimen: STOOL  Result Value Ref Range Status   C Diff antigen NEGATIVE NEGATIVE Final   C Diff toxin NEGATIVE NEGATIVE Final   C Diff interpretation No C. difficile detected.  Final    Comment: Performed  at [facility], [address] [place], [place] [address]    Labs: Results for orders placed or performed during the hospital encounter of 04/21/23 (from the past 48 hours)  Basic metabolic panel     Status: Abnormal   Collection Time: 04/22/23  8:00 AM  Result Value Ref Range   Sodium 140 135 - 145 mmol/L   Potassium 4.0 3.5 - 5.1 mmol/L   Chloride 105 98 - 111 mmol/L   CO2 24 22 - 32 mmol/L   Glucose, Bld 123 (H) 70 - 99 mg/dL    Comment: Glucose reference range applies only to samples taken after fasting for at least 8 hours.   BUN 27 (H) 8 - 23 mg/dL   Creatinine, Ser 0.98 (H) 0.61 - 1.24 mg/dL   Calcium 8.3 (L) 8.9 - 10.3 mg/dL   GFR, Estimated >11 >91 mL/min    Comment: (NOTE) Calculated using the CKD-EPI Creatinine Equation (2021)    Anion gap 11 5 - 15    Comment: Performed at [facility], [address] [place], [place] [address]  CBC     Status: Abnormal   Collection Time: 04/22/23  8:00 AM  Result Value Ref Range   WBC 10.8 (H) 4.0 - 10.5 K/uL   RBC 5.30 4.22 - 5.81 MIL/uL   Hemoglobin 15.6 13.0 - 17.0 g/dL   HCT 56.2 13.0 - 86.5 %   MCV 96.8 80.0 - 100.0 fL   MCH 29.4 26.0 - 34.0 pg   MCHC 30.4 30.0 - 36.0 g/dL   RDW 78.4 69.6 - 29.5 %   Platelets 195 150 - 400 K/uL   nRBC 0.0 0.0 - 0.2 %    Comment: Performed at [facility], [address] [place], [place] [address]  HIV Antibody (routine testing w rflx)     Status: None   Collection Time: 04/22/23  8:00 AM  Result Value Ref Range   HIV Screen 4th Generation wRfx Non Reactive Non Reactive    Comment: Performed at [facility], [address] [place], [place] [address]  Prealbumin     Status: None   Collection Time: 04/22/23  8:00 AM  Result Value Ref Range   Prealbumin 19 18 - 38 mg/dL    Comment: Performed at [facility], [address], [place], [place] [address]  Magnesium     Status: None   Collection Time: 04/22/23  8:24 AM   Result Value Ref Range   Magnesium 2.2 1.7 - 2.4 mg/dL    Comment: Performed at [facility] [address], [place], [place] [address]  Phosphorus     Status: Abnormal   Collection Time: 04/22/23  8:24 AM  Result Value Ref Range  Phosphorus 2.4 (L) 2.5 - 4.6 mg/dL    Comment: Performed at [facility], [address] [place], [place] [address]  C Difficile Quick Screen w PCR reflex     Status: None   Collection Time: 04/22/23  3:05 PM   Specimen: STOOL  Result Value Ref Range   C Diff antigen NEGATIVE NEGATIVE   C Diff toxin NEGATIVE NEGATIVE   C Diff interpretation No C. difficile detected.     Comment: Performed at [facility], [address] [place], [place] [address]  Basic metabolic panel     Status: Abnormal   Collection Time: 04/23/23  6:52 AM  Result Value Ref Range   Sodium 137 135 - 145 mmol/L   Potassium 3.9 3.5 - 5.1 mmol/L   Chloride 103 98 - 111 mmol/L   CO2 26 22 - 32 mmol/L   Glucose, Bld 107 (H) 70 - 99 mg/dL    Comment: Glucose reference range applies only to samples taken after fasting for at least 8 hours.   BUN 22 8 - 23 mg/dL   Creatinine, Ser 2.02 (H) 0.61 - 1.24 mg/dL   Calcium 8.0 (L) 8.9 - 10.3 mg/dL   GFR, Estimated 54 (L) >60 mL/min    Comment: (NOTE) Calculated using the CKD-EPI Creatinine Equation (2021)    Anion gap 8 5 - 15    Comment: Performed at [facility], [address], [place], [place] [address]  CBC with Differential/Platelet     Status: None   Collection Time: 04/23/23  6:52 AM  Result Value Ref Range   WBC 9.0 4.0 - 10.5 K/uL   RBC 5.05 4.22 - 5.81 MIL/uL   Hemoglobin 15.2 13.0 - 17.0 g/dL   HCT 62.3 76.2 - 83.1 %   MCV 94.5 80.0 - 100.0 fL   MCH 30.1 26.0 - 34.0 pg   MCHC 31.9 30.0 - 36.0 g/dL   RDW 51.7 61.6 - 07.3 %   Platelets 178 150 - 400 K/uL   nRBC 0.0 0.0 - 0.2 %   Neutrophils Relative % 57 %   Neutro Abs 5.1 1.7 - 7.7 K/uL    Lymphocytes Relative 34 %   Lymphs Abs 3.0 0.7 - 4.0 K/uL   Monocytes Relative 8 %   Monocytes Absolute 0.7 0.1 - 1.0 K/uL   Eosinophils Relative 1 %   Eosinophils Absolute 0.1 0.0 - 0.5 K/uL   Basophils Relative 0 %   Basophils Absolute 0.0 0.0 - 0.1 K/uL   Immature Granulocytes 0 %   Abs Immature Granulocytes 0.03 0.00 - 0.07 K/uL    Comment: Performed at [facility], [address] [place], [place] [address]    Imaging / Studies: DG Abdomen 1 View Result Date: 04/21/2023 CLINICAL DATA:  NG tube placement EXAM: ABDOMEN - 1 VIEW limited x-ray for tube placement COMPARISON:  CT 04/21/2023 FINDINGS: Limited x-ray of the upper abdomen demonstrates enteric tube with tip overlying the midbody of the stomach. There are dilated loops of small bowel in the upper mid abdomen. Few nondilated loops of colon. IMPRESSION: Limited x-ray of the upper abdomen for tube placement has tube overlying the midbody of the stomach. Electronically Signed   By: Karen Kays M.D.   On: 04/21/2023 15:27   CT ABDOMEN PELVIS WO CONTRAST Result Date: 04/21/2023 CLINICAL DATA:  Abdominal pain, acute, nonlocalized EXAM: CT ABDOMEN AND PELVIS WITHOUT CONTRAST TECHNIQUE: Multidetector CT imaging of the abdomen and pelvis was  performed following the standard protocol without IV contrast. RADIATION DOSE REDUCTION: This exam was performed according to the departmental dose-optimization program which includes automated exposure control, adjustment of the mA and/or kV according to patient size and/or use of iterative reconstruction technique. COMPARISON:  MR 04/09/2022 FINDINGS: Lower chest: No pleural or pericardial effusion. Coronary calcifications. Subsegmental scarring or atelectasis in the lung bases. Hepatobiliary: No focal liver abnormality is seen. Status post cholecystectomy. No biliary dilatation. Pancreas: Unremarkable. No pancreatic ductal dilatation or surrounding inflammatory changes. Spleen: Normal in  size without focal abnormality. Adrenals/Urinary Tract: No adrenal mass. Peripheral left renal calcifications largest 10 mm mid pole. No hydronephrosis or ureterectasis. Stable small bilateral renal cysts. Urinary bladder physiologically distended. Stomach/Bowel: Stomach is markedly distended. Duodenum decompressed. Multiple dilated mid small bowel loops, without evident wall thickening or pneumatosis. Gradual transition to decompressed distal small bowel and terminal ileum. Normal appendix. The colon is incompletely distended, with scattered diverticula in ascending, transverse, and descending segments without adjacent inflammatory change. Vascular/Lymphatic: Mild calcified aortoiliac plaque without aneurysm. No abdominal or pelvic adenopathy. Reproductive: Prostate enlargement with central coarse calcifications. Other: No ascites.  No free air. Musculoskeletal: Sternotomy wires. IMPRESSION: 1. Markedly distended stomach and mid small bowel loops, with gradual transition to decompressed distal small bowel and terminal ileum. Findings may reflect ileus or partial small bowel obstruction. 2. Colonic diverticulosis. 3. Nonobstructive left nephrolithiasis. 4.  Aortic Atherosclerosis (ICD10-I70.0). Electronically Signed   By: Corlis Leak M.D.   On: 04/21/2023 11:04    Medications / Allergies: per chart  Antibiotics: Anti-infectives (From admission, onward)    Start     Dose/Rate Route Frequency Ordered Stop   04/22/23 0800  cefUROXime (CEFTIN) tablet 500 mg  Status:  Discontinued        500 mg Oral 2 times daily with meals 04/21/23 1722 04/22/23 0815         Note: Portions of this report may have been transcribed using voice recognition software. Every effort was made to ensure accuracy; however, inadvertent computerized transcription errors may be present.   Any transcriptional errors that result from this process are unintentional.    Ardeth Sportsman, MD, FACS, MASCRS Esophageal, Gastrointestinal  & Colorectal Surgery Robotic and Minimally Invasive Surgery  [facility] [address], [address] [place], [place] [address] [phone] Fax [phone] Main  CONTACT INFORMATION: Weekday (9AM-5PM): Call [facility] main office at [phone] Weeknight (5PM-9AM) or Weekend/Holiday: Check EPIC "Web Links" tab & use "AMION" (password " [email or web address]") for General Surgery [facility] coverage  Please, DO NOT use SecureChat  (it is not reliable communication to reach operating surgeons & will lead to a delay in care).   Epic staff messaging available for outptient concerns needing 1-2 business day response.      04/23/2023  7:40 AM

## 2023-04-24 ENCOUNTER — Telehealth: Payer: Self-pay | Admitting: Family Medicine

## 2023-04-24 ENCOUNTER — Telehealth: Payer: Self-pay | Admitting: Medical-Surgical

## 2023-04-24 DIAGNOSIS — K567 Ileus, unspecified: Secondary | ICD-10-CM | POA: Diagnosis not present

## 2023-04-24 LAB — BASIC METABOLIC PANEL
Anion gap: 7 (ref 5–15)
BUN: 18 mg/dL (ref 8–23)
CO2: 28 mmol/L (ref 22–32)
Calcium: 7.9 mg/dL — ABNORMAL LOW (ref 8.9–10.3)
Chloride: 101 mmol/L (ref 98–111)
Creatinine, Ser: 1.4 mg/dL — ABNORMAL HIGH (ref 0.61–1.24)
GFR, Estimated: 57 mL/min — ABNORMAL LOW (ref >60)
Glucose, Bld: 105 mg/dL — ABNORMAL HIGH (ref 70–99)
Potassium: 3.5 mmol/L (ref 3.5–5.1)
Sodium: 136 mmol/L (ref 135–145)

## 2023-04-24 NOTE — Progress Notes (Signed)
 PROGRESS NOTE  Paul Hardy ZOX:[id number] DOB: 08/22/1960 DOA: 04/21/2023 PCP: Everrett Coombe, DO  HPI/Recap of past 24 hours: Paul Hardy is a 63 y.o. male with medical history significant for CAD s/p CABG, HTN, COPD being admitted to the hospital with concern for partial bowel obstruction versus ileus. Pt reported sudden onset of abdominal pain, watery diarrhea, and vomiting X 1 day. Pt presented to [facility], where CT abd/pelvis showed evidence of possible partial small bowel obstruction or ileus.  Of note, patient was recently started on p.o. cefpodoxime and steroids for COPD exacerbation (has not completed both antibiotics and steroid dose). Due to persistent N/V, NG tube was placed. Gen surgery consulted.  Patient admitted for further management.    Today, patient reports improving nausea/vomiting, and abdominal pain.  Still with some diarrhea.  Tolerating diet.   Assessment/Plan: Principal Problem:   Ileus (HCC) Active Problems:   Chronic kidney disease (CKD), stage III (moderate) (HCC)   OSA on CPAP   COPD (chronic obstructive pulmonary disease) (HCC)   SBO (small bowel obstruction) (HCC)   Hypokalemia   Enteritis   Norovirus gastroenteritis Presents with abdominal pain, vomiting, diarrhea S/p NG tube Currently afebrile with no leukocytosis CT abdomen/pelvis showed markedly distended stomach and mid small bowel loops with gradual transition to decompressed distal small bowel and terminal ileum C. difficile negative, GI panel noted positive for norovirus General Surgery consulted, appreciate recs Pain management, antiemetics  COPD Currently saturating well on room air Currently appears stable, not in exacerbation Continue to hold home cefpodoxime and steroids that was started for ??COPD exacerbation for now Supplemental O2 as needed  Possible CKD stage II Baseline creatinine around 1.4 Daily BMP   CAD S/p CABG, currently chest pain-free Continue home  Toprol-XL, Ranexa once able   BPH Flomax   GERD Protonix   Hyperlipidemia Lipitor    Estimated body mass index is 29.38 kg/m as calculated from the following:   Height as of this encounter: 6\' 1"  (1.854 m).   Weight as of this encounter: 101 kg.     Code Status: Full  Family Communication: Wife at bedside  Disposition Plan: Status is: Inpatient Remains inpatient appropriate because: Level of care      Consultants: General surgery  Procedures: None  Antimicrobials: None  DVT prophylaxis: Lovenox   Objective: Vitals:   04/23/23 2318 04/24/23 0521 04/24/23 0949 04/24/23 1337  BP: 118/84 (!) 93/57 (!) 118/53 107/69  Pulse: 71 (!) 55 72 61  Resp: 20 20 18 20   Temp: 98.2 F (36.8 C) 97.9 F (36.6 C)  97.7 F (36.5 C)  TempSrc: Oral Oral  Oral  SpO2: 96% (!) 89% 97% 92%  Weight:      Height:        Intake/Output Summary (Last 24 hours) at 04/24/2023 1622 Last data filed at 04/24/2023 0407 Gross per 24 hour  Intake 240 ml  Output --  Net 240 ml   Filed Weights   04/21/23 0551  Weight: 101 kg    Exam: General: NAD  Cardiovascular: S1, S2 present Respiratory: CTAB Abdomen: Soft, tender, nondistended, bowel sounds present Musculoskeletal: No bilateral pedal edema noted Skin: Normal Psychiatry: Normal mood     Data Reviewed: CBC: Recent Labs  Lab 04/21/23 0601 04/22/23 0800 04/23/23 0652  WBC 9.9 10.8* 9.0  NEUTROABS 8.5*  --  5.1  HGB 16.9 15.6 15.2  HCT 51.0 51.3 47.7  MCV 89.6 96.8 94.5  PLT 223 195 178   Basic Metabolic Panel:  Recent Labs  Lab 04/21/23 0601 04/22/23 0800 04/22/23 0824 04/23/23 0652 04/24/23 0721  NA 138 140  --  137 136  K 3.4* 4.0  --  3.9 3.5  CL 100 105  --  103 101  CO2 24 24  --  26 28  GLUCOSE 185* 123*  --  107* 105*  BUN 26* 27*  --  22 18  CREATININE 1.48* 1.28*  --  1.45* 1.40*  CALCIUM 8.9 8.3*  --  8.0* 7.9*  MG  --   --  2.2  --   --   PHOS  --   --  2.4*  --   --    GFR: Estimated  Creatinine Clearance: 68.3 mL/min (A) (by C-G formula based on SCr of 1.4 mg/dL (H)). Liver Function Tests: Recent Labs  Lab 04/21/23 0601  AST 24  ALT 28  ALKPHOS 94  BILITOT 1.3*  PROT 7.4  ALBUMIN 3.8   No results for input(s): "LIPASE", "AMYLASE" in the last 168 hours. No results for input(s): "AMMONIA" in the last 168 hours. Coagulation Profile: No results for input(s): "INR", "PROTIME" in the last 168 hours. Cardiac Enzymes: No results for input(s): "CKTOTAL", "CKMB", "CKMBINDEX", "TROPONINI" in the last 168 hours. BNP (last 3 results) No results for input(s): "PROBNP" in the last 8760 hours. HbA1C: No results for input(s): "HGBA1C" in the last 72 hours. CBG: No results for input(s): "GLUCAP" in the last 168 hours. Lipid Profile: No results for input(s): "CHOL", "HDL", "LDLCALC", "TRIG", "CHOLHDL", "LDLDIRECT" in the last 72 hours. Thyroid Function Tests: No results for input(s): "TSH", "T4TOTAL", "FREET4", "T3FREE", "THYROIDAB" in the last 72 hours. Anemia Panel: No results for input(s): "VITAMINB12", "FOLATE", "FERRITIN", "TIBC", "IRON", "RETICCTPCT" in the last 72 hours. Urine analysis:    Component Value Date/Time   BILIRUBINUR small (A) 11/14/2022 1854   BILIRUBINUR negative 04/05/2022 0000   KETONESUR negative 11/14/2022 1854   PROTEINUR =30 (A) 11/14/2022 1854   PROTEINUR Negative 04/05/2022 0000   UROBILINOGEN 2.0 (A) 11/14/2022 1854   NITRITE Negative 11/14/2022 1854   NITRITE negative 04/05/2022 0000   LEUKOCYTESUR Negative 11/14/2022 1854   Sepsis Labs: @LABRCNTIP (procalcitonin:4,lacticidven:4)  ) Recent Results (from the past 240 hours)  Resp panel by RT-PCR (RSV, Flu A&B, Covid) Anterior Nasal Swab     Status: None   Collection Time: 04/21/23  6:01 AM   Specimen: Anterior Nasal Swab  Result Value Ref Range Status   SARS Coronavirus 2 by RT PCR NEGATIVE NEGATIVE Final    Comment: (NOTE) SARS-CoV-2 target nucleic acids are NOT DETECTED.  The  SARS-CoV-2 RNA is generally detectable in upper respiratory specimens during the acute phase of infection. The lowest concentration of SARS-CoV-2 viral copies this assay can detect is 138 copies/mL. A negative result does not preclude SARS-Cov-2 infection and should not be used as the sole basis for treatment or other patient management decisions. A negative result may occur with  improper specimen collection/handling, submission of specimen other than nasopharyngeal swab, presence of viral mutation(s) within the areas targeted by this assay, and inadequate number of viral copies(<138 copies/mL). A negative result must be combined with clinical observations, patient history, and epidemiological information. The expected result is Negative.  Fact Sheet for Patients:  BloggerCourse.com  Fact Sheet for Healthcare Providers:  SeriousBroker.it  This test is no t yet approved or cleared by the [place] FDA and  has been authorized for detection and/or diagnosis of SARS-CoV-2 by FDA under an Emergency Use Authorization (EUA).  This EUA will remain  in effect (meaning this test can be used) for the duration of the COVID-19 declaration under Section 564(b)(1) of the Act, 21 U.S.C.section 360bbb-3(b)(1), unless the authorization is terminated  or revoked sooner.       Influenza A by PCR NEGATIVE NEGATIVE Final   Influenza B by PCR NEGATIVE NEGATIVE Final    Comment: (NOTE) The Xpert Xpress SARS-CoV-2/FLU/RSV plus assay is intended as an aid in the diagnosis of influenza from Nasopharyngeal swab specimens and should not be used as a sole basis for treatment. Nasal washings and aspirates are unacceptable for Xpert Xpress SARS-CoV-2/FLU/RSV testing.  Fact Sheet for Patients: BloggerCourse.com  Fact Sheet for Healthcare Providers: SeriousBroker.it  This test is not yet approved or  cleared by the [place] FDA and has been authorized for detection and/or diagnosis of SARS-CoV-2 by FDA under an Emergency Use Authorization (EUA). This EUA will remain in effect (meaning this test can be used) for the duration of the COVID-19 declaration under Section 564(b)(1) of the Act, 21 U.S.C. section 360bbb-3(b)(1), unless the authorization is terminated or revoked.     Resp Syncytial Virus by PCR NEGATIVE NEGATIVE Final    Comment: (NOTE) Fact Sheet for Patients: BloggerCourse.com  Fact Sheet for Healthcare Providers: SeriousBroker.it  This test is not yet approved or cleared by the [place] FDA and has been authorized for detection and/or diagnosis of SARS-CoV-2 by FDA under an Emergency Use Authorization (EUA). This EUA will remain in effect (meaning this test can be used) for the duration of the COVID-19 declaration under Section 564(b)(1) of the Act, 21 U.S.C. section 360bbb-3(b)(1), unless the authorization is terminated or revoked.  Performed at [facility], [address] [place], [place] [address]   C Difficile Quick Screen w PCR reflex     Status: None   Collection Time: 04/22/23  3:05 PM   Specimen: STOOL  Result Value Ref Range Status   C Diff antigen NEGATIVE NEGATIVE Final   C Diff toxin NEGATIVE NEGATIVE Final   C Diff interpretation No C. difficile detected.  Final    Comment: Performed at [facility], [address] [place], [place] [address]  Gastrointestinal Panel by PCR , Stool     Status: Abnormal   Collection Time: 04/22/23  3:05 PM   Specimen: STOOL  Result Value Ref Range Status   Campylobacter species NOT DETECTED NOT DETECTED Final   Plesimonas shigelloides NOT DETECTED NOT DETECTED Final   Salmonella species NOT DETECTED NOT DETECTED Final   Yersinia enterocolitica NOT DETECTED NOT DETECTED Final   Vibrio species NOT DETECTED NOT DETECTED Final    Vibrio cholerae NOT DETECTED NOT DETECTED Final   Enteroaggregative E coli (EAEC) NOT DETECTED NOT DETECTED Final   Enteropathogenic E coli (EPEC) NOT DETECTED NOT DETECTED Final   Enterotoxigenic E coli (ETEC) NOT DETECTED NOT DETECTED Final   Shiga like toxin producing E coli (STEC) NOT DETECTED NOT DETECTED Final   Shigella/Enteroinvasive E coli (EIEC) NOT DETECTED NOT DETECTED Final   Cryptosporidium NOT DETECTED NOT DETECTED Final   Cyclospora cayetanensis NOT DETECTED NOT DETECTED Final   Entamoeba histolytica NOT DETECTED NOT DETECTED Final   Giardia lamblia NOT DETECTED NOT DETECTED Final   Adenovirus F40/41 NOT DETECTED NOT DETECTED Final   Astrovirus NOT DETECTED NOT DETECTED Final   Norovirus GI/GII DETECTED (A) NOT DETECTED Final    Comment: RESULT CALLED TO, READ BACK BY AND VERIFIED WITH: CHELSEA BROCK AT 1629  04/23/23.PMF    Rotavirus A NOT DETECTED NOT DETECTED Final   Sapovirus (I, II, IV, and V) NOT DETECTED NOT DETECTED Final    Comment: Performed at [facility], [address] [place], [place] [address]      Studies: No results found.  Scheduled Meds:  aspirin  81 mg Oral Daily   atorvastatin  80 mg Oral QHS   clopidogrel  75 mg Oral Daily   DULoxetine  30 mg Oral Daily   enoxaparin (LOVENOX) injection  40 mg Subcutaneous Q24H   lactose free nutrition  237 mL Oral BID BM   metoprolol succinate  50 mg Oral Daily   pantoprazole  40 mg Oral Daily   polycarbophil  625 mg Oral BID   ranolazine  500 mg Oral BID   tamsulosin  0.4 mg Oral Daily    Continuous Infusions:     LOS: 3 days     Briant Cedar, MD [facility]  If 7PM-7AM, please contact night-coverage [email or web address] 04/24/2023, 4:22 PM

## 2023-04-24 NOTE — Progress Notes (Signed)
 [facility] Progress Note     Subjective: CC:  Abdominal discomfort improving. Reported some bloating last night that improved with time/maalox. Tolerating PO without vomiting. Reports flatus and loose,semi-solid brown stools   Objective: Vital signs in last 24 hours: Temp:  [97.4 F (36.3 C)-98.2 F (36.8 C)] 97.9 F (36.6 C) (03/03 0521) Pulse Rate:  [55-72] 72 (03/03 0949) Resp:  [18-20] 18 (03/03 0949) BP: (93-118)/(53-84) 118/53 (03/03 0949) SpO2:  [89 %-97 %] 97 % (03/03 0949) Last BM Date : 04/23/23  Intake/Output from previous day: 03/02 0701 - 03/03 0700 In: 240 [P.O.:240] Out: -  Intake/Output this shift: No intake/output data recorded.  PE: Gen:  Alert, NAD, pleasant Card:  Regular rate and rhythm Pulm:  Normal effort on nasal cannula Abd: Soft, non-tender, mild distention, bowel sounds present in all 4 quadrants Skin: warm and dry, no rashes  Psych: A&Ox3   Lab Results:  Recent Labs    04/22/23 0800 04/23/23 0652  WBC 10.8* 9.0  HGB 15.6 15.2  HCT 51.3 47.7  PLT 195 178   BMET Recent Labs    04/23/23 0652 04/24/23 0721  NA 137 136  K 3.9 3.5  CL 103 101  CO2 26 28  GLUCOSE 107* 105*  BUN 22 18  CREATININE 1.45* 1.40*  CALCIUM 8.0* 7.9*   PT/INR No results for input(s): "LABPROT", "INR" in the last 72 hours. CMP     Component Value Date/Time   NA 136 04/24/2023 0721   NA 141 11/14/2022 1836   K 3.5 04/24/2023 0721   CL 101 04/24/2023 0721   CO2 28 04/24/2023 0721   GLUCOSE 105 (H) 04/24/2023 0721   BUN 18 04/24/2023 0721   BUN 16 11/14/2022 1836   CREATININE 1.40 (H) 04/24/2023 0721   CREATININE 1.46 (H) 08/12/2022 1005   CALCIUM 7.9 (L) 04/24/2023 0721   PROT 7.4 04/21/2023 0601   PROT 7.0 11/14/2022 1836   ALBUMIN 3.8 04/21/2023 0601   ALBUMIN 4.6 11/14/2022 1836   AST 24 04/21/2023 0601   ALT 28 04/21/2023 0601   ALKPHOS 94 04/21/2023 0601   BILITOT 1.3 (H) 04/21/2023 0601   BILITOT 1.0 11/14/2022 1836    GFRNONAA 57 (L) 04/24/2023 0721   Lipase  No results found for: "LIPASE"     Studies/Results: No results found.  Anti-infectives: Anti-infectives (From admission, onward)    Start     Dose/Rate Route Frequency Ordered Stop   04/22/23 0800  cefUROXime (CEFTIN) tablet 500 mg  Status:  Discontinued        500 mg Oral 2 times daily with meals 04/21/23 1722 04/22/23 0815        Assessment/Plan  N/V/D, abdominal pain - enteritis vs pSBO Improving No evidence of obstruction. Tolerating a solid diet and having bowel function.  CCS will sign off, call as needed.      LOS: 3 days   I reviewed nursing notes, hospitalist notes, last 24 h vitals and pain scores, last 48 h intake and output, last 24 h labs and trends, and last 24 h imaging results.  This care required straight-forward level of medical decision making.   Hosie Spangle, PA-C [facility] Please see Amion for pager number during day hours 7:00am-4:30pm

## 2023-04-24 NOTE — Telephone Encounter (Signed)
 Pt's wife dropped off FMLA paperwork for her employer. Pt is being released from hospital  and will need her to care for him. She stated that she has to turn form into employer within 8 days. She was advised of the fee of 29.00.

## 2023-04-24 NOTE — Telephone Encounter (Signed)
 Copied from CRM [id number]. Topic: General - Other >> Apr 24, 2023 10:13 AM Marlow Baars wrote: Reason for CRM: The spouse of the patient is going to be bringing by a paper today that needs to be filled out and signed by the provider since the patient has been hospitalized with the [place] virus. She needs to have this to be out with him and she put it for 10 working days from Friday 2/28.

## 2023-04-25 DIAGNOSIS — K567 Ileus, unspecified: Secondary | ICD-10-CM | POA: Diagnosis not present

## 2023-04-25 LAB — BASIC METABOLIC PANEL
Anion gap: 7 (ref 5–15)
BUN: 17 mg/dL (ref 8–23)
CO2: 29 mmol/L (ref 22–32)
Calcium: 7.7 mg/dL — ABNORMAL LOW (ref 8.9–10.3)
Chloride: 96 mmol/L — ABNORMAL LOW (ref 98–111)
Creatinine, Ser: 1.34 mg/dL — ABNORMAL HIGH (ref 0.61–1.24)
GFR, Estimated: 60 mL/min — ABNORMAL LOW (ref >60)
Glucose, Bld: 94 mg/dL (ref 70–99)
Potassium: 3.6 mmol/L (ref 3.5–5.1)
Sodium: 132 mmol/L — ABNORMAL LOW (ref 135–145)

## 2023-04-25 NOTE — Discharge Summary (Signed)
 Physician Discharge Summary   Patient: Paul Hardy MRN: [id number] DOB: 03-18-1960  Admit date:     04/21/2023  Discharge date: 04/25/23  Discharge Physician: Briant Cedar   PCP: Everrett Coombe, DO   Recommendations at discharge:   Follow-up with PCP  Discharge Diagnoses: Principal Problem:   Ileus (HCC) Active Problems:   Chronic kidney disease (CKD), stage III (moderate) (HCC)   OSA on CPAP   COPD (chronic obstructive pulmonary disease) (HCC)   SBO (small bowel obstruction) (HCC)   Hypokalemia   Enteritis    Hospital Course: Daruis Swaim is a 63 y.o. male with medical history significant for CAD s/p CABG, HTN, COPD being admitted to the hospital with concern for partial bowel obstruction versus ileus. Pt reported sudden onset of abdominal pain, watery diarrhea, and vomiting X 1 day. Pt presented to [facility], where CT abd/pelvis showed evidence of possible partial small bowel obstruction or ileus.  Of note, patient was recently started on p.o. cefpodoxime and steroids for COPD exacerbation (has not completed both antibiotics and steroid dose). Due to persistent N/V, NG tube was placed. Gen surgery consulted.  Patient admitted for further management.     Today, patient reports improvement overall, denies any further nausea/vomiting, diarrhea, abdominal pain, fever/chills.  Patient denies any worsening shortness of breath or chest pain.  Patient stable to be discharged home to follow-up with PCP in 1 week    Assessment and Plan:  Norovirus gastroenteritis Presents with abdominal pain, vomiting, diarrhea, currently all resolved S/p NG tube Currently afebrile with no leukocytosis CT abdomen/pelvis showed markedly distended stomach and mid small bowel loops with gradual transition to decompressed distal small bowel and terminal ileum C. difficile negative, GI panel noted positive for norovirus General Surgery consulted Outpatient follow-up with PCP    COPD Currently saturating well on room air at rest and upon ambulation Currently appears stable, not in exacerbation  Possible CKD stage II Baseline creatinine around 1.4 Follow-up with PCP   CAD S/p CABG, currently chest pain-free Continue home Toprol-XL, Ranexa   BPH Flomax   GERD Protonix   Hyperlipidemia Lipitor       Consultants: General surgery Procedures performed: None Disposition: Home Diet recommendation:  Cardiac diet    DISCHARGE MEDICATION: Allergies as of 04/25/2023       Reactions   Bee Venom Anaphylaxis   Iodinated Contrast Media Anaphylaxis   Rash and Shortness of Breath   Hydrochlorothiazide Other (See Comments)   Dehydration and muscle spasms. Lasix does the same   Lisinopril Other (See Comments), Cough   Losartan is OK        Medication List     STOP taking these medications    cefpodoxime 200 MG tablet Commonly known as: VANTIN   ondansetron 4 MG disintegrating tablet Commonly known as: Zofran ODT   predniSONE 20 MG tablet Commonly known as: DELTASONE       TAKE these medications    albuterol 108 (90 Base) MCG/ACT inhaler Commonly known as: VENTOLIN HFA INHALE 2 PUFFS INTO THE LUNGS EVERY 6 HOURS AS NEEDED FOR WHEEZING What changed: See the new instructions.   aspirin 81 MG chewable tablet Chew 81 mg by mouth daily.   atorvastatin 80 MG tablet Commonly known as: LIPITOR Take 1 tablet (80 mg total) by mouth at bedtime.   benzonatate 100 MG capsule Commonly known as: TESSALON Take 1 capsule (100 mg total) by mouth every 8 (eight) hours.   Breztri Aerosphere 160-9-4.8 MCG/ACT Aero Generic drug:  Budeson-Glycopyrrol-Formoterol Inhale 2 puffs into the lungs 2 (two) times daily.   clopidogrel 75 MG tablet Commonly known as: PLAVIX Take 1 tablet (75 mg total) by mouth daily.   DULoxetine 30 MG capsule Commonly known as: CYMBALTA Take 1 capsule (30 mg total) by mouth daily.   EPINEPHrine 0.3 mg/0.3 mL Soaj  injection Commonly known as: EPI-PEN Inject 0.3 mg into the muscle as needed for anaphylaxis.   furosemide 40 MG tablet Commonly known as: LASIX Take 80 mg by mouth as needed for edema. 40 mg in the morning and 40mg  night   Klor-Con M20 20 MEQ tablet Generic drug: potassium chloride SA Take 20 mEq by mouth as needed (take with lasix).   metoprolol succinate 50 MG 24 hr tablet Commonly known as: TOPROL-XL Take 1 tablet (50 mg total) by mouth daily.   multivitamin tablet Take 1 tablet by mouth daily.   nitroGLYCERIN 0.4 MG SL tablet Commonly known as: NITROSTAT Place 1 tablet (0.4 mg total) under the tongue every 5 (five) minutes as needed for chest pain.   omeprazole 40 MG capsule Commonly known as: PRILOSEC Take 40 mg by mouth daily.   ranolazine 500 MG 12 hr tablet Commonly known as: RANEXA Take 500 mg by mouth 2 (two) times daily.   Symbicort 160-4.5 MCG/ACT inhaler Generic drug: budesonide-formoterol INHALE 2 PUFFS INTO THE LUNGS IN THE MORNING AND AT BEDTIME   tadalafil 20 MG tablet Commonly known as: CIALIS Take 20 mg by mouth as needed for erectile dysfunction.   tamsulosin 0.4 MG Caps capsule Commonly known as: FLOMAX Take 0.4 mg by mouth daily.        Follow-up Information     Everrett Coombe, DO. Schedule an appointment as soon as possible for a visit.   Specialty: Family Medicine Contact information: [address]  [address] [place] [address] [phone]                Discharge Exam: Ceasar Mons Weights   04/21/23 0551  Weight: 101 kg   General: NAD  Cardiovascular: S1, S2 present Respiratory: CTAB Abdomen: Soft, nontender, nondistended, bowel sounds present Musculoskeletal: No bilateral pedal edema noted Skin: Normal Psychiatry: Normal mood   Condition at discharge: stable  The results of significant diagnostics from this hospitalization (including imaging, microbiology, ancillary and laboratory) are listed below for  reference.   Imaging Studies: DG Abdomen 1 View Result Date: 04/21/2023 CLINICAL DATA:  NG tube placement EXAM: ABDOMEN - 1 VIEW limited x-ray for tube placement COMPARISON:  CT 04/21/2023 FINDINGS: Limited x-ray of the upper abdomen demonstrates enteric tube with tip overlying the midbody of the stomach. There are dilated loops of small bowel in the upper mid abdomen. Few nondilated loops of colon. IMPRESSION: Limited x-ray of the upper abdomen for tube placement has tube overlying the midbody of the stomach. Electronically Signed   By: Karen Kays M.D.   On: 04/21/2023 15:27   CT ABDOMEN PELVIS WO CONTRAST Result Date: 04/21/2023 CLINICAL DATA:  Abdominal pain, acute, nonlocalized EXAM: CT ABDOMEN AND PELVIS WITHOUT CONTRAST TECHNIQUE: Multidetector CT imaging of the abdomen and pelvis was performed following the standard protocol without IV contrast. RADIATION DOSE REDUCTION: This exam was performed according to the departmental dose-optimization program which includes automated exposure control, adjustment of the mA and/or kV according to patient size and/or use of iterative reconstruction technique. COMPARISON:  MR 04/09/2022 FINDINGS: Lower chest: No pleural or pericardial effusion. Coronary calcifications. Subsegmental scarring or atelectasis in the lung  bases. Hepatobiliary: No focal liver abnormality is seen. Status post cholecystectomy. No biliary dilatation. Pancreas: Unremarkable. No pancreatic ductal dilatation or surrounding inflammatory changes. Spleen: Normal in size without focal abnormality. Adrenals/Urinary Tract: No adrenal mass. Peripheral left renal calcifications largest 10 mm mid pole. No hydronephrosis or ureterectasis. Stable small bilateral renal cysts. Urinary bladder physiologically distended. Stomach/Bowel: Stomach is markedly distended. Duodenum decompressed. Multiple dilated mid small bowel loops, without evident wall thickening or pneumatosis. Gradual transition to  decompressed distal small bowel and terminal ileum. Normal appendix. The colon is incompletely distended, with scattered diverticula in ascending, transverse, and descending segments without adjacent inflammatory change. Vascular/Lymphatic: Mild calcified aortoiliac plaque without aneurysm. No abdominal or pelvic adenopathy. Reproductive: Prostate enlargement with central coarse calcifications. Other: No ascites.  No free air. Musculoskeletal: Sternotomy wires. IMPRESSION: 1. Markedly distended stomach and mid small bowel loops, with gradual transition to decompressed distal small bowel and terminal ileum. Findings may reflect ileus or partial small bowel obstruction. 2. Colonic diverticulosis. 3. Nonobstructive left nephrolithiasis. 4.  Aortic Atherosclerosis (ICD10-I70.0). Electronically Signed   By: Corlis Leak M.D.   On: 04/21/2023 11:04   DG Chest Portable 1 View Result Date: 04/21/2023 CLINICAL DATA:  Cough EXAM: PORTABLE CHEST 1 VIEW COMPARISON:  Chest radiograph dated 04/17/2023 FINDINGS: Low lung volumes with bronchovascular crowding. Bibasilar patchy and linear opacities. No pleural effusion or pneumothorax. Similar enlarged cardiomediastinal silhouette. Median sternotomy wires are nondisplaced. IMPRESSION: Low lung volumes with bronchovascular crowding. Bibasilar patchy and linear opacities, likely atelectasis. Electronically Signed   By: Agustin Cree M.D.   On: 04/21/2023 08:24   DG Chest 2 View Result Date: 04/17/2023 CLINICAL DATA:  Worsening cough.  History of COPD. EXAM: CHEST - 2 VIEW COMPARISON:  04/03/2023 FINDINGS: Previous median sternotomy and CABG procedure. Stable cardiomediastinal contours. Unchanged asymmetric elevation of left hemidiaphragm. No signs of pleural effusion, interstitial edema or airspace disease. IMPRESSION: 1. No acute cardiopulmonary disease. 2. Previous CABG procedure. Electronically Signed   By: Signa Kell M.D.   On: 04/17/2023 08:59   CT CHEST LUNG CA SCREEN LOW  DOSE W/O CM Result Date: 04/16/2023 CLINICAL DATA:  63 year old male former smoker with 40 pack-year smoking history, quit smoking 2021 EXAM: CT CHEST WITHOUT CONTRAST LOW-DOSE FOR LUNG CANCER SCREENING TECHNIQUE: Multidetector CT imaging of the chest was performed following the standard protocol without IV contrast. RADIATION DOSE REDUCTION: This exam was performed according to the departmental dose-optimization program which includes automated exposure control, adjustment of the mA and/or kV according to patient size and/or use of iterative reconstruction technique. COMPARISON:  03/30/2022 screening chest CT FINDINGS: Cardiovascular: Normal heart size. No significant pericardial effusion/thickening. Three-vessel coronary atherosclerosis status post CABG. Atherosclerotic nonaneurysmal thoracic aorta. Normal caliber pulmonary arteries. Mediastinum/Nodes: No significant thyroid nodules. Unremarkable esophagus. No pathologically enlarged axillary, mediastinal or hilar lymph nodes, noting limited sensitivity for the detection of hilar adenopathy on this noncontrast study. Lungs/Pleura: No pneumothorax. No pleural effusion. Mild centrilobular emphysema with diffuse bronchial wall thickening. No acute consolidative airspace disease or lung masses. No significant growth of previously visualized pulmonary nodules up to 4.6 mm in the right middle lobe along the minor fissure on series 3/image 164. No new significant pulmonary nodules. Upper abdomen: Cholecystectomy. Musculoskeletal: No aggressive appearing focal osseous lesions. Moderate thoracic spondylosis. Intact sternotomy wires. IMPRESSION: 1. Lung-RADS 2, benign appearance or behavior. Continue annual screening with low-dose chest CT without contrast in 12 months. 2. Aortic Atherosclerosis (ICD10-I70.0) and Emphysema (ICD10-J43.9). Electronically Signed   By: Jannifer Rodney.D.  On: 04/16/2023 21:09    Microbiology: Results for orders placed or performed during  the hospital encounter of 04/21/23  Resp panel by RT-PCR (RSV, Flu A&B, Covid) Anterior Nasal Swab     Status: None   Collection Time: 04/21/23  6:01 AM   Specimen: Anterior Nasal Swab  Result Value Ref Range Status   SARS Coronavirus 2 by RT PCR NEGATIVE NEGATIVE Final    Comment: (NOTE) SARS-CoV-2 target nucleic acids are NOT DETECTED.  The SARS-CoV-2 RNA is generally detectable in upper respiratory specimens during the acute phase of infection. The lowest concentration of SARS-CoV-2 viral copies this assay can detect is 138 copies/mL. A negative result does not preclude SARS-Cov-2 infection and should not be used as the sole basis for treatment or other patient management decisions. A negative result may occur with  improper specimen collection/handling, submission of specimen other than nasopharyngeal swab, presence of viral mutation(s) within the areas targeted by this assay, and inadequate number of viral copies(<138 copies/mL). A negative result must be combined with clinical observations, patient history, and epidemiological information. The expected result is Negative.  Fact Sheet for Patients:  BloggerCourse.com  Fact Sheet for Healthcare Providers:  SeriousBroker.it  This test is no t yet approved or cleared by the [place] FDA and  has been authorized for detection and/or diagnosis of SARS-CoV-2 by FDA under an Emergency Use Authorization (EUA). This EUA will remain  in effect (meaning this test can be used) for the duration of the COVID-19 declaration under Section 564(b)(1) of the Act, 21 U.S.C.section 360bbb-3(b)(1), unless the authorization is terminated  or revoked sooner.       Influenza A by PCR NEGATIVE NEGATIVE Final   Influenza B by PCR NEGATIVE NEGATIVE Final    Comment: (NOTE) The Xpert Xpress SARS-CoV-2/FLU/RSV plus assay is intended as an aid in the diagnosis of influenza from Nasopharyngeal swab  specimens and should not be used as a sole basis for treatment. Nasal washings and aspirates are unacceptable for Xpert Xpress SARS-CoV-2/FLU/RSV testing.  Fact Sheet for Patients: BloggerCourse.com  Fact Sheet for Healthcare Providers: SeriousBroker.it  This test is not yet approved or cleared by the [place] FDA and has been authorized for detection and/or diagnosis of SARS-CoV-2 by FDA under an Emergency Use Authorization (EUA). This EUA will remain in effect (meaning this test can be used) for the duration of the COVID-19 declaration under Section 564(b)(1) of the Act, 21 U.S.C. section 360bbb-3(b)(1), unless the authorization is terminated or revoked.     Resp Syncytial Virus by PCR NEGATIVE NEGATIVE Final    Comment: (NOTE) Fact Sheet for Patients: BloggerCourse.com  Fact Sheet for Healthcare Providers: SeriousBroker.it  This test is not yet approved or cleared by the [place] FDA and has been authorized for detection and/or diagnosis of SARS-CoV-2 by FDA under an Emergency Use Authorization (EUA). This EUA will remain in effect (meaning this test can be used) for the duration of the COVID-19 declaration under Section 564(b)(1) of the Act, 21 U.S.C. section 360bbb-3(b)(1), unless the authorization is terminated or revoked.  Performed at [facility], [address] [place], [place] [address]   C Difficile Quick Screen w PCR reflex     Status: None   Collection Time: 04/22/23  3:05 PM   Specimen: STOOL  Result Value Ref Range Status   C Diff antigen NEGATIVE NEGATIVE Final   C Diff toxin NEGATIVE NEGATIVE Final   C Diff interpretation No C. difficile detected.  Final  Comment: Performed at [facility], [address], [place], [place] [address]  Gastrointestinal Panel by PCR , Stool     Status: Abnormal   Collection Time:  04/22/23  3:05 PM   Specimen: STOOL  Result Value Ref Range Status   Campylobacter species NOT DETECTED NOT DETECTED Final   Plesimonas shigelloides NOT DETECTED NOT DETECTED Final   Salmonella species NOT DETECTED NOT DETECTED Final   Yersinia enterocolitica NOT DETECTED NOT DETECTED Final   Vibrio species NOT DETECTED NOT DETECTED Final   Vibrio cholerae NOT DETECTED NOT DETECTED Final   Enteroaggregative E coli (EAEC) NOT DETECTED NOT DETECTED Final   Enteropathogenic E coli (EPEC) NOT DETECTED NOT DETECTED Final   Enterotoxigenic E coli (ETEC) NOT DETECTED NOT DETECTED Final   Shiga like toxin producing E coli (STEC) NOT DETECTED NOT DETECTED Final   Shigella/Enteroinvasive E coli (EIEC) NOT DETECTED NOT DETECTED Final   Cryptosporidium NOT DETECTED NOT DETECTED Final   Cyclospora cayetanensis NOT DETECTED NOT DETECTED Final   Entamoeba histolytica NOT DETECTED NOT DETECTED Final   Giardia lamblia NOT DETECTED NOT DETECTED Final   Adenovirus F40/41 NOT DETECTED NOT DETECTED Final   Astrovirus NOT DETECTED NOT DETECTED Final   Norovirus GI/GII DETECTED (A) NOT DETECTED Final    Comment: RESULT CALLED TO, READ BACK BY AND VERIFIED WITH: CHELSEA BROCK AT 1629 04/23/23.PMF    Rotavirus A NOT DETECTED NOT DETECTED Final   Sapovirus (I, II, IV, and V) NOT DETECTED NOT DETECTED Final    Comment: Performed at [facility], [address] [place], [place] [address]    Labs: CBC: Recent Labs  Lab 04/21/23 0601 04/22/23 0800 04/23/23 0652  WBC 9.9 10.8* 9.0  NEUTROABS 8.5*  --  5.1  HGB 16.9 15.6 15.2  HCT 51.0 51.3 47.7  MCV 89.6 96.8 94.5  PLT 223 195 178   Basic Metabolic Panel: Recent Labs  Lab 04/21/23 0601 04/22/23 0800 04/22/23 0824 04/23/23 0652 04/24/23 0721 04/25/23 0633  NA 138 140  --  137 136 132*  K 3.4* 4.0  --  3.9 3.5 3.6  CL 100 105  --  103 101 96*  CO2 24 24  --  26 28 29   GLUCOSE 185* 123*  --  107* 105* 94  BUN 26* 27*  --  22 18 17    CREATININE 1.48* 1.28*  --  1.45* 1.40* 1.34*  CALCIUM 8.9 8.3*  --  8.0* 7.9* 7.7*  MG  --   --  2.2  --   --   --   PHOS  --   --  2.4*  --   --   --    Liver Function Tests: Recent Labs  Lab 04/21/23 0601  AST 24  ALT 28  ALKPHOS 94  BILITOT 1.3*  PROT 7.4  ALBUMIN 3.8   CBG: No results for input(s): "GLUCAP" in the last 168 hours.  Discharge time spent: greater than 30 minutes.  Signed: Briant Cedar, MD [facility] 04/25/2023

## 2023-04-25 NOTE — Progress Notes (Signed)
 Patient Saturations Lying down = 93%  Patient Saturations on Room Air while sitting = 94%  Patient Saturations on on room air while ambulating 93%

## 2023-04-25 NOTE — Plan of Care (Signed)

## 2023-04-26 ENCOUNTER — Encounter: Payer: Self-pay | Admitting: Primary Care

## 2023-04-26 ENCOUNTER — Telehealth (INDEPENDENT_AMBULATORY_CARE_PROVIDER_SITE_OTHER): Payer: 59 | Admitting: Primary Care

## 2023-04-26 VITALS — BP 109/74 | Ht 73.0 in | Wt 225.0 lb

## 2023-04-26 DIAGNOSIS — J432 Centrilobular emphysema: Secondary | ICD-10-CM

## 2023-04-26 DIAGNOSIS — G4730 Sleep apnea, unspecified: Secondary | ICD-10-CM

## 2023-04-26 DIAGNOSIS — J449 Chronic obstructive pulmonary disease, unspecified: Secondary | ICD-10-CM

## 2023-04-26 NOTE — Progress Notes (Signed)
 Virtual Visit via Video Note  I connected with Paul Hardy on 04/26/23 at  9:30 AM EST by a video enabled telemedicine application and verified that I am speaking with the correct person using two identifiers.  Location: Patient: Home Provider: Office   I discussed the limitations of evaluation and management by telemedicine and the availability of in person appointments. The patient expressed understanding and agreed to proceed.  History of Present Illness: Discussed the use of AI scribe software for clinical note transcription with the patient, who gave verbal consent to proceed.  History of Present Illness   Paul Hardy is a 63 year old male with COPD who presents for follow-up of lung function and recent hospitalization.  He underwent a lung function test on March 27, 2023, which showed moderate obstructive lung disease with a moderate diffusion defect and evidence of air trapping. His lung function was 66%, slightly decreased from 69% in January 2023, but considered stable over the two-year period. There was no significant improvement after albuterol administration.  A LDCT scan in February 2025 for lung cancer screening showed mild emphysema and bronchial wall thickening. There were no new pulmonary nodules, and the existing nodule in the right middle lobe remained stable at 4.6 mm.  He is a former smoker, having quit in 2021-2022. He is currently using the [facility] inhaler and has previously used Symbicort. He is intolerant to CPAP for sleep apnea and manages it with positional sleep adjustments.  No chronic cough. He uses Tessalon (benzonatate) for sputum or congestion and was advised to use Mucinex (guaifenesin) once or twice daily for chest congestion.  He was recently hospitalized for a small bowel obstruction due to norovirus and was discharged yesterday. He is currently in recovery, focusing on hydration and a bland diet.      Observations/Objective:  - Unable to  connect to audio portion of video visit, connected via televisit   Assessment and Plan:  1. Centrilobular emphysema (HCC) (Primary) - AMB referral to pulmonary rehabilitation  2. Chronic obstructive pulmonary disease, unspecified COPD type (HCC) - AMB referral to pulmonary rehabilitation     Chronic Obstructive Pulmonary Disease (COPD) Moderate obstructive lung disease with moderate diffusion defect and air trapping. Stable lung function compared to previous year. No significant improvement with albuterol, suggesting no asthmatic features. Patient is on Breztri inhaler. -Continue Breztri Aerosphere two puffs twice daily  -Consider Mucinex once or twice daily for productive cough. -Refer to pulmonary rehab for endurance and breathing techniques.  Sleep Apnea Intolerant to CPAP. Not in favor of Inspire. -Advise positional sleep (side sleeping or head elevation).  Lung Cancer Screening CT scan Feb 2025 showed mild emphysema with bronchial wall thickening. No pneumonia, lung masses, or significant growth of previous pulmonary nodules. Lung RADS 2, indicating benign appearance and behavior. -Continue annual CT scan for lung cancer screening.     Follow Up Instructions:  Follow-up -Schedule appointment with Dr. Isaiah Serge in 6 months. -Consider pulmonary rehab referral.    I discussed the assessment and treatment plan with the patient. The patient was provided an opportunity to ask questions and all were answered. The patient agreed with the plan and demonstrated an understanding of the instructions.   The patient was advised to call back or seek an in-person evaluation if the symptoms worsen or if the condition fails to improve as anticipated.  I provided 22 minutes of non-face-to-face time during this encounter.   Glenford Bayley, NP

## 2023-04-26 NOTE — Patient Instructions (Signed)
 -  CHRONIC OBSTRUCTIVE PULMONARY DISEASE (COPD): COPD is a chronic lung disease that makes it hard to breathe due to obstructed airflow. Your lung function is stable, and you should continue using the [facility] inhaler. You may also use Mucinex once or twice daily if you have a productive cough. We recommend pulmonary rehab to help with endurance and breathing techniques.  -SLEEP APNEA: Sleep apnea is a condition where breathing repeatedly stops and starts during sleep. Since you are intolerant to CPAP, we advise you to sleep on your side or with your head elevated to manage your symptoms.  -LUNG CANCER SCREENING: Your recent CT scan showed mild emphysema and bronchial wall thickening, but no new lung masses or significant changes in previous nodules. This indicates a benign condition. We will continue with annual CT scans to monitor your lung health.  INSTRUCTIONS:  Please schedule an appointment with Dr. Isaiah Serge in 6 months. We also recommend considering a referral to pulmonary rehab.

## 2023-04-28 ENCOUNTER — Encounter: Payer: Self-pay | Admitting: Medical-Surgical

## 2023-04-28 ENCOUNTER — Ambulatory Visit: Admitting: Medical-Surgical

## 2023-04-28 VITALS — BP 123/83 | HR 58 | Resp 20 | Ht 73.0 in | Wt 224.0 lb

## 2023-04-28 DIAGNOSIS — M79672 Pain in left foot: Secondary | ICD-10-CM

## 2023-04-28 DIAGNOSIS — M79671 Pain in right foot: Secondary | ICD-10-CM | POA: Diagnosis not present

## 2023-04-28 DIAGNOSIS — Z09 Encounter for follow-up examination after completed treatment for conditions other than malignant neoplasm: Secondary | ICD-10-CM

## 2023-04-28 MED ORDER — GABAPENTIN 300 MG PO CAPS: 300.0000 mg | ORAL_CAPSULE | Freq: Every day | ORAL | 3 refills | Status: DC

## 2023-04-28 NOTE — Progress Notes (Signed)
        Established patient visit  History, exam, impression, and plan:  1. Hospital discharge follow-up (Primary) Very pleasant 63 year old male accompanied by his wife presenting today for hospital discharge follow-up.  He was hospitalized from 04/21/2023 through 04/25/2023 for partial small bowel obstruction and norovirus.  He had been having significant nausea and vomiting that finally gave way to diarrhea.  At 1 point, they had an NG tube in place but this has been removed.  He was released from the hospital on 04/25/2023 with instructions to follow-up with his PCP.  Today, he reports that the nausea, vomiting, and diarrhea have resolved.  He has some abdominal soreness along the bilateral lower quadrants from the symptoms that he experienced.  He also notes some sternal soreness from the vomiting that he had.  He is back to eating regular foods and had his first solid bowel movement yesterday.  Still feeling weak and fatigued.  Has been out of work since 04/21/2023 and plans to return on 05/07/2023.  Plan to have FMLA paperwork sent to the office to allow for this time as well as intermittent FMLA to cover 1-3 episodes per month with 1 to 3 days per episode for future flares, doctors appointments, and any treatments that may be needed.  FMLA paperwork for his wife to cover time missed completed today.  A copy has been sent to scanning, the original given back to her, and a copy for our records.  2. Bilateral foot pain Reports a long history of having lower extremity discomfort and pain at night that he attributes to restless leg syndrome.  Lately it has become a severe burning pain in the bottoms of his feet and toes that occurs every night and makes it very difficult to sleep.  Has had multiple studies with no one able to make a definitive diagnosis or find a suitable treatment.  It is now affecting his quality of sleep and causing spikes in his anxiety to the point that it is disruptive to his everyday  life.  Discussed various options.  He is not currently taking gabapentin.  Discussed the nature of the medication and the various uses that it has.  He is open to giving this a try so adding gabapentin 300 mg nightly to see if this helps.  Advised him to follow-up with his PCP in 3 to 4 weeks.   Procedures performed this visit: None.  Return in about 4 weeks (around 05/26/2023) for Bilateral foot pain follow-up with PCP.  __________________________________ Paul Ohm, DNP, APRN, FNP-BC Primary Care and Sports Medicine [facility]

## 2023-05-01 ENCOUNTER — Encounter (HOSPITAL_COMMUNITY): Payer: Self-pay

## 2023-05-01 ENCOUNTER — Telehealth (HOSPITAL_COMMUNITY): Payer: Self-pay

## 2023-05-01 NOTE — Progress Notes (Signed)
 Called pt to see if he was interested in participating in the pulmonary rehab program. No answer. VM left.

## 2023-05-01 NOTE — Progress Notes (Signed)
 Received referral from Dr. Isaiah Serge for this pt to participate in Pulmonary Rehab with the diagnosis of Centrilobular emphysema. Clinical review of pt follow up appt on 04/26/23 Pulmonary office note. Pt appropriate for scheduling for Pulmonary rehab. Will forward to support staff for scheduling and verification of insurance eligibility/benefits with pt consent.   [facility]

## 2023-05-08 ENCOUNTER — Telehealth: Payer: Self-pay | Admitting: Family Medicine

## 2023-05-08 NOTE — Telephone Encounter (Signed)
 Copied from CRM [id number]. Topic: General - Other >> May 05, 2023  4:53 PM Everette C wrote: Reason for CRM: The patient's wife has been in contact with their HR department and been told that their Leave of Absence paperwork has not been received  The patient's wife has called to request resubmission of the paperwork when possible  Please fax information back to [phone]  The patient's wife would like to be contacted to confirm resubmission if/when possible

## 2023-05-09 ENCOUNTER — Telehealth: Payer: Self-pay | Admitting: Family Medicine

## 2023-05-09 NOTE — Telephone Encounter (Signed)
 Was this faxed over after their appointment? Do we have the copy of the paperwork that we can refax if needed? Thanks, Ander Slade

## 2023-05-09 NOTE — Telephone Encounter (Signed)
 Doesn't look like we faxed it. I will fax it today.

## 2023-05-09 NOTE — Telephone Encounter (Signed)
 Copied from CRM [id number]. Topic: General - Other >> May 09, 2023 10:08 AM Haroldine Laws wrote: Pt's wife is now getting a message from her work saying they still have not received the paper work for her FMLA .  They are denying because they need it now to approve her leave of absence to take care of her husband.  She said it could be faxed to her [phone] or the [employer] it is [phone]

## 2023-05-09 NOTE — Telephone Encounter (Signed)
 Faxing today per Okey Regal.

## 2023-05-09 NOTE — Telephone Encounter (Unsigned)
 Copied from CRM [id number]. Topic: General - Other >> May 09, 2023 10:08 AM Haroldine Laws wrote: Pt's wife is now getting a message from her work saying they still have not received the paper work for her FMLA .  They are denying because they need it now to approve her leave of absence to take care of her husband.  She said it could be faxed to her [phone] or the [employer] it is [phone]

## 2023-05-10 NOTE — Telephone Encounter (Signed)
 Can you please refax this information.

## 2023-05-10 NOTE — Telephone Encounter (Signed)
 I re faxed this information 05/09/2023

## 2023-05-10 NOTE — Telephone Encounter (Signed)
 The paperwork was faxed on 05/09/2023 to the number indicated. Okey Regal double checked it and the number listed was where it went to.  Thanks, Ander Slade

## 2023-05-10 NOTE — Telephone Encounter (Signed)
 I double checked the fax number listed below and that is where I faxed it to 05/09/2023.

## 2023-05-15 ENCOUNTER — Other Ambulatory Visit (HOSPITAL_COMMUNITY): Payer: Self-pay

## 2023-05-24 ENCOUNTER — Other Ambulatory Visit (HOSPITAL_COMMUNITY): Payer: Self-pay

## 2023-05-25 ENCOUNTER — Other Ambulatory Visit (HOSPITAL_COMMUNITY): Payer: Self-pay

## 2023-05-26 ENCOUNTER — Other Ambulatory Visit (HOSPITAL_COMMUNITY): Payer: Self-pay

## 2023-05-31 ENCOUNTER — Telehealth: Payer: Self-pay | Admitting: Urology

## 2023-05-31 ENCOUNTER — Telehealth (HOSPITAL_COMMUNITY): Payer: Self-pay

## 2023-05-31 ENCOUNTER — Encounter (HOSPITAL_COMMUNITY): Payer: Self-pay

## 2023-05-31 ENCOUNTER — Other Ambulatory Visit (HOSPITAL_COMMUNITY): Payer: Self-pay

## 2023-05-31 ENCOUNTER — Other Ambulatory Visit: Payer: Self-pay | Admitting: Medical-Surgical

## 2023-05-31 NOTE — Progress Notes (Signed)
 Called patient to see if he was interested in participating in the Pulmonary Rehab Program. Patient will come in for orientation on 06/02/23 and will attend the 8:15 exercise class.  Durel Salts, RRT Mailed package.

## 2023-05-31 NOTE — Telephone Encounter (Signed)
 Pt needs new order for tamsulosin (FLOMAX) 0.4 MG CAPS sent to [facility] [address], [place] [address] Phone: [phone]  Fax: [phone] DEA #: [id number]. Due to insurance reason [employer] is not covering it at [facility]. Please advise.

## 2023-06-01 ENCOUNTER — Telehealth (HOSPITAL_COMMUNITY): Payer: Self-pay

## 2023-06-01 NOTE — Telephone Encounter (Signed)
 Spoke with patient, he is scheduled to see stoneking on 06/16/23 to follow up and renew prescriptions.

## 2023-06-01 NOTE — Telephone Encounter (Signed)
 Pt insurance is active and benefits verified through [employer]. Co-pay $0, DED $2,000/$2,000 met, out of pocket $4,000/$4,000 met, co-insurance 20%. No pre-authorization required. 06/01/2023 @ 11:23am, spoke with Jonny Ruiz, REF# [id number].

## 2023-06-02 ENCOUNTER — Ambulatory Visit (HOSPITAL_COMMUNITY)

## 2023-06-05 ENCOUNTER — Encounter (HOSPITAL_COMMUNITY): Payer: Self-pay

## 2023-06-05 ENCOUNTER — Encounter (HOSPITAL_COMMUNITY)
Admission: RE | Admit: 2023-06-05 | Discharge: 2023-06-05 | Disposition: A | Discharge status: Home / Self Care | Source: Ambulatory Visit | Attending: Pulmonary Disease | Admitting: Pulmonary Disease

## 2023-06-05 VITALS — BP 120/70 | HR 67 | Wt 222.2 lb

## 2023-06-05 DIAGNOSIS — J432 Centrilobular emphysema: Secondary | ICD-10-CM | POA: Insufficient documentation

## 2023-06-05 HISTORY — DX: Atherosclerotic heart disease of native coronary artery without angina pectoris: I25.10

## 2023-06-05 HISTORY — DX: Essential (primary) hypertension: I10

## 2023-06-05 NOTE — Progress Notes (Signed)
 Pulmonary Rehab Orientation Physical Assessment Note  Physical assessment reveals patient is alert and oriented x 4. Heart rate is normal, breath sounds clear to auscultation, no wheezes, rales, or rhonchi. Denies chronic cough. Bowel sounds present x4 quads. Pt denies abdominal discomfort, nausea, vomiting, diarrhea or constipation. Grip strength equal, strong. Distal pulses +2; no swelling to lower extremities.   Willard Harman, RN, BSN

## 2023-06-05 NOTE — Progress Notes (Signed)
 Pulmonary Individual Treatment Plan  Patient Details  Name: Paul Hardy MRN: [id number] Date of Birth: March 06, 1960 Referring Provider:   Doristine Devoid Pulmonary Rehab Walk Test from 06/05/2023 in [facility]  Referring Provider Mannam       Initial Encounter Date:  Flowsheet Row Pulmonary Rehab Walk Test from 06/05/2023 in [facility]  Date 06/05/23       Visit Diagnosis: Centrilobular emphysema (HCC)  Patient's Home Medications on Admission:   Current Outpatient Medications:    albuterol (VENTOLIN HFA) 108 (90 Base) MCG/ACT inhaler, INHALE 2 PUFFS INTO THE LUNGS EVERY 6 HOURS AS NEEDED FOR WHEEZING (Patient taking differently: Inhale 2 puffs into the lungs every 6 (six) hours as needed for wheezing or shortness of breath.), Disp: 8.5 each, Rfl: 3   aspirin 81 MG chewable tablet, Chew 81 mg by mouth daily., Disp: , Rfl:    atorvastatin (LIPITOR) 80 MG tablet, Take 1 tablet (80 mg total) by mouth at bedtime., Disp: 90 tablet, Rfl: 3   benzonatate (TESSALON) 100 MG capsule, Take 1 capsule (100 mg total) by mouth every 8 (eight) hours., Disp: 21 capsule, Rfl: 0   Budeson-Glycopyrrol-Formoterol (BREZTRI AEROSPHERE) 160-9-4.8 MCG/ACT AERO, Inhale 2 puffs into the lungs 2 (two) times daily., Disp: 21.4 g, Rfl: 0   clopidogrel (PLAVIX) 75 MG tablet, Take 1 tablet (75 mg total) by mouth daily., Disp: 90 tablet, Rfl: 0   DULoxetine (CYMBALTA) 30 MG capsule, Take 1 capsule (30 mg total) by mouth daily., Disp: 90 capsule, Rfl: 2   EPINEPHrine 0.3 mg/0.3 mL IJ SOAJ injection, Inject 0.3 mg into the muscle as needed for anaphylaxis., Disp: , Rfl:    furosemide (LASIX) 40 MG tablet, Take 80 mg by mouth as needed for edema. 40 mg in the morning and 40mg  night, Disp: , Rfl:    gabapentin (NEURONTIN) 300 MG capsule, Take 1 capsule (300 mg total) by mouth at bedtime., Disp: 30 capsule,  Rfl: 3   KLOR-CON M20 20 MEQ tablet, Take 20 mEq by mouth as needed (take with lasix)., Disp: , Rfl:    metoprolol succinate (TOPROL-XL) 50 MG 24 hr tablet, Take 1 tablet (50 mg total) by mouth daily., Disp: 90 tablet, Rfl: 3   Multiple Vitamin (MULTIVITAMIN) tablet, Take 1 tablet by mouth daily., Disp: , Rfl:    nitroGLYCERIN (NITROSTAT) 0.4 MG SL tablet, Place 1 tablet (0.4 mg total) under the tongue every 5 (five) minutes as needed for chest pain., Disp: 25 tablet, Rfl: 0   omeprazole (PRILOSEC) 40 MG capsule, Take 40 mg by mouth daily., Disp: , Rfl:    ranolazine (RANEXA) 500 MG 12 hr tablet, Take 500 mg by mouth 2 (two) times daily., Disp: , Rfl:    SYMBICORT 160-4.5 MCG/ACT inhaler, INHALE 2 PUFFS INTO THE LUNGS IN THE MORNING AND AT BEDTIME, Disp: 30.6 each, Rfl: 1   tadalafil (CIALIS) 20 MG tablet, Take 20 mg by mouth as needed for erectile dysfunction., Disp: , Rfl:    tamsulosin (FLOMAX) 0.4 MG CAPS capsule, Take 0.4 mg by mouth daily., Disp: , Rfl:   Past Medical History: Past Medical History:  Diagnosis Date   CHF (congestive heart failure) (HCC)    Coronary artery disease    Hypertension     Tobacco Use: Social History   Tobacco Use  Smoking Status Former   Current packs/day: 0.00   Average packs/day: 1 pack/day for  22.8 years (22.8 ttl pk-yrs)   Types: Cigarettes   Start date: 02/21/1998   Quit date: 11/24/2020   Years since quitting: 2.5   Passive exposure: Never  Smokeless Tobacco Never    Labs: Review Flowsheet  More data exists      Latest Ref Rng & Units 10/21/2020 04/23/2021 09/28/2021 03/29/2022 08/12/2022  Labs for ITP Cardiac and Pulmonary Rehab  Cholestrol <200 mg/dL 528  413  - 244  010   LDL (calc) mg/dL (calc) 98  64  - 46  58   HDL-C > OR = 40 mg/dL 37  32  - 33  36   Trlycerides <150 mg/dL 272  536  - 644  034   Hemoglobin A1c <5.7 % of total Hgb - - 5.9  - -    Capillary Blood Glucose: No results found for: "GLUCAP"   Pulmonary Assessment  Scores:  Pulmonary Assessment Scores     Row Name 06/05/23 0918         ADL UCSD   ADL Phase Entry     SOB Score total 8       CAT Score   CAT Score 7       mMRC Score   mMRC Score 1             UCSD: Self-administered rating of dyspnea associated with activities of daily living (ADLs) 6-point scale (0 = "not at all" to 5 = "maximal or unable to do because of breathlessness")  Scoring Scores range from 0 to 120.  Minimally important difference is 5 units  CAT: CAT can identify the health impairment of COPD patients and is better correlated with disease progression.  CAT has a scoring range of zero to 40. The CAT score is classified into four groups of low (less than 10), medium (10 - 20), high (21-30) and very high (31-40) based on the impact level of disease on health status. A CAT score over 10 suggests significant symptoms.  A worsening CAT score could be explained by an exacerbation, poor medication adherence, poor inhaler technique, or progression of COPD or comorbid conditions.  CAT MCID is 2 points  mMRC: mMRC (Modified Medical Research Council) Dyspnea Scale is used to assess the degree of baseline functional disability in patients of respiratory disease due to dyspnea. No minimal important difference is established. A decrease in score of 1 point or greater is considered a positive change.   Pulmonary Function Assessment:  Pulmonary Function Assessment - 06/05/23 0926       Breath   Bilateral Breath Sounds Clear    Shortness of Breath Yes;Limiting activity             Exercise Target Goals: Exercise Program Goal: Individual exercise prescription set using results from initial 6 min walk test and THRR while considering  patient's activity barriers and safety.   Exercise Prescription Goal: Initial exercise prescription builds to 30-45 minutes a day of aerobic activity, 2-3 days per week.  Home exercise guidelines will be given to patient during program as  part of exercise prescription that the participant will acknowledge.  Activity Barriers & Risk Stratification:  Activity Barriers & Cardiac Risk Stratification - 06/05/23 0924       Activity Barriers & Cardiac Risk Stratification   Activity Barriers Muscular Weakness;Shortness of Breath;Deconditioning    Cardiac Risk Stratification Low             6 Minute Walk:  6 Minute Walk  Row Name 06/05/23 0924         6 Minute Walk   Phase Initial     Distance 1245 feet     Walk Time 6 minutes     # of Rest Breaks 0     MPH 0.94     METS 3.01     RPE 10.5     Perceived Dyspnea  0     VO2 Peak 10.55     Symptoms No     Resting HR 67 bpm     Resting BP 120/70     Resting Oxygen Saturation  95 %     Exercise Oxygen Saturation  during 6 min walk 93 %     Max Ex. HR 78 bpm     Max Ex. BP 134/80     2 Minute Post BP 118/74       Interval HR   1 Minute HR 74     2 Minute HR 74     3 Minute HR 77     4 Minute HR 78     5 Minute HR 77     6 Minute HR 77     2 Minute Post HR 68     Interval Heart Rate? Yes       Interval Oxygen   Interval Oxygen? Yes     Baseline Oxygen Saturation % 95 %     1 Minute Oxygen Saturation % 96 %     1 Minute Liters of Oxygen 0 L     2 Minute Oxygen Saturation % 93 %     2 Minute Liters of Oxygen 0 L     3 Minute Oxygen Saturation % 94 %     3 Minute Liters of Oxygen 0 L     4 Minute Oxygen Saturation % 94 %     4 Minute Liters of Oxygen 0 L     5 Minute Oxygen Saturation % 95 %     5 Minute Liters of Oxygen 0 L     6 Minute Oxygen Saturation % 96 %     6 Minute Liters of Oxygen 0 L     2 Minute Post Oxygen Saturation % 95 %     2 Minute Post Liters of Oxygen 0 L              Oxygen Initial Assessment:  Oxygen Initial Assessment - 06/05/23 0925       Home Oxygen   Home Oxygen Device None    Sleep Oxygen Prescription None    Home Exercise Oxygen Prescription None    Home Resting Oxygen Prescription None      Initial 6  min Walk   Oxygen Used None      Program Oxygen Prescription   Program Oxygen Prescription None      Intervention   Short Term Goals To learn and understand importance of maintaining oxygen saturations>88%;To learn and demonstrate proper use of respiratory medications;To learn and understand importance of monitoring SPO2 with pulse oximeter and demonstrate accurate use of the pulse oximeter.;To learn and demonstrate proper pursed lip breathing techniques or other breathing techniques.     Long  Term Goals Maintenance of O2 saturations>88%;Compliance with respiratory medication;Verbalizes importance of monitoring SPO2 with pulse oximeter and return demonstration;Exhibits proper breathing techniques, such as pursed lip breathing or other method taught during program session;Demonstrates proper use of MDI's  Oxygen Re-Evaluation:   Oxygen Discharge (Final Oxygen Re-Evaluation):   Initial Exercise Prescription:  Initial Exercise Prescription - 06/05/23 0900       Date of Initial Exercise RX and Referring Provider   Date 06/05/23    Referring Provider Mannam    Expected Discharge Date 08/31/23      Treadmill   MPH 2.5    Grade 0    Minutes 15    METs 2.5      Elliptical   Level 1    Speed 1    Minutes 15    METs 3      Prescription Details   Frequency (times per week) 2    Duration Progress to 30 minutes of continuous aerobic without signs/symptoms of physical distress      Intensity   THRR 40-80% of Max Heartrate 63-126    Ratings of Perceived Exertion 11-13    Perceived Dyspnea 0-4      Progression   Progression Continue to progress workloads to maintain intensity without signs/symptoms of physical distress.      Resistance Training   Training Prescription Yes    Weight blue bands    Reps 10-15             Perform Capillary Blood Glucose checks as needed.  Exercise Prescription Changes:   Exercise Comments:   Exercise Goals and  Review:   Exercise Goals     Row Name 06/05/23 0901             Exercise Goals   Increase Physical Activity Yes       Intervention Provide advice, education, support and counseling about physical activity/exercise needs.;Develop an individualized exercise prescription for aerobic and resistive training based on initial evaluation findings, risk stratification, comorbidities and participant's personal goals.       Expected Outcomes Short Term: Attend rehab on a regular basis to increase amount of physical activity.;Long Term: Exercising regularly at least 3-5 days a week.;Long Term: Add in home exercise to make exercise part of routine and to increase amount of physical activity.       Increase Strength and Stamina Yes       Intervention Provide advice, education, support and counseling about physical activity/exercise needs.;Develop an individualized exercise prescription for aerobic and resistive training based on initial evaluation findings, risk stratification, comorbidities and participant's personal goals.       Expected Outcomes Short Term: Increase workloads from initial exercise prescription for resistance, speed, and METs.;Short Term: Perform resistance training exercises routinely during rehab and add in resistance training at home;Long Term: Improve cardiorespiratory fitness, muscular endurance and strength as measured by increased METs and functional capacity ( )       Able to understand and use rate of perceived exertion (RPE) scale Yes       Intervention Provide education and explanation on how to use RPE scale       Expected Outcomes Short Term: Able to use RPE daily in rehab to express subjective intensity level;Long Term:  Able to use RPE to guide intensity level when exercising independently       Able to understand and use Dyspnea scale Yes       Intervention Provide education and explanation on how to use Dyspnea scale       Expected Outcomes Short Term: Able to use  Dyspnea scale daily in rehab to express subjective sense of shortness of breath during exertion;Long Term: Able to use Dyspnea scale to guide intensity level when  exercising independently       Knowledge and understanding of Target Heart Rate Range (THRR) Yes       Intervention Provide education and explanation of THRR including how the numbers were predicted and where they are located for reference       Expected Outcomes Short Term: Able to state/look up THRR;Long Term: Able to use THRR to govern intensity when exercising independently;Short Term: Able to use daily as guideline for intensity in rehab       Understanding of Exercise Prescription Yes       Intervention Provide education, explanation, and written materials on patient's individual exercise prescription       Expected Outcomes Short Term: Able to explain program exercise prescription;Long Term: Able to explain home exercise prescription to exercise independently                Exercise Goals Re-Evaluation :   Discharge Exercise Prescription (Final Exercise Prescription Changes):   Nutrition:  Target Goals: Understanding of nutrition guidelines, daily intake of sodium 1500mg , cholesterol 200mg , calories 30% from fat and 7% or less from saturated fats, daily to have 5 or more servings of fruits and vegetables.  Biometrics:  Pre Biometrics - 06/05/23 0900       Pre Biometrics   Grip Strength 42 kg              Nutrition Therapy Plan and Nutrition Goals:   Nutrition Assessments:  MEDIFICTS Score Key: >=70 Need to make dietary changes  40-70 Heart Healthy Diet <= 40 Therapeutic Level Cholesterol Diet   Picture Your Plate Scores: <78 Unhealthy dietary pattern with much room for improvement. 41-50 Dietary pattern unlikely to meet recommendations for good health and room for improvement. 51-60 More healthful dietary pattern, with some room for improvement.  >60 Healthy dietary pattern, although there may be  some specific behaviors that could be improved.    Nutrition Goals Re-Evaluation:   Nutrition Goals Discharge (Final Nutrition Goals Re-Evaluation):   Psychosocial: Target Goals: Acknowledge presence or absence of significant depression and/or stress, maximize coping skills, provide positive support system. Participant is able to verbalize types and ability to use techniques and skills needed for reducing stress and depression.  Initial Review & Psychosocial Screening:  Initial Psych Review & Screening - 06/05/23 0922       Initial Review   Current issues with History of Depression;Current Psychotropic Meds      Family Dynamics   Good Support System? Yes      Barriers   Psychosocial barriers to participate in program There are no identifiable barriers or psychosocial needs.      Screening Interventions   Interventions Encouraged to exercise             Quality of Life Scores:  Scores of 19 and below usually indicate a poorer quality of life in these areas.  A difference of  2-3 points is a clinically meaningful difference.  A difference of 2-3 points in the total score of the Quality of Life Index has been associated with significant improvement in overall quality of life, self-image, physical symptoms, and general health in studies assessing change in quality of life.  PHQ-9: Review Flowsheet  More data exists      06/05/2023 03/16/2023 08/12/2022 03/14/2022 12/09/2021  Depression screen PHQ 2/9  Decreased Interest 0 0 0 0 1  Down, Depressed, Hopeless 0 0 0 0 0  PHQ - 2 Score 0 0 0 0 1  Altered sleeping 1 - - -  1  Tired, decreased energy 1 - - - 1  Change in appetite 0 - - - 0  Feeling bad or failure about yourself  0 - - - 0  Trouble concentrating 0 - - - 0  Moving slowly or fidgety/restless 0 - - - 0  Suicidal thoughts 0 - - - 0  PHQ-9 Score 2 - - - 3  Difficult doing work/chores Not difficult at all - - - Somewhat difficult   Interpretation of Total Score   Total Score Depression Severity:  1-4 = Minimal depression, 5-9 = Mild depression, 10-14 = Moderate depression, 15-19 = Moderately severe depression, 20-27 = Severe depression   Psychosocial Evaluation and Intervention:  Psychosocial Evaluation - 06/05/23 0945       Psychosocial Evaluation & Interventions   Interventions Encouraged to exercise with the program and follow exercise prescription    Comments Jeff denies any psychosocial barriers or concerns at this time    Expected Outcomes For [place] to participate in PR free of any psychosocial barriers or concerns    Continue Psychosocial Services  No Follow up required             Psychosocial Re-Evaluation:   Psychosocial Discharge (Final Psychosocial Re-Evaluation):   Education: Education Goals: Education classes will be provided on a weekly basis, covering required topics. Participant will state understanding/return demonstration of topics presented.  Learning Barriers/Preferences:  Learning Barriers/Preferences - 06/05/23 0923       Learning Barriers/Preferences   Learning Barriers Sight   wears glasses   Learning Preferences Audio;Written Material;Skilled Demonstration;Group Instruction             Education Topics: Know Your Numbers Group instruction that is supported by a PowerPoint presentation. Instructor discusses importance of knowing and understanding resting, exercise, and post-exercise oxygen saturation, heart rate, and blood pressure. Oxygen saturation, heart rate, blood pressure, rating of perceived exertion, and dyspnea are reviewed along with a normal range for these values.    Exercise for the Pulmonary Patient Group instruction that is supported by a PowerPoint presentation. Instructor discusses benefits of exercise, core components of exercise, frequency, duration, and intensity of an exercise routine, importance of utilizing pulse oximetry during exercise, safety while exercising, and options of  places to exercise outside of rehab.    MET Level  Group instruction provided by PowerPoint, verbal discussion, and written material to support subject matter. Instructor reviews what METs are and how to increase METs.    Pulmonary Medications Verbally interactive group education provided by instructor with focus on inhaled medications and proper administration.   Anatomy and Physiology of the Respiratory System Group instruction provided by PowerPoint, verbal discussion, and written material to support subject matter. Instructor reviews respiratory cycle and anatomical components of the respiratory system and their functions. Instructor also reviews differences in obstructive and restrictive respiratory diseases with examples of each.    Oxygen Safety Group instruction provided by PowerPoint, verbal discussion, and written material to support subject matter. There is an overview of "What is Oxygen" and "Why do we need it".  Instructor also reviews how to create a safe environment for oxygen use, the importance of using oxygen as prescribed, and the risks of noncompliance. There is a brief discussion on traveling with oxygen and resources the patient may utilize.   Oxygen Use Group instruction provided by PowerPoint, verbal discussion, and written material to discuss how supplemental oxygen is prescribed and different types of oxygen supply systems. Resources for more information are provided.  Breathing Techniques Group instruction that is supported by demonstration and informational handouts. Instructor discusses the benefits of pursed lip and diaphragmatic breathing and detailed demonstration on how to perform both.     Risk Factor Reduction Group instruction that is supported by a PowerPoint presentation. Instructor discusses the definition of a risk factor, different risk factors for pulmonary disease, and how the heart and lungs work together.   Pulmonary Diseases Group  instruction provided by PowerPoint, verbal discussion, and written material to support subject matter. Instructor gives an overview of the different type of pulmonary diseases. There is also a discussion on risk factors and symptoms as well as ways to manage the diseases.   Stress and Energy Conservation Group instruction provided by PowerPoint, verbal discussion, and written material to support subject matter. Instructor gives an overview of stress and the impact it can have on the body. Instructor also reviews ways to reduce stress. There is also a discussion on energy conservation and ways to conserve energy throughout the day.   Warning Signs and Symptoms Group instruction provided by PowerPoint, verbal discussion, and written material to support subject matter. Instructor reviews warning signs and symptoms of stroke, heart attack, cold and flu. Instructor also reviews ways to prevent the spread of infection.   Other Education Group or individual verbal, written, or video instructions that support the educational goals of the pulmonary rehab program.    Knowledge Questionnaire Score:  Knowledge Questionnaire Score - 06/05/23 0916       Knowledge Questionnaire Score   Pre Score 16/18             Core Components/Risk Factors/Patient Goals at Admission:  Personal Goals and Risk Factors at Admission - 06/05/23 0923       Core Components/Risk Factors/Patient Goals on Admission    Weight Management Yes;Weight Loss    Intervention Weight Management: Develop a combined nutrition and exercise program designed to reach desired caloric intake, while maintaining appropriate intake of nutrient and fiber, sodium and fats, and appropriate energy expenditure required for the weight goal.;Weight Management: Provide education and appropriate resources to help participant work on and attain dietary goals.;Weight Management/Obesity: Establish reasonable short term and long term weight  goals.;Obesity: Provide education and appropriate resources to help participant work on and attain dietary goals.    Admit Weight 222 lb 3.6 oz (100.8 kg)    Expected Outcomes Short Term: Continue to assess and modify interventions until short term weight is achieved;Long Term: Adherence to nutrition and physical activity/exercise program aimed toward attainment of established weight goal;Weight Loss: Understanding of general recommendations for a balanced deficit meal plan, which promotes 1-2 lb weight loss per week and includes a negative energy balance of [phone] kcal/d;Understanding recommendations for meals to include 15-35% energy as protein, 25-35% energy from fat, 35-60% energy from carbohydrates, less than 200mg  of dietary cholesterol, 20-35 gm of total fiber daily;Understanding of distribution of calorie intake throughout the day with the consumption of 4-5 meals/snacks    Improve shortness of breath with ADL's Yes    Intervention Provide education, individualized exercise plan and daily activity instruction to help decrease symptoms of SOB with activities of daily living.    Expected Outcomes Short Term: Improve cardiorespiratory fitness to achieve a reduction of symptoms when performing ADLs;Long Term: Be able to perform more ADLs without symptoms or delay the onset of symptoms             Core Components/Risk Factors/Patient Goals Review:    Core Components/Risk Factors/Patient  Goals at Discharge (Final Review):    ITP Comments:   Comments: Dr. Genetta Kenning is Medical Director for Pulmonary Rehab at [facility].

## 2023-06-05 NOTE — Progress Notes (Signed)
 Paul Hardy 63 y.o. male Pulmonary Rehab Orientation Note This patient who was referred to Pulmonary Rehab by Dr. Waylan Haggard with the diagnosis of Centrilobular Emphysema arrived today in Cardiac and Pulmonary Rehab. He arrived ambulatory with normal gait. He does not carry portable oxygen. Per patient, Paul Hardy uses oxygen never. Color good, skin warm and dry. Patient is oriented to time and place. Patient's medical history, psychosocial health, and medications reviewed. Psychosocial assessment reveals patient lives with spouse. 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 Patient hobbies include  riding his motorcycle, lawn work, and volunteering with the rescue squad. . Patient reports his stress level is low. Areas of stress/anxiety include health. Patient does not exhibit signs of depression. PHQ2/9 score 0/2. Paul Hardy shows good  coping skills with positive outlook on life. Offered emotional support and reassurance. Will continue to monitor. Physical assessment performed by Nurse pick: Paul Harman RN. Please see their orientation physical assessment note. Paul Hardy reports he  does take medications as prescribed. Patient states he  follows a regular  diet. The pt reports he would like to lose weight. Patient's weight will be monitored closely. Demonstration and practice of PLB using pulse oximeter. Paul Hardy able to return demonstration satisfactorily. Safety and hand hygiene in the exercise area reviewed with patient. Paul Hardy voices understanding of the information reviewed. Department expectations discussed with patient and achievable goals were set. The patient shows enthusiasm about attending the program and we look forward to working with Paul Hardy. Paul Hardy completed a 6 min walk test today and is scheduled to begin exercise on 06/13/23 @8 :15.   [id number] [name], BSRT

## 2023-06-07 NOTE — Progress Notes (Signed)
 Pulmonary Individual Treatment Plan  Patient Details  Name: Paul Hardy MRN: [id number] Date of Birth: Jul 15, 1960 Referring Provider:   Doristine Devoid Pulmonary Rehab Walk Test from 06/05/2023 in [facility]  Referring Provider Mannam       Initial Encounter Date:  Flowsheet Row Pulmonary Rehab Walk Test from 06/05/2023 in [facility] for Heart, Vascular, [facility] Lung Health  Date 06/05/23       Visit Diagnosis: Centrilobular emphysema (HCC)  Patient's Home Medications on Admission:   Current Outpatient Medications:    albuterol (VENTOLIN HFA) 108 (90 Base) MCG/ACT inhaler, INHALE 2 PUFFS INTO THE LUNGS EVERY 6 HOURS AS NEEDED FOR WHEEZING (Patient taking differently: Inhale 2 puffs into the lungs every 6 (six) hours as needed for wheezing or shortness of breath.), Disp: 8.5 each, Rfl: 3   aspirin 81 MG chewable tablet, Chew 81 mg by mouth daily., Disp: , Rfl:    atorvastatin (LIPITOR) 80 MG tablet, Take 1 tablet (80 mg total) by mouth at bedtime., Disp: 90 tablet, Rfl: 3   benzonatate (TESSALON) 100 MG capsule, Take 1 capsule (100 mg total) by mouth every 8 (eight) hours., Disp: 21 capsule, Rfl: 0   Budeson-Glycopyrrol-Formoterol (BREZTRI AEROSPHERE) 160-9-4.8 MCG/ACT AERO, Inhale 2 puffs into the lungs 2 (two) times daily., Disp: 21.4 g, Rfl: 0   clopidogrel (PLAVIX) 75 MG tablet, Take 1 tablet (75 mg total) by mouth daily., Disp: 90 tablet, Rfl: 0   DULoxetine (CYMBALTA) 30 MG capsule, Take 1 capsule (30 mg total) by mouth daily., Disp: 90 capsule, Rfl: 2   EPINEPHrine 0.3 mg/0.3 mL IJ SOAJ injection, Inject 0.3 mg into the muscle as needed for anaphylaxis., Disp: , Rfl:    furosemide (LASIX) 40 MG tablet, Take 80 mg by mouth as needed for edema. 40 mg in the morning and 40mg  night, Disp: , Rfl:    gabapentin (NEURONTIN) 300 MG capsule, Take 1 capsule (300 mg total) by mouth at bedtime., Disp: 30 capsule,  Rfl: 3   KLOR-CON M20 20 MEQ tablet, Take 20 mEq by mouth as needed (take with lasix)., Disp: , Rfl:    metoprolol succinate (TOPROL-XL) 50 MG 24 hr tablet, Take 1 tablet (50 mg total) by mouth daily., Disp: 90 tablet, Rfl: 3   Multiple Vitamin (MULTIVITAMIN) tablet, Take 1 tablet by mouth daily., Disp: , Rfl:    nitroGLYCERIN (NITROSTAT) 0.4 MG SL tablet, Place 1 tablet (0.4 mg total) under the tongue every 5 (five) minutes as needed for chest pain., Disp: 25 tablet, Rfl: 0   omeprazole (PRILOSEC) 40 MG capsule, Take 40 mg by mouth daily., Disp: , Rfl:    ranolazine (RANEXA) 500 MG 12 hr tablet, Take 500 mg by mouth 2 (two) times daily., Disp: , Rfl:    SYMBICORT 160-4.5 MCG/ACT inhaler, INHALE 2 PUFFS INTO THE LUNGS IN THE MORNING AND AT BEDTIME, Disp: 30.6 each, Rfl: 1   tadalafil (CIALIS) 20 MG tablet, Take 20 mg by mouth as needed for erectile dysfunction., Disp: , Rfl:    tamsulosin (FLOMAX) 0.4 MG CAPS capsule, Take 0.4 mg by mouth daily., Disp: , Rfl:   Past Medical History: Past Medical History:  Diagnosis Date   CHF (congestive heart failure) (HCC)    Coronary artery disease    Hypertension     Tobacco Use: Social History   Tobacco Use  Smoking Status Former   Current packs/day: 0.00   Average packs/day: 1 pack/day for  22.8 years (22.8 ttl pk-yrs)   Types: Cigarettes   Start date: 02/21/1998   Quit date: 11/24/2020   Years since quitting: 2.5   Passive exposure: Never  Smokeless Tobacco Never    Labs: Review Flowsheet  More data exists      Latest Ref Rng & Units 10/21/2020 04/23/2021 09/28/2021 03/29/2022 08/12/2022  Labs for ITP Cardiac and Pulmonary Rehab  Cholestrol <200 mg/dL 536  644  - 034  742   LDL (calc) mg/dL (calc) 98  64  - 46  58   HDL-C > OR = 40 mg/dL 37  32  - 33  36   Trlycerides <150 mg/dL 595  638  - 756  433   Hemoglobin A1c <5.7 % of total Hgb - - 5.9  - -    Capillary Blood Glucose: No results found for: "GLUCAP"   Pulmonary Assessment  Scores:  Pulmonary Assessment Scores     Row Name 06/05/23 0918         ADL UCSD   ADL Phase Entry     SOB Score total 8       CAT Score   CAT Score 7       mMRC Score   mMRC Score 1             UCSD: Self-administered rating of dyspnea associated with activities of daily living (ADLs) 6-point scale (0 = "not at all" to 5 = "maximal or unable to do because of breathlessness")  Scoring Scores range from 0 to 120.  Minimally important difference is 5 units  CAT: CAT can identify the health impairment of COPD patients and is better correlated with disease progression.  CAT has a scoring range of zero to 40. The CAT score is classified into four groups of low (less than 10), medium (10 - 20), high (21-30) and very high (31-40) based on the impact level of disease on health status. A CAT score over 10 suggests significant symptoms.  A worsening CAT score could be explained by an exacerbation, poor medication adherence, poor inhaler technique, or progression of COPD or comorbid conditions.  CAT MCID is 2 points  mMRC: mMRC (Modified Medical Research Council) Dyspnea Scale is used to assess the degree of baseline functional disability in patients of respiratory disease due to dyspnea. No minimal important difference is established. A decrease in score of 1 point or greater is considered a positive change.   Pulmonary Function Assessment:  Pulmonary Function Assessment - 06/05/23 0926       Breath   Bilateral Breath Sounds Clear    Shortness of Breath Yes;Limiting activity             Exercise Target Goals: Exercise Program Goal: Individual exercise prescription set using results from initial 6 min walk test and THRR while considering  patient's activity barriers and safety.   Exercise Prescription Goal: Initial exercise prescription builds to 30-45 minutes a day of aerobic activity, 2-3 days per week.  Home exercise guidelines will be given to patient during program as  part of exercise prescription that the participant will acknowledge.  Activity Barriers & Risk Stratification:  Activity Barriers & Cardiac Risk Stratification - 06/05/23 0924       Activity Barriers & Cardiac Risk Stratification   Activity Barriers Muscular Weakness;Shortness of Breath;Deconditioning    Cardiac Risk Stratification Low             6 Minute Walk:  6 Minute Walk  Row Name 06/05/23 0924         6 Minute Walk   Phase Initial     Distance 1245 feet     Walk Time 6 minutes     # of Rest Breaks 0     MPH 0.94     METS 3.01     RPE 10.5     Perceived Dyspnea  0     VO2 Peak 10.55     Symptoms No     Resting HR 67 bpm     Resting BP 120/70     Resting Oxygen Saturation  95 %     Exercise Oxygen Saturation  during 6 min walk 93 %     Max Ex. HR 78 bpm     Max Ex. BP 134/80     2 Minute Post BP 118/74       Interval HR   1 Minute HR 74     2 Minute HR 74     3 Minute HR 77     4 Minute HR 78     5 Minute HR 77     6 Minute HR 77     2 Minute Post HR 68     Interval Heart Rate? Yes       Interval Oxygen   Interval Oxygen? Yes     Baseline Oxygen Saturation % 95 %     1 Minute Oxygen Saturation % 96 %     1 Minute Liters of Oxygen 0 L     2 Minute Oxygen Saturation % 93 %     2 Minute Liters of Oxygen 0 L     3 Minute Oxygen Saturation % 94 %     3 Minute Liters of Oxygen 0 L     4 Minute Oxygen Saturation % 94 %     4 Minute Liters of Oxygen 0 L     5 Minute Oxygen Saturation % 95 %     5 Minute Liters of Oxygen 0 L     6 Minute Oxygen Saturation % 96 %     6 Minute Liters of Oxygen 0 L     2 Minute Post Oxygen Saturation % 95 %     2 Minute Post Liters of Oxygen 0 L              Oxygen Initial Assessment:  Oxygen Initial Assessment - 06/05/23 0925       Home Oxygen   Home Oxygen Device None    Sleep Oxygen Prescription None    Home Exercise Oxygen Prescription None    Home Resting Oxygen Prescription None      Initial 6  min Walk   Oxygen Used None      Program Oxygen Prescription   Program Oxygen Prescription None      Intervention   Short Term Goals To learn and understand importance of maintaining oxygen saturations>88%;To learn and demonstrate proper use of respiratory medications;To learn and understand importance of monitoring SPO2 with pulse oximeter and demonstrate accurate use of the pulse oximeter.;To learn and demonstrate proper pursed lip breathing techniques or other breathing techniques.     Long  Term Goals Maintenance of O2 saturations>88%;Compliance with respiratory medication;Verbalizes importance of monitoring SPO2 with pulse oximeter and return demonstration;Exhibits proper breathing techniques, such as pursed lip breathing or other method taught during program session;Demonstrates proper use of MDI's  Oxygen Re-Evaluation:  Oxygen Re-Evaluation     Row Name 06/06/23 1535             Program Oxygen Prescription   Program Oxygen Prescription None         Home Oxygen   Home Oxygen Device None       Sleep Oxygen Prescription None       Home Exercise Oxygen Prescription None       Home Resting Oxygen Prescription None         Goals/Expected Outcomes   Short Term Goals To learn and understand importance of maintaining oxygen saturations>88%;To learn and demonstrate proper use of respiratory medications;To learn and understand importance of monitoring SPO2 with pulse oximeter and demonstrate accurate use of the pulse oximeter.;To learn and demonstrate proper pursed lip breathing techniques or other breathing techniques.        Long  Term Goals Maintenance of O2 saturations>88%;Compliance with respiratory medication;Verbalizes importance of monitoring SPO2 with pulse oximeter and return demonstration;Exhibits proper breathing techniques, such as pursed lip breathing or other method taught during program session;Demonstrates proper use of MDI's       Goals/Expected Outcomes  Compliance and understanding of oxygen saturation monitoring and breathing techniques to decrease shortness of breath.                Oxygen Discharge (Final Oxygen Re-Evaluation):  Oxygen Re-Evaluation - 06/06/23 1535       Program Oxygen Prescription   Program Oxygen Prescription None      Home Oxygen   Home Oxygen Device None    Sleep Oxygen Prescription None    Home Exercise Oxygen Prescription None    Home Resting Oxygen Prescription None      Goals/Expected Outcomes   Short Term Goals To learn and understand importance of maintaining oxygen saturations>88%;To learn and demonstrate proper use of respiratory medications;To learn and understand importance of monitoring SPO2 with pulse oximeter and demonstrate accurate use of the pulse oximeter.;To learn and demonstrate proper pursed lip breathing techniques or other breathing techniques.     Long  Term Goals Maintenance of O2 saturations>88%;Compliance with respiratory medication;Verbalizes importance of monitoring SPO2 with pulse oximeter and return demonstration;Exhibits proper breathing techniques, such as pursed lip breathing or other method taught during program session;Demonstrates proper use of MDI's    Goals/Expected Outcomes Compliance and understanding of oxygen saturation monitoring and breathing techniques to decrease shortness of breath.             Initial Exercise Prescription:  Initial Exercise Prescription - 06/05/23 0900       Date of Initial Exercise RX and Referring Provider   Date 06/05/23    Referring Provider Mannam    Expected Discharge Date 08/31/23      Treadmill   MPH 2.5    Grade 0    Minutes 15    METs 2.5      Elliptical   Level 1    Speed 1    Minutes 15    METs 3      Prescription Details   Frequency (times per week) 2    Duration Progress to 30 minutes of continuous aerobic without signs/symptoms of physical distress      Intensity   THRR 40-80% of Max Heartrate 63-126     Ratings of Perceived Exertion 11-13    Perceived Dyspnea 0-4      Progression   Progression Continue to progress workloads to maintain intensity without signs/symptoms of physical distress.  Resistance Training   Training Prescription Yes    Weight blue bands    Reps 10-15             Perform Capillary Blood Glucose checks as needed.  Exercise Prescription Changes:   Exercise Comments:   Exercise Goals and Review:   Exercise Goals     Row Name 06/05/23 0901             Exercise Goals   Increase Physical Activity Yes       Intervention Provide advice, education, support and counseling about physical activity/exercise needs.;Develop an individualized exercise prescription for aerobic and resistive training based on initial evaluation findings, risk stratification, comorbidities and participant's personal goals.       Expected Outcomes Short Term: Attend rehab on a regular basis to increase amount of physical activity.;Long Term: Exercising regularly at least 3-5 days a week.;Long Term: Add in home exercise to make exercise part of routine and to increase amount of physical activity.       Increase Strength and Stamina Yes       Intervention Provide advice, education, support and counseling about physical activity/exercise needs.;Develop an individualized exercise prescription for aerobic and resistive training based on initial evaluation findings, risk stratification, comorbidities and participant's personal goals.       Expected Outcomes Short Term: Increase workloads from initial exercise prescription for resistance, speed, and METs.;Short Term: Perform resistance training exercises routinely during rehab and add in resistance training at home;Long Term: Improve cardiorespiratory fitness, muscular endurance and strength as measured by increased METs and functional capacity ( )       Able to understand and use rate of perceived exertion (RPE) scale Yes        Intervention Provide education and explanation on how to use RPE scale       Expected Outcomes Short Term: Able to use RPE daily in rehab to express subjective intensity level;Long Term:  Able to use RPE to guide intensity level when exercising independently       Able to understand and use Dyspnea scale Yes       Intervention Provide education and explanation on how to use Dyspnea scale       Expected Outcomes Short Term: Able to use Dyspnea scale daily in rehab to express subjective sense of shortness of breath during exertion;Long Term: Able to use Dyspnea scale to guide intensity level when exercising independently       Knowledge and understanding of Target Heart Rate Range (THRR) Yes       Intervention Provide education and explanation of THRR including how the numbers were predicted and where they are located for reference       Expected Outcomes Short Term: Able to state/look up THRR;Long Term: Able to use THRR to govern intensity when exercising independently;Short Term: Able to use daily as guideline for intensity in rehab       Understanding of Exercise Prescription Yes       Intervention Provide education, explanation, and written materials on patient's individual exercise prescription       Expected Outcomes Short Term: Able to explain program exercise prescription;Long Term: Able to explain home exercise prescription to exercise independently                Exercise Goals Re-Evaluation :  Exercise Goals Re-Evaluation     Row Name 06/06/23 1533             Exercise Goal Re-Evaluation   Exercise Goals Review  Increase Physical Activity;Able to understand and use Dyspnea scale;Understanding of Exercise Prescription;Increase Strength and Stamina;Knowledge and understanding of Target Heart Rate Range (THRR);Able to understand and use rate of perceived exertion (RPE) scale       Comments Paul Hardy is scheduled to begin exercise on 4/22. Will continue to monitor and progress as able.        Expected Outcomes Through exercise at rehab and home, the patient will decrease shortness of breath with daily activities and feel confident in carrying out an exercise regimen at home.                Discharge Exercise Prescription (Final Exercise Prescription Changes):   Nutrition:  Target Goals: Understanding of nutrition guidelines, daily intake of sodium 1500mg , cholesterol 200mg , calories 30% from fat and 7% or less from saturated fats, daily to have 5 or more servings of fruits and vegetables.  Biometrics:  Pre Biometrics - 06/05/23 0900       Pre Biometrics   Grip Strength 42 kg              Nutrition Therapy Plan and Nutrition Goals:   Nutrition Assessments:  MEDIFICTS Score Key: >=70 Need to make dietary changes  40-70 Heart Healthy Diet <= 40 Therapeutic Level Cholesterol Diet   Picture Your Plate Scores: <40 Unhealthy dietary pattern with much room for improvement. 41-50 Dietary pattern unlikely to meet recommendations for good health and room for improvement. 51-60 More healthful dietary pattern, with some room for improvement.  >60 Healthy dietary pattern, although there may be some specific behaviors that could be improved.    Nutrition Goals Re-Evaluation:   Nutrition Goals Discharge (Final Nutrition Goals Re-Evaluation):   Psychosocial: Target Goals: Acknowledge presence or absence of significant depression and/or stress, maximize coping skills, provide positive support system. Participant is able to verbalize types and ability to use techniques and skills needed for reducing stress and depression.  Initial Review & Psychosocial Screening:  Initial Psych Review & Screening - 06/05/23 0922       Initial Review   Current issues with History of Depression;Current Psychotropic Meds      Family Dynamics   Good Support System? Yes      Barriers   Psychosocial barriers to participate in program There are no identifiable barriers or  psychosocial needs.      Screening Interventions   Interventions Encouraged to exercise             Quality of Life Scores:  Scores of 19 and below usually indicate a poorer quality of life in these areas.  A difference of  2-3 points is a clinically meaningful difference.  A difference of 2-3 points in the total score of the Quality of Life Index has been associated with significant improvement in overall quality of life, self-image, physical symptoms, and general health in studies assessing change in quality of life.  PHQ-9: Review Flowsheet  More data exists      06/05/2023 03/16/2023 08/12/2022 03/14/2022 12/09/2021  Depression screen PHQ 2/9  Decreased Interest 0 0 0 0 1  Down, Depressed, Hopeless 0 0 0 0 0  PHQ - 2 Score 0 0 0 0 1  Altered sleeping 1 - - - 1  Tired, decreased energy 1 - - - 1  Change in appetite 0 - - - 0  Feeling bad or failure about yourself  0 - - - 0  Trouble concentrating 0 - - - 0  Moving slowly or fidgety/restless 0 - - -  0  Suicidal thoughts 0 - - - 0  PHQ-9 Score 2 - - - 3  Difficult doing work/chores Not difficult at all - - - Somewhat difficult   Interpretation of Total Score  Total Score Depression Severity:  1-4 = Minimal depression, 5-9 = Mild depression, 10-14 = Moderate depression, 15-19 = Moderately severe depression, 20-27 = Severe depression   Psychosocial Evaluation and Intervention:  Psychosocial Evaluation - 06/05/23 0945       Psychosocial Evaluation & Interventions   Interventions Encouraged to exercise with the program and follow exercise prescription    Comments Paul denies any psychosocial barriers or concerns at this time    Expected Outcomes For [place] to participate in PR free of any psychosocial barriers or concerns    Continue Psychosocial Services  No Follow up required             Psychosocial Re-Evaluation:  Psychosocial Re-Evaluation     Row Name 06/06/23 1520             Psychosocial Re-Evaluation    Current issues with History of Depression;Current Psychotropic Meds       Comments Paul Hardy is scheduled to start PR on 4/22. No new barriers or concerns since orientation on 4/14.       Expected Outcomes For Paul Hardy to participate in PR free of any psychosocial barriers or concerns       Continue Psychosocial Services  No Follow up required                Psychosocial Discharge (Final Psychosocial Re-Evaluation):  Psychosocial Re-Evaluation - 06/06/23 1520       Psychosocial Re-Evaluation   Current issues with History of Depression;Current Psychotropic Meds    Comments Paul Hardy is scheduled to start PR on 4/22. No new barriers or concerns since orientation on 4/14.    Expected Outcomes For Paul Hardy to participate in PR free of any psychosocial barriers or concerns    Continue Psychosocial Services  No Follow up required             Education: Education Goals: Education classes will be provided on a weekly basis, covering required topics. Participant will state understanding/return demonstration of topics presented.  Learning Barriers/Preferences:  Learning Barriers/Preferences - 06/05/23 0923       Learning Barriers/Preferences   Learning Barriers Sight   wears glasses   Learning Preferences Audio;Written Material;Skilled Demonstration;Group Instruction             Education Topics: Know Your Numbers Group instruction that is supported by a PowerPoint presentation. Instructor discusses importance of knowing and understanding resting, exercise, and post-exercise oxygen saturation, heart rate, and blood pressure. Oxygen saturation, heart rate, blood pressure, rating of perceived exertion, and dyspnea are reviewed along with a normal range for these values.    Exercise for the Pulmonary Patient Group instruction that is supported by a PowerPoint presentation. Instructor discusses benefits of exercise, core components of exercise, frequency, duration, and intensity of an  exercise routine, importance of utilizing pulse oximetry during exercise, safety while exercising, and options of places to exercise outside of rehab.    MET Level  Group instruction provided by PowerPoint, verbal discussion, and written material to support subject matter. Instructor reviews what METs are and how to increase METs.    Pulmonary Medications Verbally interactive group education provided by instructor with focus on inhaled medications and proper administration.   Anatomy and Physiology of the Respiratory System Group instruction provided by PowerPoint, verbal discussion, and  written material to support subject matter. Instructor reviews respiratory cycle and anatomical components of the respiratory system and their functions. Instructor also reviews differences in obstructive and restrictive respiratory diseases with examples of each.    Oxygen Safety Group instruction provided by PowerPoint, verbal discussion, and written material to support subject matter. There is an overview of "What is Oxygen" and "Why do we need it".  Instructor also reviews how to create a safe environment for oxygen use, the importance of using oxygen as prescribed, and the risks of noncompliance. There is a brief discussion on traveling with oxygen and resources the patient may utilize.   Oxygen Use Group instruction provided by PowerPoint, verbal discussion, and written material to discuss how supplemental oxygen is prescribed and different types of oxygen supply systems. Resources for more information are provided.    Breathing Techniques Group instruction that is supported by demonstration and informational handouts. Instructor discusses the benefits of pursed lip and diaphragmatic breathing and detailed demonstration on how to perform both.     Risk Factor Reduction Group instruction that is supported by a PowerPoint presentation. Instructor discusses the definition of a risk factor, different  risk factors for pulmonary disease, and how the heart and lungs work together.   Pulmonary Diseases Group instruction provided by PowerPoint, verbal discussion, and written material to support subject matter. Instructor gives an overview of the different type of pulmonary diseases. There is also a discussion on risk factors and symptoms as well as ways to manage the diseases.   Stress and Energy Conservation Group instruction provided by PowerPoint, verbal discussion, and written material to support subject matter. Instructor gives an overview of stress and the impact it can have on the body. Instructor also reviews ways to reduce stress. There is also a discussion on energy conservation and ways to conserve energy throughout the day.   Warning Signs and Symptoms Group instruction provided by PowerPoint, verbal discussion, and written material to support subject matter. Instructor reviews warning signs and symptoms of stroke, heart attack, cold and flu. Instructor also reviews ways to prevent the spread of infection.   Other Education Group or individual verbal, written, or video instructions that support the educational goals of the pulmonary rehab program.    Knowledge Questionnaire Score:  Knowledge Questionnaire Score - 06/05/23 0916       Knowledge Questionnaire Score   Pre Score 16/18             Core Components/Risk Factors/Patient Goals at Admission:  Personal Goals and Risk Factors at Admission - 06/05/23 0923       Core Components/Risk Factors/Patient Goals on Admission    Weight Management Yes;Weight Loss    Intervention Weight Management: Develop a combined nutrition and exercise program designed to reach desired caloric intake, while maintaining appropriate intake of nutrient and fiber, sodium and fats, and appropriate energy expenditure required for the weight goal.;Weight Management: Provide education and appropriate resources to help participant work on and  attain dietary goals.;Weight Management/Obesity: Establish reasonable short term and long term weight goals.;Obesity: Provide education and appropriate resources to help participant work on and attain dietary goals.    Admit Weight 222 lb 3.6 oz (100.8 kg)    Expected Outcomes Short Term: Continue to assess and modify interventions until short term weight is achieved;Long Term: Adherence to nutrition and physical activity/exercise program aimed toward attainment of established weight goal;Weight Loss: Understanding of general recommendations for a balanced deficit meal plan, which promotes 1-2 lb weight loss per  week and includes a negative energy balance of [phone] kcal/d;Understanding recommendations for meals to include 15-35% energy as protein, 25-35% energy from fat, 35-60% energy from carbohydrates, less than 200mg  of dietary cholesterol, 20-35 gm of total fiber daily;Understanding of distribution of calorie intake throughout the day with the consumption of 4-5 meals/snacks    Improve shortness of breath with ADL's Yes    Intervention Provide education, individualized exercise plan and daily activity instruction to help decrease symptoms of SOB with activities of daily living.    Expected Outcomes Short Term: Improve cardiorespiratory fitness to achieve a reduction of symptoms when performing ADLs;Long Term: Be able to perform more ADLs without symptoms or delay the onset of symptoms             Core Components/Risk Factors/Patient Goals Review:   Goals and Risk Factor Review     Row Name 06/06/23 1523             Core Components/Risk Factors/Patient Goals Review   Personal Goals Review Weight Management/Obesity;Improve shortness of breath with ADL's;Develop more efficient breathing techniques such as purse lipped breathing and diaphragmatic breathing and practicing self-pacing with activity.       Review Paul Hardy has not started PR yet. Unable to assess his goals at this time.        Expected Outcomes To improve shortness of breath with ADL's, develop more efficient breathing techniques such as purse lipped breathing and diaphragmatic breathing; and practicing self-pacing with activity and lose weight.                Core Components/Risk Factors/Patient Goals at Discharge (Final Review):   Goals and Risk Factor Review - 06/06/23 1523       Core Components/Risk Factors/Patient Goals Review   Personal Goals Review Weight Management/Obesity;Improve shortness of breath with ADL's;Develop more efficient breathing techniques such as purse lipped breathing and diaphragmatic breathing and practicing self-pacing with activity.    Review Paul Hardy has not started PR yet. Unable to assess his goals at this time.    Expected Outcomes To improve shortness of breath with ADL's, develop more efficient breathing techniques such as purse lipped breathing and diaphragmatic breathing; and practicing self-pacing with activity and lose weight.             ITP Comments:Pt is making expected progress toward Pulmonary Rehab goals after completing 0 session(s). Recommend continued exercise, life style modification, education, and utilization of breathing techniques to increase stamina and strength, while also decreasing shortness of breath with exertion.  Dr. Genetta Kenning is Medical Director for Pulmonary Rehab at [facility].     Comments:

## 2023-06-08 ENCOUNTER — Ambulatory Visit (HOSPITAL_COMMUNITY)

## 2023-06-10 ENCOUNTER — Other Ambulatory Visit: Payer: Self-pay | Admitting: Family Medicine

## 2023-06-10 ENCOUNTER — Encounter: Payer: Self-pay | Admitting: Medical-Surgical

## 2023-06-10 DIAGNOSIS — J411 Mucopurulent chronic bronchitis: Secondary | ICD-10-CM

## 2023-06-10 DIAGNOSIS — J441 Chronic obstructive pulmonary disease with (acute) exacerbation: Secondary | ICD-10-CM

## 2023-06-12 ENCOUNTER — Other Ambulatory Visit (HOSPITAL_COMMUNITY): Payer: Self-pay

## 2023-06-12 ENCOUNTER — Other Ambulatory Visit: Payer: Self-pay

## 2023-06-12 MED ORDER — BREZTRI AEROSPHERE 160-9-4.8 MCG/ACT IN AERO
2.0000 | INHALATION_SPRAY | Freq: Two times a day (BID) | RESPIRATORY_TRACT | 0 refills | Status: DC
  Filled 2023-06-12: qty 21.4, 60d supply, fill #0

## 2023-06-12 MED ORDER — GABAPENTIN 300 MG PO CAPS
300.0000 mg | ORAL_CAPSULE | Freq: Every day | ORAL | 3 refills | Status: DC
  Filled 2023-06-12: qty 30, 30d supply, fill #0
  Filled 2023-07-07: qty 30, 30d supply, fill #1
  Filled 2023-08-06: qty 30, 30d supply, fill #2

## 2023-06-13 ENCOUNTER — Ambulatory Visit (HOSPITAL_COMMUNITY)

## 2023-06-13 ENCOUNTER — Encounter (HOSPITAL_COMMUNITY)
Admission: RE | Admit: 2023-06-13 | Discharge: 2023-06-13 | Disposition: A | Discharge status: Home / Self Care | Source: Ambulatory Visit | Attending: Pulmonary Disease | Admitting: Pulmonary Disease

## 2023-06-13 DIAGNOSIS — J432 Centrilobular emphysema: Secondary | ICD-10-CM | POA: Diagnosis not present

## 2023-06-13 NOTE — Progress Notes (Signed)
 Daily Session Note  Patient Details  Name: Paul Hardy MRN: [id number] Date of Birth: Dec 26, 1960 Referring Provider:   Gattis Kass Pulmonary Rehab Walk Test from 06/05/2023 in [facility]  Referring Provider Mannam       Encounter Date: 06/13/2023  Check In:  Session Check In - 06/13/23 0820       Check-In   Supervising physician immediately available to respond to emergencies [facility] MD immediately available    Physician(s) Marlana Silvan, NP    Location [facility]    Staff Present Cindra Cree, Emilio Harder, MS, ACSM-CEP, Exercise Physiologist;Randi Rochelle Chu, ACSM-CEP, Exercise Physiologist    Virtual Visit No    Medication changes reported     No    Fall or balance concerns reported    No    Tobacco Cessation No Change    Warm-up and Cool-down Performed as group-led instruction    Resistance Training Performed Yes    VAD Patient? No    PAD/SET Patient? No      Pain Assessment   Currently in Pain? No/denies    Multiple Pain Sites No             Capillary Blood Glucose: No results found for this or any previous visit (from the past 24 hours).    Social History   Tobacco Use  Smoking Status Former   Current packs/day: 0.00   Average packs/day: 1 pack/day for 22.8 years (22.8 ttl pk-yrs)   Types: Cigarettes   Start date: 02/21/1998   Quit date: 11/24/2020   Years since quitting: 2.5   Passive exposure: Never  Smokeless Tobacco Never    Goals Met:  Proper associated with RPD/PD & O2 Sat Exercise tolerated well No report of concerns or symptoms today Strength training completed today  Goals Unmet:  Not Applicable  Comments: Service time is from 0810 to 0926.    Dr. Genetta Kenning is Medical Director for Pulmonary Rehab at [facility].

## 2023-06-15 ENCOUNTER — Ambulatory Visit (HOSPITAL_COMMUNITY)

## 2023-06-15 ENCOUNTER — Encounter (HOSPITAL_COMMUNITY): Admission: RE | Admit: 2023-06-15 | Source: Ambulatory Visit

## 2023-06-15 ENCOUNTER — Telehealth (HOSPITAL_COMMUNITY): Payer: Self-pay | Admitting: *Deleted

## 2023-06-15 NOTE — Telephone Encounter (Signed)
 Pt LVM stating he doesn't feel well today and will not be able to attend PR today.  German Koller BS, ACSM-CEP 06/15/2023 7:47 AM

## 2023-06-16 ENCOUNTER — Other Ambulatory Visit: Payer: Self-pay

## 2023-06-16 ENCOUNTER — Encounter: Payer: Self-pay | Admitting: Urology

## 2023-06-16 ENCOUNTER — Ambulatory Visit: Admitting: Urology

## 2023-06-16 ENCOUNTER — Other Ambulatory Visit (HOSPITAL_COMMUNITY): Payer: Self-pay

## 2023-06-16 VITALS — BP 141/81 | HR 71 | Ht 73.0 in | Wt 212.0 lb

## 2023-06-16 DIAGNOSIS — N281 Cyst of kidney, acquired: Secondary | ICD-10-CM | POA: Diagnosis not present

## 2023-06-16 DIAGNOSIS — N138 Other obstructive and reflux uropathy: Secondary | ICD-10-CM

## 2023-06-16 DIAGNOSIS — N401 Enlarged prostate with lower urinary tract symptoms: Secondary | ICD-10-CM | POA: Diagnosis not present

## 2023-06-16 LAB — URINALYSIS, ROUTINE W REFLEX MICROSCOPIC
Bilirubin, UA: NEGATIVE
Glucose, UA: NEGATIVE
Ketones, UA: NEGATIVE
Leukocytes,UA: NEGATIVE
Nitrite, UA: NEGATIVE
Protein,UA: NEGATIVE
RBC, UA: NEGATIVE
Specific Gravity, UA: 1.01 (ref 1.005–1.030)
Urobilinogen, Ur: 1 mg/dL (ref 0.2–1.0)
pH, UA: 6 (ref 5.0–7.5)

## 2023-06-16 LAB — BLADDER SCAN AMB NON-IMAGING

## 2023-06-16 MED ORDER — ALFUZOSIN HCL ER 10 MG PO TB24
10.0000 mg | ORAL_TABLET | Freq: Every day | ORAL | 11 refills | Status: DC
  Filled 2023-06-16: qty 30, 30d supply, fill #0
  Filled 2023-07-07 – 2023-07-11 (×2): qty 30, 30d supply, fill #1
  Filled 2023-08-10: qty 30, 30d supply, fill #2
  Filled 2023-08-17 – 2023-09-09 (×2): qty 30, 30d supply, fill #3
  Filled 2023-10-07: qty 30, 30d supply, fill #4

## 2023-06-16 NOTE — Progress Notes (Signed)
 Assessment: 1. BPH with obstruction/lower urinary tract symptoms   2. 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  Rx sent. D/C tamsulosin  No additional evaluation for the bilateral renal cyst indicated. Return to office in 6-8 weeks.  Chief Complaint: Chief Complaint  Patient presents with   Benign Prostatic Hypertrophy    HPI: Paul Hardy is a 63 y.o. male who presents for continued evaluation of a right renal cyst and BPH with LUTS. He was found to have a small minimally complex right renal cyst. 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 Patient has pmhx of CAD s/p CABG, hypertension, dyslipidemia, obstructive sleep apnea, cannot tolerate CPAP mask.     Patient also has stage III CKD with recent creatinine 1.62/GFR = 48   Patient was found to have a 2.1 cm minimally complex right renal cystic lesion classified as Bosniak 7F in October 2023.  Also noted to have a 10mm left nonobstructing renal calculus.  He saw a urologist at that time at [facility] who recommended ongoing follow-up given its classification.  MRI abdomen with and without contrast from 2/24 showed small Bosniak category 1 and 2 cyst in both kidneys.  No additional follow-up for the cyst was recommended. CT abdomen and pelvis without contrast from 225 for evaluation abdominal pain showed a 10 mm left renal calculus without obstruction and stable bilateral renal cyst.  He has a history of BPH with lower urinary tract symptoms and has been managed with tamsulosin . PSA from 6/24: 0.96  He returns today for follow-up.  He continues on tamsulosin .  He reports some increase in his urinary symptoms.  His lower urinary tract symptoms are intermittent in nature.  He does have frequency after taking his Lasix in the morning.  He reports difficulty with a slow stream.  No dysuria or gross hematuria. IPSS = 13/3.  Portions of the above documentation were copied from a prior visit for review purposes  only.  Allergies: Allergies  Allergen Reactions   Bee Venom Anaphylaxis   Iodinated Contrast Media Anaphylaxis    Rash and Shortness of Breath   Hydrochlorothiazide Other (See Comments)    Dehydration and muscle spasms. Lasix does the same   Lisinopril Other (See Comments) and Cough    Losartan  is OK    PMH: Past Medical History:  Diagnosis Date   CHF (congestive heart failure) (HCC)    Coronary artery disease    Hypertension     PSH: Past Surgical History:  Procedure Laterality Date   CARDIAC SURGERY     CHOLECYSTECTOMY, LAPAROSCOPIC     VASECTOMY      SH: Social History   Tobacco Use   Smoking status: Former    Current packs/day: 0.00    Average packs/day: 1 pack/day for 22.8 years (22.8 ttl pk-yrs)    Types: Cigarettes    Start date: 02/21/1998    Quit date: 11/24/2020    Years since quitting: 2.5    Passive exposure: Never   Smokeless tobacco: Never  Vaping Use   Vaping status: Never Used  Substance Use Topics   Alcohol use: Yes    Comment: social drinker   Drug use: Never    ROS: Constitutional:  Negative for fever, chills, weight loss CV: Negative for chest pain, previous MI, hypertension Respiratory:  Negative for shortness of breath, wheezing, sleep apnea, frequent cough GI:  Negative for nausea, vomiting, bloody stool, GERD  PE: BP (!) 141/81  Pulse 71   Ht 6\' 1"  (1.854 m)   Wt 212 lb (96.2 kg)   BMI 27.97 kg/m  GENERAL APPEARANCE:  Well appearing, well developed, well nourished, NAD HEENT:  Atraumatic, normocephalic, oropharynx clear NECK:  Supple without lymphadenopathy or thyromegaly ABDOMEN:  Soft, non-tender, no masses EXTREMITIES:  Moves all extremities well, without clubbing, cyanosis, or edema NEUROLOGIC:  Alert and oriented x 3, normal gait, CN II-XII grossly intact MENTAL STATUS:  appropriate BACK:  Non-tender to palpation, No CVAT SKIN:  Warm, dry, and intact GU: Prostate: 40 g, NT, no nodules Rectum: Normal tone,  no masses  or tenderness    Results: U/A: Negative  PVR = 39 ml

## 2023-06-20 ENCOUNTER — Encounter (HOSPITAL_COMMUNITY)
Admission: RE | Admit: 2023-06-20 | Discharge: 2023-06-20 | Disposition: A | Discharge status: Home / Self Care | Source: Ambulatory Visit | Attending: Pulmonary Disease

## 2023-06-20 ENCOUNTER — Ambulatory Visit (HOSPITAL_COMMUNITY)

## 2023-06-20 VITALS — Wt 222.2 lb

## 2023-06-20 DIAGNOSIS — J432 Centrilobular emphysema: Secondary | ICD-10-CM | POA: Diagnosis not present

## 2023-06-20 NOTE — Progress Notes (Signed)
 Daily Session Note  Patient Details  Name: Kailo Alen MRN: [id number] Date of Birth: [birth year] Referring Provider:   Gattis Kass Pulmonary Rehab Walk Test from 06/05/2023 in [facility] for Heart, Vascular, & Lung Health  Referring Provider Mannam       Encounter Date: 06/20/2023  Check In:  Session Check In - 06/20/23 0820       Check-In   Supervising physician immediately available to respond to emergencies [facility] MD immediately available    Physician(s) Lawana Pray, NP    Location [facility]    Staff Present Cindra Cree, Emilio Harder, MS, ACSM-CEP, Exercise Physiologist;Randi Rochelle Chu, ACSM-CEP, Exercise Physiologist;Mary Arlester Ladd, RN, BSN    Virtual Visit No    Medication changes reported     No    Fall or balance concerns reported    No    Tobacco Cessation No Change    Warm-up and Cool-down Performed as group-led instruction    Resistance Training Performed Yes    VAD Patient? No    PAD/SET Patient? No      Pain Assessment   Currently in Pain? No/denies    Multiple Pain Sites No             Capillary Blood Glucose: No results found for this or any previous visit (from the past 24 hours).   Exercise Prescription Changes - 06/20/23 0900       Response to Exercise   Blood Pressure (Admit) 126/82    Blood Pressure (Exercise) 142/80    Blood Pressure (Exit) 108/60    Heart Rate (Admit) 68 bpm    Heart Rate (Exercise) 77 bpm    Heart Rate (Exit) 74 bpm    Oxygen Saturation (Admit) 96 %    Oxygen Saturation (Exercise) 97 %    Oxygen Saturation (Exit) 96 %    Rating of Perceived Exertion (Exercise) 10    Perceived Dyspnea (Exercise) 1      Progression   Progression Continue to progress workloads to maintain intensity without signs/symptoms of physical distress.      Resistance Training   Training Prescription Yes    Weight black bands    Reps 10-15    Time 10 Minutes      Treadmill   MPH 2.5    Grade 1     Minutes 15    METs 3.2      Elliptical   Level 1    Speed 1    Minutes 15    METs 4             Social History   Tobacco Use  Smoking Status Former   Current packs/day: 0.00   Average packs/day: 1 pack/day for 22.8 years (22.8 ttl pk-yrs)   Types: Cigarettes   Start date: 02/21/1998   Quit date: 11/24/2020   Years since quitting: 2.5   Passive exposure: Never  Smokeless Tobacco Never    Goals Met:  Proper associated with RPD/PD & O2 Sat Independence with exercise equipment Exercise tolerated well No report of concerns or symptoms today Strength training completed today  Goals Unmet:  Not Applicable  Comments: Service time is from 0810 to 0920.    Dr. Genetta Kenning is Medical Director for Pulmonary Rehab at [facility].

## 2023-06-21 ENCOUNTER — Telehealth (HOSPITAL_COMMUNITY): Payer: Self-pay

## 2023-06-21 NOTE — Telephone Encounter (Signed)
 No response from pt in regards to pulmonary rehab. Closed referral.

## 2023-06-22 ENCOUNTER — Encounter (HOSPITAL_COMMUNITY)

## 2023-06-22 ENCOUNTER — Ambulatory Visit (HOSPITAL_COMMUNITY)

## 2023-06-22 ENCOUNTER — Telehealth (HOSPITAL_COMMUNITY): Payer: Self-pay

## 2023-06-22 NOTE — Telephone Encounter (Signed)
 Pt called out due to work commitments

## 2023-06-23 ENCOUNTER — Ambulatory Visit: Admitting: Cardiology

## 2023-06-27 ENCOUNTER — Encounter (HOSPITAL_COMMUNITY)
Admission: RE | Admit: 2023-06-27 | Discharge: 2023-06-27 | Disposition: A | Discharge status: Home / Self Care | Source: Ambulatory Visit | Attending: Pulmonary Disease | Admitting: Pulmonary Disease

## 2023-06-27 ENCOUNTER — Ambulatory Visit (HOSPITAL_COMMUNITY)

## 2023-06-27 ENCOUNTER — Telehealth (HOSPITAL_COMMUNITY): Payer: Self-pay | Admitting: *Deleted

## 2023-06-27 DIAGNOSIS — J432 Centrilobular emphysema: Secondary | ICD-10-CM | POA: Insufficient documentation

## 2023-06-29 ENCOUNTER — Ambulatory Visit (HOSPITAL_COMMUNITY)

## 2023-06-29 ENCOUNTER — Ambulatory Visit

## 2023-06-29 ENCOUNTER — Telehealth (HOSPITAL_COMMUNITY): Payer: Self-pay

## 2023-06-29 ENCOUNTER — Other Ambulatory Visit (HOSPITAL_COMMUNITY): Payer: Self-pay

## 2023-06-29 ENCOUNTER — Encounter (HOSPITAL_COMMUNITY): Admission: RE | Admit: 2023-06-29 | Source: Ambulatory Visit

## 2023-06-29 ENCOUNTER — Other Ambulatory Visit: Payer: Self-pay

## 2023-06-29 ENCOUNTER — Ambulatory Visit (INDEPENDENT_AMBULATORY_CARE_PROVIDER_SITE_OTHER): Admitting: Sports Medicine

## 2023-06-29 DIAGNOSIS — M47816 Spondylosis without myelopathy or radiculopathy, lumbar region: Secondary | ICD-10-CM | POA: Diagnosis not present

## 2023-06-29 DIAGNOSIS — M25551 Pain in right hip: Secondary | ICD-10-CM

## 2023-06-29 MED ORDER — MELOXICAM 15 MG PO TABS
15.0000 mg | ORAL_TABLET | Freq: Every day | ORAL | 3 refills | Status: DC
  Filled 2023-06-29: qty 30, 30d supply, fill #0
  Filled 2023-07-07 – 2023-07-24 (×2): qty 30, 30d supply, fill #1
  Filled 2023-08-17: qty 30, 30d supply, fill #2
  Filled 2023-09-09 – 2023-09-22 (×2): qty 30, 30d supply, fill #3

## 2023-06-29 NOTE — Assessment & Plan Note (Signed)
 Right-sided pain, gabapentin  helped the burning foot component of his pain. On exam there is no pain with hip internal rotation, there really is not any concordant tenderness of the greater trochanter, pain is predominantly right-sided paralumbar. I did personally review a CT abdomen pelvis with close attention paid to the spine, he does have a dominant finding of right L5-S1 facet arthritis. We discussed the anatomy and pathology of lumbar disc disease. We will add meloxicam, formal therapy, dedicated lumbar spine and hip x-rays. Return to see me in about 6 weeks, MR for interventional planning if not better.

## 2023-06-29 NOTE — Telephone Encounter (Signed)
 Pt left message to call back.  [place]. He is having pain his right hip. He is seeing an orthopedic doctor and will return when he is advised to. I asked for him to get a letter from the orthopedic doctor to return.

## 2023-06-29 NOTE — Progress Notes (Signed)
    Procedures performed today:    None.  Independent interpretation of notes and tests performed by another provider:   None.  Brief History, Exam, Impression, and Recommendations:    Lumbar spondylosis Right-sided pain, gabapentin  helped the burning foot component of his pain. On exam there is no pain with hip internal rotation, there really is not any concordant tenderness of the greater trochanter, pain is predominantly right-sided paralumbar. I did personally review a CT abdomen pelvis with close attention paid to the spine, he does have a dominant finding of right L5-S1 facet arthritis. We discussed the anatomy and pathology of lumbar disc disease. We will add meloxicam, formal therapy, dedicated lumbar spine and hip x-rays. Return to see me in about 6 weeks, MR for interventional planning if not better.    ____________________________________________ Joselyn Nicely. Sandy Crumb, M.D., ABFM., CAQSM., AME. Primary Care and Sports Medicine [facility]  Adjunct Professor of [facility] Medicine  [employer]  Restaurant manager, fast food

## 2023-07-04 ENCOUNTER — Encounter (HOSPITAL_COMMUNITY): Admission: RE | Admit: 2023-07-04 | Source: Ambulatory Visit

## 2023-07-04 ENCOUNTER — Encounter: Payer: Self-pay | Admitting: Cardiology

## 2023-07-04 ENCOUNTER — Ambulatory Visit (HOSPITAL_COMMUNITY)

## 2023-07-04 ENCOUNTER — Ambulatory Visit: Attending: Cardiology | Admitting: Cardiology

## 2023-07-04 VITALS — BP 108/70 | HR 71 | Ht 73.0 in | Wt 226.0 lb

## 2023-07-04 DIAGNOSIS — I255 Ischemic cardiomyopathy: Secondary | ICD-10-CM

## 2023-07-04 DIAGNOSIS — E785 Hyperlipidemia, unspecified: Secondary | ICD-10-CM

## 2023-07-04 DIAGNOSIS — J449 Chronic obstructive pulmonary disease, unspecified: Secondary | ICD-10-CM

## 2023-07-04 DIAGNOSIS — I10 Essential (primary) hypertension: Secondary | ICD-10-CM | POA: Diagnosis not present

## 2023-07-04 DIAGNOSIS — Z951 Presence of aortocoronary bypass graft: Secondary | ICD-10-CM

## 2023-07-04 NOTE — Progress Notes (Unsigned)
 Cardiology Office Note:    Date:  07/04/2023   ID:  Paul Hardy, DOB Jan 28, 1961, MRN [id number]  PCP:  Adela Holter, DO  Cardiologist:  Ralene Burger, MD    Referring MD: Adela Holter, DO   Chief Complaint  Patient presents with   Follow-up         History of Present Illness:    Paul Hardy is a 63 y.o. male past medical history significant for coronary artery disease, status post coronary bypass graft with LIMA to LAD SVG to RCA SVG obtuse marginal branch that was done in 2022 couple months later he required another cardiac catheterization was found to have occluded 1 graft.  Additional problem include cardiomyopathy which is ischemic in origin likely normalization, essential hypertension, dyslipidemia, obstructive sleep apnea but cannot tolerate CPAP mask.  Comes today 2 months for follow-up since I seen him last time he had him going to the hospital because of bowel obstruction likely recovery.  He denies have any chest pain tightness squeezing pressure burning chest does have some shortness of breath with exertion last echocardiogram reviewed with the patient showing preserved ejection fraction  Past Medical History:  Diagnosis Date   CHF (congestive heart failure) (HCC)    Coronary artery disease    Hypertension     Past Surgical History:  Procedure Laterality Date   CARDIAC SURGERY     CHOLECYSTECTOMY, LAPAROSCOPIC     VASECTOMY      Current Medications: Current Meds  Medication Sig   albuterol  (VENTOLIN  HFA) 108 (90 Base) MCG/ACT inhaler INHALE 2 PUFFS INTO THE LUNGS EVERY 6 HOURS AS NEEDED FOR WHEEZING (Patient taking differently: Inhale 2 puffs into the lungs every 6 (six) hours as needed for wheezing or shortness of breath.)   alfuzosin  (UROXATRAL ) 10 MG 24 hr tablet Take 1 tablet (10 mg total) by mouth daily with breakfast.   aspirin  81 MG chewable tablet Chew 81 mg by mouth daily.   atorvastatin  (LIPITOR) 80 MG tablet Take 1 tablet (80 mg total) by  mouth at bedtime.   benzonatate  (TESSALON ) 100 MG capsule Take 1 capsule (100 mg total) by mouth every 8 (eight) hours.   budeson-glycopyrrolate -formoterol  (BREZTRI  AEROSPHERE) 160-9-4.8 MCG/ACT AERO inhaler Inhale 2 puffs into the lungs 2 (two) times daily.   clopidogrel  (PLAVIX ) 75 MG tablet Take 1 tablet (75 mg total) by mouth daily.   DULoxetine  (CYMBALTA ) 30 MG capsule Take 1 capsule (30 mg total) by mouth daily.   EPINEPHrine  0.3 mg/0.3 mL IJ SOAJ injection Inject 0.3 mg into the muscle as needed for anaphylaxis.   furosemide (LASIX) 40 MG tablet Take 80 mg by mouth as needed for edema. 40 mg in the morning and 40mg  night   gabapentin  (NEURONTIN ) 300 MG capsule Take 1 capsule (300 mg total) by mouth at bedtime.   KLOR-CON  M20 20 MEQ tablet Take 20 mEq by mouth as needed (take with lasix).   meloxicam  (MOBIC ) 15 MG tablet Take 1 tablet (15 mg total) by mouth every 24 hours with a meal for 24 hours, then daily as needed   metoprolol  succinate (TOPROL -XL) 50 MG 24 hr tablet Take 1 tablet (50 mg total) by mouth daily.   Multiple Vitamin (MULTIVITAMIN) tablet Take 1 tablet by mouth daily.   nitroGLYCERIN  (NITROSTAT ) 0.4 MG SL tablet Place 1 tablet (0.4 mg total) under the tongue every 5 (five) minutes as needed for chest pain.   omeprazole (PRILOSEC) 40 MG capsule Take 40 mg by mouth daily.   ranolazine  (  RANEXA ) 500 MG 12 hr tablet Take 500 mg by mouth 2 (two) times daily.   SYMBICORT  160-4.5 MCG/ACT inhaler INHALE 2 PUFFS INTO THE LUNGS IN THE MORNING AND AT BEDTIME   tadalafil (CIALIS) 20 MG tablet Take 20 mg by mouth as needed for erectile dysfunction.     Allergies:   Bee venom, Iodinated contrast media, Hydrochlorothiazide, and Lisinopril   Social History   Socioeconomic History   Marital status: Married    Spouse name: Not on file   Number of children: 2   Years of education: Not on file   Highest education level: Associate degree: academic program  Occupational History   Not on  file  Tobacco Use   Smoking status: Former    Current packs/day: 0.00    Average packs/day: 1 pack/day for 22.8 years (22.8 ttl pk-yrs)    Types: Cigarettes    Start date: 02/21/1998    Quit date: 11/24/2020    Years since quitting: 2.6    Passive exposure: Never   Smokeless tobacco: Never  Vaping Use   Vaping status: Never Used  Substance and Sexual Activity   Alcohol use: Yes    Comment: social drinker   Drug use: Never   Sexual activity: Not on file  Other Topics Concern   Not on file  Social History Narrative   Lives with wife   Social Drivers of Health   Financial Resource Strain: Low Risk  (03/09/2023)   Overall Financial Resource Strain (CARDIA)    Difficulty of Paying Living Expenses: Not hard at all  Food Insecurity: No Food Insecurity (04/21/2023)   Hunger Vital Sign    Worried About Running Out of Food in the Last Year: Never true    Ran Out of Food in the Last Year: Never true  Transportation Needs: No Transportation Needs (04/21/2023)   PRAPARE - Transportation    Lack of Transportation (Medical): No    Lack of Transportation (Non-Medical): No  Physical Activity: Sufficiently Active (03/09/2023)   Exercise Vital Sign    Days of Exercise per Week: 4 days    Minutes of Exercise per Session: 60 min  Stress: No Stress Concern Present (03/09/2023)   [employer] of Occupational Health - Occupational Stress Questionnaire    Feeling of Stress : Only a little  Social Connections: Patient Declined (04/21/2023)   Social Connection and Isolation Panel [NHANES]    Frequency of Communication with Friends and Family: Patient declined    Frequency of Social Gatherings with Friends and Family: Patient declined    Attends Religious Services: Patient declined    Database administrator or Organizations: Patient declined    Attends Engineer, structural: Patient declined    Marital Status: Patient declined     Family History: The patient's family history includes  Cancer in his mother; Diabetes in his father; Heart Problems in his father. ROS:   Please see the history of present illness.    All 14 point review of systems negative except as described per history of present illness  EKGs/Labs/Other Studies Reviewed:         Recent Labs: 04/21/2023: ALT 28 04/22/2023: Magnesium 2.2 04/23/2023: Hemoglobin 15.2; Platelets 178 04/25/2023: BUN 17; Creatinine, Ser 1.34; Potassium 3.6; Sodium 132  Recent Lipid Panel    Component Value Date/Time   CHOL 116 08/12/2022 1005   CHOL 114 03/29/2022 0913   TRIG 136 08/12/2022 1005   HDL 36 (L) 08/12/2022 1005   HDL 33 (L) 03/29/2022  0913   CHOLHDL 3.2 08/12/2022 1005   LDLCALC 58 08/12/2022 1005    Physical Exam:    VS:  BP 108/70 (BP Location: Right Arm, Patient Position: Sitting)   Pulse 71   Ht 6\' 1"  (1.854 m)   Wt 226 lb (102.5 kg)   SpO2 93%   BMI 29.82 kg/m     Wt Readings from Last 3 Encounters:  07/04/23 226 lb (102.5 kg)  06/20/23 222 lb 3.6 oz (100.8 kg)  06/16/23 212 lb (96.2 kg)     GEN:  Well nourished, well developed in no acute distress HEENT: Normal NECK: No JVD; No carotid bruits LYMPHATICS: No lymphadenopathy CARDIAC: RRR, no murmurs, no rubs, no gallops RESPIRATORY:  Clear to auscultation without rales, wheezing or rhonchi  ABDOMEN: Soft, non-tender, non-distended MUSCULOSKELETAL:  No edema; No deformity  SKIN: Warm and dry LOWER EXTREMITIES: no swelling NEUROLOGIC:  Alert and oriented x 3 PSYCHIATRIC:  Normal affect   ASSESSMENT:    1. Essential hypertension   2. Ischemic cardiomyopathy   3. Chronic obstructive pulmonary disease, unspecified COPD type (HCC)   4. S/P CABG x 3   5. Dyslipidemia    PLAN:    In order of problems listed above:  Essential hypertension: Blood pressure well-controlled continue present management. Ischemic cardiomyopathy stable last echocardiogram showed preserved ejection fraction. Dyslipidemia I did review K PN which show me LDL 58  HDL 36 we will continue present management. Status post coronary bypass graft.  Noted.   Medication Adjustments/Labs and Tests Ordered: Current medicines are reviewed at length with the patient today.  Concerns regarding medicines are outlined above.  Orders Placed This Encounter  Procedures   EKG 12-Lead   Medication changes: No orders of the defined types were placed in this encounter.   Signed, Manfred Seed, MD, [facility] 07/04/2023 1:19 PM    [facility]

## 2023-07-04 NOTE — Patient Instructions (Signed)
 Medication Instructions:  Your physician recommends that you continue on your current medications as directed. Please refer to the Current Medication list given to you today.  *If you need a refill on your cardiac medications before your next appointment, please call your pharmacy*  Lab Work: None If you have labs (blood work) drawn today and your tests are completely normal, you will receive your results only by: MyChart Message (if you have MyChart) OR A paper copy in the mail If you have any lab test that is abnormal or we need to change your treatment, we will call you to review the results.  Testing/Procedures: None  Follow-Up: At [facility], you and your health needs are our priority.  As part of our continuing mission to provide you with exceptional heart care, our providers are all part of one team.  This team includes your primary Cardiologist (physician) and Advanced Practice Providers or APPs (Physician Assistants and Nurse Practitioners) who all work together to provide you with the care you need, when you need it.  Your next appointment:   6 month(s)  Provider:   Gypsy Balsam, MD  We recommend signing up for the patient portal called "MyChart".  Sign up information is provided on this After Visit Summary.  MyChart is used to connect with patients for Virtual Visits (Telemedicine).  Patients are able to view lab/test results, encounter notes, upcoming appointments, etc.  Non-urgent messages can be sent to your provider as well.   To learn more about what you can do with MyChart, go to [email or web address].   Other Instructions None

## 2023-07-05 NOTE — Progress Notes (Signed)
 Pulmonary Individual Treatment Plan  Patient Details  Name: Paul Hardy MRN: [id number] Date of Birth: [birth year] Referring Provider:   Gattis Kass Pulmonary Rehab Walk Test from 06/05/2023 in [facility]  Referring Provider Mannam       Initial Encounter Date:  Flowsheet Row Pulmonary Rehab Walk Test from 06/05/2023 in [facility]  Date 06/05/23       Visit Diagnosis: Centrilobular emphysema (HCC)  Patient's Home Medications on Admission:   Current Outpatient Medications:    albuterol  (VENTOLIN  HFA) 108 (90 Base) MCG/ACT inhaler, INHALE 2 PUFFS INTO THE LUNGS EVERY 6 HOURS AS NEEDED FOR WHEEZING (Patient taking differently: Inhale 2 puffs into the lungs every 6 (six) hours as needed for wheezing or shortness of breath.), Disp: 8.5 each, Rfl: 3   alfuzosin  (UROXATRAL ) 10 MG 24 hr tablet, Take 1 tablet (10 mg total) by mouth daily with breakfast., Disp: 30 tablet, Rfl: 11   aspirin  81 MG chewable tablet, Chew 81 mg by mouth daily., Disp: , Rfl:    atorvastatin  (LIPITOR) 80 MG tablet, Take 1 tablet (80 mg total) by mouth at bedtime., Disp: 90 tablet, Rfl: 3   benzonatate  (TESSALON ) 100 MG capsule, Take 1 capsule (100 mg total) by mouth every 8 (eight) hours., Disp: 21 capsule, Rfl: 0   budeson-glycopyrrolate -formoterol  (BREZTRI  AEROSPHERE) 160-9-4.8 MCG/ACT AERO inhaler, Inhale 2 puffs into the lungs 2 (two) times daily., Disp: 21.4 g, Rfl: 0   clopidogrel  (PLAVIX ) 75 MG tablet, Take 1 tablet (75 mg total) by mouth daily., Disp: 90 tablet, Rfl: 0   DULoxetine  (CYMBALTA ) 30 MG capsule, Take 1 capsule (30 mg total) by mouth daily., Disp: 90 capsule, Rfl: 2   EPINEPHrine  0.3 mg/0.3 mL IJ SOAJ injection, Inject 0.3 mg into the muscle as needed for anaphylaxis., Disp: , Rfl:    furosemide (LASIX) 40 MG tablet, Take 80 mg by mouth as needed for edema. 40 mg in the morning  and 40mg  night, Disp: , Rfl:    gabapentin  (NEURONTIN ) 300 MG capsule, Take 1 capsule (300 mg total) by mouth at bedtime., Disp: 30 capsule, Rfl: 3   KLOR-CON  M20 20 MEQ tablet, Take 20 mEq by mouth as needed (take with lasix)., Disp: , Rfl:    meloxicam  (MOBIC ) 15 MG tablet, Take 1 tablet (15 mg total) by mouth every 24 hours with a meal for 24 hours, then daily as needed, Disp: 30 tablet, Rfl: 3   metoprolol  succinate (TOPROL -XL) 50 MG 24 hr tablet, Take 1 tablet (50 mg total) by mouth daily., Disp: 90 tablet, Rfl: 3   Multiple Vitamin (MULTIVITAMIN) tablet, Take 1 tablet by mouth daily., Disp: , Rfl:    nitroGLYCERIN  (NITROSTAT ) 0.4 MG SL tablet, Place 1 tablet (0.4 mg total) under the tongue every 5 (five) minutes as needed for chest pain., Disp: 25 tablet, Rfl: 0   omeprazole (PRILOSEC) 40 MG capsule, Take 40 mg by mouth daily., Disp: , Rfl:    ranolazine  (RANEXA ) 500 MG 12 hr tablet, Take 500 mg by mouth 2 (two) times daily., Disp: , Rfl:    SYMBICORT  160-4.5 MCG/ACT inhaler, INHALE 2 PUFFS INTO THE LUNGS IN THE MORNING AND AT BEDTIME, Disp: 30.6 each, Rfl: 1   tadalafil (CIALIS) 20 MG tablet, Take 20 mg by mouth as needed for erectile dysfunction., Disp: , Rfl:   Past Medical History: Past Medical History:  Diagnosis Date   CHF (congestive  heart failure) (HCC)    Coronary artery disease    Hypertension     Tobacco Use: Social History   Tobacco Use  Smoking Status Former   Current packs/day: 0.00   Average packs/day: 1 pack/day for 22.8 years (22.8 ttl pk-yrs)   Types: Cigarettes   Start date: 02/21/1998   Quit date: 11/24/2020   Years since quitting: 2.6   Passive exposure: Never  Smokeless Tobacco Never    Labs: Review Flowsheet  More data exists      Latest Ref Rng & Units 10/21/2020 04/23/2021 09/28/2021 03/29/2022 08/12/2022  Labs for ITP Cardiac and Pulmonary Rehab  Cholestrol <200 mg/dL 782  956  - 213  086   LDL (calc) mg/dL (calc) 98  64  - 46  58   HDL-C > OR = 40  mg/dL 37  32  - 33  36   Trlycerides <150 mg/dL 578  469  - 629  528   Hemoglobin A1c <5.7 % of total Hgb - - 5.9  - -    Capillary Blood Glucose: No results found for: "GLUCAP"   Pulmonary Assessment Scores:  Pulmonary Assessment Scores     Row Name 06/05/23 0918         ADL UCSD   ADL Phase Entry     SOB Score total 8       CAT Score   CAT Score 7       mMRC Score   mMRC Score 1             UCSD: Self-administered rating of dyspnea associated with activities of daily living (ADLs) 6-point scale (0 = "not at all" to 5 = "maximal or unable to do because of breathlessness")  Scoring Scores range from 0 to 120.  Minimally important difference is 5 units  CAT: CAT can identify the health impairment of COPD patients and is better correlated with disease progression.  CAT has a scoring range of zero to 40. The CAT score is classified into four groups of low (less than 10), medium (10 - 20), high (21-30) and very high (31-40) based on the impact level of disease on health status. A CAT score over 10 suggests significant symptoms.  A worsening CAT score could be explained by an exacerbation, poor medication adherence, poor inhaler technique, or progression of COPD or comorbid conditions.  CAT MCID is 2 points  mMRC: mMRC (Modified Medical Research Council) Dyspnea Scale is used to assess the degree of baseline functional disability in patients of respiratory disease due to dyspnea. No minimal important difference is established. A decrease in score of 1 point or greater is considered a positive change.   Pulmonary Function Assessment:  Pulmonary Function Assessment - 06/05/23 0926       Breath   Bilateral Breath Sounds Clear    Shortness of Breath Yes;Limiting activity             Exercise Target Goals: Exercise Program Goal: Individual exercise prescription set using results from initial 6 min walk test and THRR while considering  patient's activity barriers and  safety.   Exercise Prescription Goal: Initial exercise prescription builds to 30-45 minutes a day of aerobic activity, 2-3 days per week.  Home exercise guidelines will be given to patient during program as part of exercise prescription that the participant will acknowledge.  Activity Barriers & Risk Stratification:  Activity Barriers & Cardiac Risk Stratification - 06/05/23 0924       Activity Barriers &  Cardiac Risk Stratification   Activity Barriers Muscular Weakness;Shortness of Breath;Deconditioning    Cardiac Risk Stratification Low             6 Minute Walk:  6 Minute Walk     Row Name 06/05/23 0924         6 Minute Walk   Phase Initial     Distance 1245 feet     Walk Time 6 minutes     # of Rest Breaks 0     MPH 0.94     METS 3.01     RPE 10.5     Perceived Dyspnea  0     VO2 Peak 10.55     Symptoms No     Resting HR 67 bpm     Resting BP 120/70     Resting Oxygen Saturation  95 %     Exercise Oxygen Saturation  during 6 min walk 93 %     Max Ex. HR 78 bpm     Max Ex. BP 134/80     2 Minute Post BP 118/74       Interval HR   1 Minute HR 74     2 Minute HR 74     3 Minute HR 77     4 Minute HR 78     5 Minute HR 77     6 Minute HR 77     2 Minute Post HR 68     Interval Heart Rate? Yes       Interval Oxygen   Interval Oxygen? Yes     Baseline Oxygen Saturation % 95 %     1 Minute Oxygen Saturation % 96 %     1 Minute Liters of Oxygen 0 L     2 Minute Oxygen Saturation % 93 %     2 Minute Liters of Oxygen 0 L     3 Minute Oxygen Saturation % 94 %     3 Minute Liters of Oxygen 0 L     4 Minute Oxygen Saturation % 94 %     4 Minute Liters of Oxygen 0 L     5 Minute Oxygen Saturation % 95 %     5 Minute Liters of Oxygen 0 L     6 Minute Oxygen Saturation % 96 %     6 Minute Liters of Oxygen 0 L     2 Minute Post Oxygen Saturation % 95 %     2 Minute Post Liters of Oxygen 0 L              Oxygen Initial Assessment:  Oxygen Initial  Assessment - 06/05/23 0925       Home Oxygen   Home Oxygen Device None    Sleep Oxygen Prescription None    Home Exercise Oxygen Prescription None    Home Resting Oxygen Prescription None      Initial 6 min Walk   Oxygen Used None      Program Oxygen Prescription   Program Oxygen Prescription None      Intervention   Short Term Goals To learn and understand importance of maintaining oxygen saturations>88%;To learn and demonstrate proper use of respiratory medications;To learn and understand importance of monitoring SPO2 with pulse oximeter and demonstrate accurate use of the pulse oximeter.;To learn and demonstrate proper pursed lip breathing techniques or other breathing techniques.     Long  Term Goals Maintenance of O2 saturations>88%;Compliance with respiratory medication;Verbalizes  importance of monitoring SPO2 with pulse oximeter and return demonstration;Exhibits proper breathing techniques, such as pursed lip breathing or other method taught during program session;Demonstrates proper use of MDI's             Oxygen Re-Evaluation:  Oxygen Re-Evaluation     Row Name 06/06/23 1535 06/28/23 1003           Program Oxygen Prescription   Program Oxygen Prescription None None        Home Oxygen   Home Oxygen Device None None      Sleep Oxygen Prescription None None      Home Exercise Oxygen Prescription None None      Home Resting Oxygen Prescription None None        Goals/Expected Outcomes   Short Term Goals To learn and understand importance of maintaining oxygen saturations>88%;To learn and demonstrate proper use of respiratory medications;To learn and understand importance of monitoring SPO2 with pulse oximeter and demonstrate accurate use of the pulse oximeter.;To learn and demonstrate proper pursed lip breathing techniques or other breathing techniques.  To learn and understand importance of maintaining oxygen saturations>88%;To learn and demonstrate proper use of  respiratory medications;To learn and understand importance of monitoring SPO2 with pulse oximeter and demonstrate accurate use of the pulse oximeter.;To learn and demonstrate proper pursed lip breathing techniques or other breathing techniques.       Long  Term Goals Maintenance of O2 saturations>88%;Compliance with respiratory medication;Verbalizes importance of monitoring SPO2 with pulse oximeter and return demonstration;Exhibits proper breathing techniques, such as pursed lip breathing or other method taught during program session;Demonstrates proper use of MDI's Maintenance of O2 saturations>88%;Compliance with respiratory medication;Verbalizes importance of monitoring SPO2 with pulse oximeter and return demonstration;Exhibits proper breathing techniques, such as pursed lip breathing or other method taught during program session;Demonstrates proper use of MDI's      Goals/Expected Outcomes Compliance and understanding of oxygen saturation monitoring and breathing techniques to decrease shortness of breath. Compliance and understanding of oxygen saturation monitoring and breathing techniques to decrease shortness of breath.               Oxygen Discharge (Final Oxygen Re-Evaluation):  Oxygen Re-Evaluation - 06/28/23 1003       Program Oxygen Prescription   Program Oxygen Prescription None      Home Oxygen   Home Oxygen Device None    Sleep Oxygen Prescription None    Home Exercise Oxygen Prescription None    Home Resting Oxygen Prescription None      Goals/Expected Outcomes   Short Term Goals To learn and understand importance of maintaining oxygen saturations>88%;To learn and demonstrate proper use of respiratory medications;To learn and understand importance of monitoring SPO2 with pulse oximeter and demonstrate accurate use of the pulse oximeter.;To learn and demonstrate proper pursed lip breathing techniques or other breathing techniques.     Long  Term Goals Maintenance of O2  saturations>88%;Compliance with respiratory medication;Verbalizes importance of monitoring SPO2 with pulse oximeter and return demonstration;Exhibits proper breathing techniques, such as pursed lip breathing or other method taught during program session;Demonstrates proper use of MDI's    Goals/Expected Outcomes Compliance and understanding of oxygen saturation monitoring and breathing techniques to decrease shortness of breath.             Initial Exercise Prescription:  Initial Exercise Prescription - 06/05/23 0900       Date of Initial Exercise RX and Referring Provider   Date 06/05/23    Referring Provider Mannam  Expected Discharge Date 08/31/23      Treadmill   MPH 2.5    Grade 0    Minutes 15    METs 2.5      Elliptical   Level 1    Speed 1    Minutes 15    METs 3      Prescription Details   Frequency (times per week) 2    Duration Progress to 30 minutes of continuous aerobic without signs/symptoms of physical distress      Intensity   THRR 40-80% of Max Heartrate 63-126    Ratings of Perceived Exertion 11-13    Perceived Dyspnea 0-4      Progression   Progression Continue to progress workloads to maintain intensity without signs/symptoms of physical distress.      Resistance Training   Training Prescription Yes    Weight blue bands    Reps 10-15             Perform Capillary Blood Glucose checks as needed.  Exercise Prescription Changes:   Exercise Prescription Changes     Row Name 06/20/23 0900             Response to Exercise   Blood Pressure (Admit) 126/82       Blood Pressure (Exercise) 142/80       Blood Pressure (Exit) 108/60       Heart Rate (Admit) 68 bpm       Heart Rate (Exercise) 77 bpm       Heart Rate (Exit) 74 bpm       Oxygen Saturation (Admit) 96 %       Oxygen Saturation (Exercise) 97 %       Oxygen Saturation (Exit) 96 %       Rating of Perceived Exertion (Exercise) 10       Perceived Dyspnea (Exercise) 1          Progression   Progression Continue to progress workloads to maintain intensity without signs/symptoms of physical distress.         Resistance Training   Training Prescription Yes       Weight black bands       Reps 10-15       Time 10 Minutes         Treadmill   MPH 2.5       Grade 1       Minutes 15       METs 3.2         Elliptical   Level 1       Speed 1       Minutes 15       METs 4                Exercise Comments:   Exercise Comments     Row Name 06/13/23 [phone]           Exercise Comments Pt completed first day of exercise. He exercised for 15 min on the upright elliptical and treadmill. Paul Hardy averaged 4.1 METs at level and speed 1 on the upright elliptical and 3.1 METs at 2.5 mph and 1% incline. Paul Hardy performed the warmup and cooldown standing without limitations. Discussed METs.                Exercise Goals and Review:   Exercise Goals     Row Name 06/05/23 0901             Exercise Goals   Increase  Physical Activity Yes       Intervention Provide advice, education, support and counseling about physical activity/exercise needs.;Develop an individualized exercise prescription for aerobic and resistive training based on initial evaluation findings, risk stratification, comorbidities and participant's personal goals.       Expected Outcomes Short Term: Attend rehab on a regular basis to increase amount of physical activity.;Long Term: Exercising regularly at least 3-5 days a week.;Long Term: Add in home exercise to make exercise part of routine and to increase amount of physical activity.       Increase Strength and Stamina Yes       Intervention Provide advice, education, support and counseling about physical activity/exercise needs.;Develop an individualized exercise prescription for aerobic and resistive training based on initial evaluation findings, risk stratification, comorbidities and participant's personal goals.       Expected Outcomes  Short Term: Increase workloads from initial exercise prescription for resistance, speed, and METs.;Short Term: Perform resistance training exercises routinely during rehab and add in resistance training at home;Long Term: Improve cardiorespiratory fitness, muscular endurance and strength as measured by increased METs and functional capacity ( )       Able to understand and use rate of perceived exertion (RPE) scale Yes       Intervention Provide education and explanation on how to use RPE scale       Expected Outcomes Short Term: Able to use RPE daily in rehab to express subjective intensity level;Long Term:  Able to use RPE to guide intensity level when exercising independently       Able to understand and use Dyspnea scale Yes       Intervention Provide education and explanation on how to use Dyspnea scale       Expected Outcomes Short Term: Able to use Dyspnea scale daily in rehab to express subjective sense of shortness of breath during exertion;Long Term: Able to use Dyspnea scale to guide intensity level when exercising independently       Knowledge and understanding of Target Heart Rate Range (THRR) Yes       Intervention Provide education and explanation of THRR including how the numbers were predicted and where they are located for reference       Expected Outcomes Short Term: Able to state/look up THRR;Long Term: Able to use THRR to govern intensity when exercising independently;Short Term: Able to use daily as guideline for intensity in rehab       Understanding of Exercise Prescription Yes       Intervention Provide education, explanation, and written materials on patient's individual exercise prescription       Expected Outcomes Short Term: Able to explain program exercise prescription;Long Term: Able to explain home exercise prescription to exercise independently                Exercise Goals Re-Evaluation :  Exercise Goals Re-Evaluation     Row Name 06/06/23 1533 06/28/23  0953           Exercise Goal Re-Evaluation   Exercise Goals Review Increase Physical Activity;Able to understand and use Dyspnea scale;Understanding of Exercise Prescription;Increase Strength and Stamina;Knowledge and understanding of Target Heart Rate Range (THRR);Able to understand and use rate of perceived exertion (RPE) scale Increase Physical Activity;Able to understand and use Dyspnea scale;Understanding of Exercise Prescription;Increase Strength and Stamina;Knowledge and understanding of Target Heart Rate Range (THRR);Able to understand and use rate of perceived exertion (RPE) scale      Comments Paul Hardy is scheduled to begin exercise on  4/22. Will continue to monitor and progress as able. Paul Hardy has completed 2 exercise sessions. He exercises for 15 min on the upright elliptical and treadmill. He averages 4 METs at level and speed 1 on the upright elliptical and 3.1 METs at 2.5 mph and 1 on the treadmill. He performes the warmup and cooldown standing without limitations. Paul Hardy has had inconsistent attendance due to work. Will continue monitor and progress as able.      Expected Outcomes Through exercise at rehab and home, the patient will decrease shortness of breath with daily activities and feel confident in carrying out an exercise regimen at home. Through exercise at rehab and home, the patient will decrease shortness of breath with daily activities and feel confident in carrying out an exercise regimen at home.               Discharge Exercise Prescription (Final Exercise Prescription Changes):  Exercise Prescription Changes - 06/20/23 0900       Response to Exercise   Blood Pressure (Admit) 126/82    Blood Pressure (Exercise) 142/80    Blood Pressure (Exit) 108/60    Heart Rate (Admit) 68 bpm    Heart Rate (Exercise) 77 bpm    Heart Rate (Exit) 74 bpm    Oxygen Saturation (Admit) 96 %    Oxygen Saturation (Exercise) 97 %    Oxygen Saturation (Exit) 96 %    Rating of Perceived  Exertion (Exercise) 10    Perceived Dyspnea (Exercise) 1      Progression   Progression Continue to progress workloads to maintain intensity without signs/symptoms of physical distress.      Resistance Training   Training Prescription Yes    Weight black bands    Reps 10-15    Time 10 Minutes      Treadmill   MPH 2.5    Grade 1    Minutes 15    METs 3.2      Elliptical   Level 1    Speed 1    Minutes 15    METs 4             Nutrition:  Target Goals: Understanding of nutrition guidelines, daily intake of sodium 1500mg , cholesterol 200mg , calories 30% from fat and 7% or less from saturated fats, daily to have 5 or more servings of fruits and vegetables.  Biometrics:  Pre Biometrics - 06/05/23 0900       Pre Biometrics   Grip Strength 42 kg              Nutrition Therapy Plan and Nutrition Goals:  Nutrition Therapy & Goals - 06/13/23 1013       Nutrition Therapy   Diet Heart healthy diet    Drug/Food Interactions Statins/Certain Fruits      Personal Nutrition Goals   Nutrition Goal Patient to improve diet quality by using the plate method as a guide for meal planning to include lean protein/plant protein, fruits, vegetables, whole grains, nonfat dairy as part of a well-balanced diet.    Comments Paul Hardy has medical history of COPD, Centrilobular emhysema, HTN, STEMI, s/p CABGx3, OSA on CPAP, CKD3. He has previously completed cardiac rehab in 2023. LDL is well controlled. He does report some motivation to lose weight; will continue to discuss strategies for weight loss including calorie density, benefits of high fiber/high protein intake, the plate method,etc. Patient will benefit from participation in pulmonary rehab for nutrition, exercise, and lifestyle modification.  Intervention Plan   Intervention Prescribe, educate and counsel regarding individualized specific dietary modifications aiming towards targeted core components such as weight,  hypertension, lipid management, diabetes, heart failure and other comorbidities.;Nutrition handout(s) given to patient.    Expected Outcomes Short Term Goal: Understand basic principles of dietary content, such as calories, fat, sodium, cholesterol and nutrients.;Long Term Goal: Adherence to prescribed nutrition plan.             Nutrition Assessments:  MEDIFICTS Score Key: >=70 Need to make dietary changes  40-70 Heart Healthy Diet <= 40 Therapeutic Level Cholesterol Diet  Flowsheet Row PULMONARY REHAB OTHER RESPIRATORY from 06/13/2023 in [facility]  Picture Your Plate Total Score on Admission 65      Picture Your Plate Scores: <40 Unhealthy dietary pattern with much room for improvement. 41-50 Dietary pattern unlikely to meet recommendations for good health and room for improvement. 51-60 More healthful dietary pattern, with some room for improvement.  >60 Healthy dietary pattern, although there may be some specific behaviors that could be improved.    Nutrition Goals Re-Evaluation:  Nutrition Goals Re-Evaluation     Row Name 06/13/23 1013             Goals   Current Weight 222 lb 3.6 oz (100.8 kg)       Comment Cr 1.34, GFR 60, LDL 58, HDL 36       Expected Outcome Paul Hardy has medical history of COPD, Centrilobular emhysema, HTN, STEMI, s/p CABGx3, OSA on CPAP, CKD3. He has previously completed cardiac rehab in 2023. LDL is well controlled. He does report some motivation to lose weight; will continue to discuss strategies for weight loss including calorie density, benefits of high fiber/high protein intake, the plate method,etc. Patient will benefit from participation in pulmonary rehab for nutrition, exercise, and lifestyle modification.                Nutrition Goals Discharge (Final Nutrition Goals Re-Evaluation):  Nutrition Goals Re-Evaluation - 06/13/23 1013       Goals   Current Weight 222 lb 3.6 oz  (100.8 kg)    Comment Cr 1.34, GFR 60, LDL 58, HDL 36    Expected Outcome Paul Hardy has medical history of COPD, Centrilobular emhysema, HTN, STEMI, s/p CABGx3, OSA on CPAP, CKD3. He has previously completed cardiac rehab in 2023. LDL is well controlled. He does report some motivation to lose weight; will continue to discuss strategies for weight loss including calorie density, benefits of high fiber/high protein intake, the plate method,etc. Patient will benefit from participation in pulmonary rehab for nutrition, exercise, and lifestyle modification.             Psychosocial: Target Goals: Acknowledge presence or absence of significant depression and/or stress, maximize coping skills, provide positive support system. Participant is able to verbalize types and ability to use techniques and skills needed for reducing stress and depression.  Initial Review & Psychosocial Screening:  Initial Psych Review & Screening - 06/05/23 0922       Initial Review   Current issues with History of Depression;Current Psychotropic Meds      Family Dynamics   Good Support System? Yes      Barriers   Psychosocial barriers to participate in program There are no identifiable barriers or psychosocial needs.      Screening Interventions   Interventions Encouraged to exercise             Quality  of Life Scores:  Scores of 19 and below usually indicate a poorer quality of life in these areas.  A difference of  2-3 points is a clinically meaningful difference.  A difference of 2-3 points in the total score of the Quality of Life Index has been associated with significant improvement in overall quality of life, self-image, physical symptoms, and general health in studies assessing change in quality of life.  PHQ-9: Review Flowsheet  More data exists      06/05/2023 03/16/2023 08/12/2022 03/14/2022 12/09/2021  Depression screen PHQ 2/9  Decreased Interest 0 0 0 0 1  Down, Depressed, Hopeless 0 0 0 0 0   PHQ - 2 Score 0 0 0 0 1  Altered sleeping 1 - - - 1  Tired, decreased energy 1 - - - 1  Change in appetite 0 - - - 0  Feeling bad or failure about yourself  0 - - - 0  Trouble concentrating 0 - - - 0  Moving slowly or fidgety/restless 0 - - - 0  Suicidal thoughts 0 - - - 0  PHQ-9 Score 2 - - - 3  Difficult doing work/chores Not difficult at all - - - Somewhat difficult   Interpretation of Total Score  Total Score Depression Severity:  1-4 = Minimal depression, 5-9 = Mild depression, 10-14 = Moderate depression, 15-19 = Moderately severe depression, 20-27 = Severe depression   Psychosocial Evaluation and Intervention:  Psychosocial Evaluation - 06/05/23 0945       Psychosocial Evaluation & Interventions   Interventions Encouraged to exercise with the program and follow exercise prescription    Comments Paul Hardy denies any psychosocial barriers or concerns at this time    Expected Outcomes For Paul Hardy to participate in PR free of any psychosocial barriers or concerns    Continue Psychosocial Services  No Follow up required             Psychosocial Re-Evaluation:  Psychosocial Re-Evaluation     Row Name 06/06/23 1520 06/26/23 0949           Psychosocial Re-Evaluation   Current issues with History of Depression;Current Psychotropic Meds History of Depression;Current Psychotropic Meds      Comments Paul Hardy is scheduled to start PR on 4/22. No new barriers or concerns since orientation on 4/14. Paul Hardy continues to deny any psychosocial barriers or concerns at this time.      Expected Outcomes For Paul Hardy to participate in PR free of any psychosocial barriers or concerns For Paul Hardy to participate in PR free of any psychosocial barriers or concerns      Interventions -- Encouraged to attend Pulmonary Rehabilitation for the exercise      Continue Psychosocial Services  No Follow up required No Follow up required               Psychosocial Discharge (Final Psychosocial  Re-Evaluation):  Psychosocial Re-Evaluation - 06/26/23 0949       Psychosocial Re-Evaluation   Current issues with History of Depression;Current Psychotropic Meds    Comments Paul Hardy continues to deny any psychosocial barriers or concerns at this time.    Expected Outcomes For Paul Hardy to participate in PR free of any psychosocial barriers or concerns    Interventions Encouraged to attend Pulmonary Rehabilitation for the exercise    Continue Psychosocial Services  No Follow up required             Education: Education Goals: Education classes will be provided on a weekly basis, covering  required topics. Participant will state understanding/return demonstration of topics presented.  Learning Barriers/Preferences:  Learning Barriers/Preferences - 06/05/23 0923       Learning Barriers/Preferences   Learning Barriers Sight   wears glasses   Learning Preferences Audio;Written Material;Skilled Demonstration;Group Instruction             Education Topics: Know Your Numbers Group instruction that is supported by a PowerPoint presentation. Instructor discusses importance of knowing and understanding resting, exercise, and post-exercise oxygen saturation, heart rate, and blood pressure. Oxygen saturation, heart rate, blood pressure, rating of perceived exertion, and dyspnea are reviewed along with a normal range for these values.    Exercise for the Pulmonary Patient Group instruction that is supported by a PowerPoint presentation. Instructor discusses benefits of exercise, core components of exercise, frequency, duration, and intensity of an exercise routine, importance of utilizing pulse oximetry during exercise, safety while exercising, and options of places to exercise outside of rehab.    MET Level  Group instruction provided by PowerPoint, verbal discussion, and written material to support subject matter. Instructor reviews what METs are and how to increase METs.    Pulmonary  Medications Verbally interactive group education provided by instructor with focus on inhaled medications and proper administration.   Anatomy and Physiology of the Respiratory System Group instruction provided by PowerPoint, verbal discussion, and written material to support subject matter. Instructor reviews respiratory cycle and anatomical components of the respiratory system and their functions. Instructor also reviews differences in obstructive and restrictive respiratory diseases with examples of each.    Oxygen Safety Group instruction provided by PowerPoint, verbal discussion, and written material to support subject matter. There is an overview of "What is Oxygen" and "Why do we need it".  Instructor also reviews how to create a safe environment for oxygen use, the importance of using oxygen as prescribed, and the risks of noncompliance. There is a brief discussion on traveling with oxygen and resources the patient may utilize.   Oxygen Use Group instruction provided by PowerPoint, verbal discussion, and written material to discuss how supplemental oxygen is prescribed and different types of oxygen supply systems. Resources for more information are provided.    Breathing Techniques Group instruction that is supported by demonstration and informational handouts. Instructor discusses the benefits of pursed lip and diaphragmatic breathing and detailed demonstration on how to perform both.     Risk Factor Reduction Group instruction that is supported by a PowerPoint presentation. Instructor discusses the definition of a risk factor, different risk factors for pulmonary disease, and how the heart and lungs work together.   Pulmonary Diseases Group instruction provided by PowerPoint, verbal discussion, and written material to support subject matter. Instructor gives an overview of the different type of pulmonary diseases. There is also a discussion on risk factors and symptoms as well as  ways to manage the diseases.   Stress and Energy Conservation Group instruction provided by PowerPoint, verbal discussion, and written material to support subject matter. Instructor gives an overview of stress and the impact it can have on the body. Instructor also reviews ways to reduce stress. There is also a discussion on energy conservation and ways to conserve energy throughout the day.   Warning Signs and Symptoms Group instruction provided by PowerPoint, verbal discussion, and written material to support subject matter. Instructor reviews warning signs and symptoms of stroke, heart attack, cold and flu. Instructor also reviews ways to prevent the spread of infection.   Other Education Group or individual verbal,  written, or video instructions that support the educational goals of the pulmonary rehab program.    Knowledge Questionnaire Score:  Knowledge Questionnaire Score - 06/05/23 0916       Knowledge Questionnaire Score   Pre Score 16/18             Core Components/Risk Factors/Patient Goals at Admission:  Personal Goals and Risk Factors at Admission - 06/05/23 0923       Core Components/Risk Factors/Patient Goals on Admission    Weight Management Yes;Weight Loss    Intervention Weight Management: Develop a combined nutrition and exercise program designed to reach desired caloric intake, while maintaining appropriate intake of nutrient and fiber, sodium and fats, and appropriate energy expenditure required for the weight goal.;Weight Management: Provide education and appropriate resources to help participant work on and attain dietary goals.;Weight Management/Obesity: Establish reasonable short term and long term weight goals.;Obesity: Provide education and appropriate resources to help participant work on and attain dietary goals.    Admit Weight 222 lb 3.6 oz (100.8 kg)    Expected Outcomes Short Term: Continue to assess and modify interventions until short term weight  is achieved;Long Term: Adherence to nutrition and physical activity/exercise program aimed toward attainment of established weight goal;Weight Loss: Understanding of general recommendations for a balanced deficit meal plan, which promotes 1-2 lb weight loss per week and includes a negative energy balance of [phone] kcal/d;Understanding recommendations for meals to include 15-35% energy as protein, 25-35% energy from fat, 35-60% energy from carbohydrates, less than 200mg  of dietary cholesterol, 20-35 gm of total fiber daily;Understanding of distribution of calorie intake throughout the day with the consumption of 4-5 meals/snacks    Improve shortness of breath with ADL's Yes    Intervention Provide education, individualized exercise plan and daily activity instruction to help decrease symptoms of SOB with activities of daily living.    Expected Outcomes Short Term: Improve cardiorespiratory fitness to achieve a reduction of symptoms when performing ADLs;Long Term: Be able to perform more ADLs without symptoms or delay the onset of symptoms             Core Components/Risk Factors/Patient Goals Review:   Goals and Risk Factor Review     Row Name 06/06/23 1523 06/26/23 0950           Core Components/Risk Factors/Patient Goals Review   Personal Goals Review Weight Management/Obesity;Improve shortness of breath with ADL's;Develop more efficient breathing techniques such as purse lipped breathing and diaphragmatic breathing and practicing self-pacing with activity. Weight Management/Obesity;Improve shortness of breath with ADL's;Develop more efficient breathing techniques such as purse lipped breathing and diaphragmatic breathing and practicing self-pacing with activity.      Review Paul Hardy has not started PR yet. Unable to assess his goals at this time. Monthly review of patient's Core Components/Risk Factors/Patient Goals are as follows: Goal progressing for improving shortness of breath with ADL's.  Paul Hardy is currently exercising on RA to maintain sats >88%. He is currently exercising on the treadmill and the elliptical. Goal progressing for developing more efficient breathing techniques such as purse lipped breathing and diaphragmatic breathing; and practicing self-pacing with activity. Goal progressing for weight loss. Paul Hardy is working with staff dietitian to achieve his weight loss goals.      Expected Outcomes To improve shortness of breath with ADL's, develop more efficient breathing techniques such as purse lipped breathing and diaphragmatic breathing; and practicing self-pacing with activity and lose weight. To improve shortness of breath with ADL's, develop more efficient breathing techniques  such as purse lipped breathing and diaphragmatic breathing; and practicing self-pacing with activity and lose weight.               Core Components/Risk Factors/Patient Goals at Discharge (Final Review):   Goals and Risk Factor Review - 06/26/23 0950       Core Components/Risk Factors/Patient Goals Review   Personal Goals Review Weight Management/Obesity;Improve shortness of breath with ADL's;Develop more efficient breathing techniques such as purse lipped breathing and diaphragmatic breathing and practicing self-pacing with activity.    Review Monthly review of patient's Core Components/Risk Factors/Patient Goals are as follows: Goal progressing for improving shortness of breath with ADL's. Paul Hardy is currently exercising on RA to maintain sats >88%. He is currently exercising on the treadmill and the elliptical. Goal progressing for developing more efficient breathing techniques such as purse lipped breathing and diaphragmatic breathing; and practicing self-pacing with activity. Goal progressing for weight loss. Paul Hardy is working with staff dietitian to achieve his weight loss goals.    Expected Outcomes To improve shortness of breath with ADL's, develop more efficient breathing techniques such as  purse lipped breathing and diaphragmatic breathing; and practicing self-pacing with activity and lose weight.             ITP Comments: Pt is making expected progress toward Pulmonary Rehab goals after completing 2 session(s). Recommend continued exercise, life style modification, education, and utilization of breathing techniques to increase stamina and strength, while also decreasing shortness of breath with exertion.  Dr. Genetta Kenning is Medical Director for Pulmonary Rehab at [facility].

## 2023-07-06 ENCOUNTER — Ambulatory Visit (HOSPITAL_COMMUNITY)

## 2023-07-06 ENCOUNTER — Encounter (HOSPITAL_COMMUNITY): Admission: RE | Admit: 2023-07-06 | Source: Ambulatory Visit

## 2023-07-07 ENCOUNTER — Other Ambulatory Visit: Payer: Self-pay | Admitting: Family Medicine

## 2023-07-07 ENCOUNTER — Other Ambulatory Visit (HOSPITAL_COMMUNITY): Payer: Self-pay

## 2023-07-07 ENCOUNTER — Other Ambulatory Visit: Payer: Self-pay

## 2023-07-07 DIAGNOSIS — I255 Ischemic cardiomyopathy: Secondary | ICD-10-CM

## 2023-07-07 DIAGNOSIS — Z951 Presence of aortocoronary bypass graft: Secondary | ICD-10-CM

## 2023-07-07 DIAGNOSIS — J441 Chronic obstructive pulmonary disease with (acute) exacerbation: Secondary | ICD-10-CM

## 2023-07-07 DIAGNOSIS — J411 Mucopurulent chronic bronchitis: Secondary | ICD-10-CM

## 2023-07-07 MED ORDER — NITROGLYCERIN 0.4 MG SL SUBL
0.4000 mg | SUBLINGUAL_TABLET | SUBLINGUAL | 0 refills | Status: DC | PRN
  Filled 2023-07-07: qty 25, 8d supply, fill #0

## 2023-07-07 MED ORDER — CLOPIDOGREL BISULFATE 75 MG PO TABS
75.0000 mg | ORAL_TABLET | Freq: Every day | ORAL | 0 refills | Status: DC
  Filled 2023-07-07: qty 90, 90d supply, fill #0

## 2023-07-07 MED ORDER — DULOXETINE HCL 30 MG PO CPEP
30.0000 mg | ORAL_CAPSULE | Freq: Every day | ORAL | 0 refills | Status: DC
  Filled 2023-07-07 – 2023-08-23 (×3): qty 90, 90d supply, fill #0

## 2023-07-07 MED ORDER — METOPROLOL SUCCINATE ER 50 MG PO TB24
50.0000 mg | ORAL_TABLET | Freq: Every day | ORAL | 0 refills | Status: DC
Start: 2023-07-07 — End: 2023-09-09
  Filled 2023-07-07 – 2023-08-24 (×3): qty 90, 90d supply, fill #0

## 2023-07-07 MED ORDER — BREZTRI AEROSPHERE 160-9-4.8 MCG/ACT IN AERO
2.0000 | INHALATION_SPRAY | Freq: Two times a day (BID) | RESPIRATORY_TRACT | 0 refills | Status: AC
Start: 2023-07-07 — End: ?
  Filled 2023-07-07 – 2023-08-09 (×2): qty 21.4, 60d supply, fill #0

## 2023-07-10 ENCOUNTER — Ambulatory Visit: Payer: Self-pay | Admitting: Sports Medicine

## 2023-07-10 ENCOUNTER — Telehealth (HOSPITAL_COMMUNITY): Payer: Self-pay

## 2023-07-10 NOTE — Telephone Encounter (Signed)
 Called pt to check in on his hip issues. Pt stated he is going for a PT eval on 07/12/23 and will let us  know if he can continue with the program or not.

## 2023-07-11 ENCOUNTER — Other Ambulatory Visit (HOSPITAL_COMMUNITY): Payer: Self-pay

## 2023-07-11 ENCOUNTER — Encounter (HOSPITAL_COMMUNITY): Admission: RE | Admit: 2023-07-11 | Source: Ambulatory Visit

## 2023-07-11 ENCOUNTER — Ambulatory Visit (HOSPITAL_COMMUNITY)

## 2023-07-11 NOTE — Therapy (Signed)
 OUTPATIENT PHYSICAL THERAPY THORACOLUMBAR EVALUATION   Patient Name: Paul Hardy MRN: [id number] DOB:[birth year], 63 y.o., male Today's Date: 07/11/2023  END OF SESSION:   Past Medical History:  Diagnosis Date   CHF (congestive heart failure) (HCC)    Coronary artery disease    Hypertension    Past Surgical History:  Procedure Laterality Date   CARDIAC SURGERY     CHOLECYSTECTOMY, LAPAROSCOPIC     VASECTOMY     Patient Active Problem List   Diagnosis Date Noted   Lumbar spondylosis 06/29/2023   Hypokalemia 04/22/2023   Enteritis 04/22/2023   Ileus (HCC) 04/22/2023   SBO (small bowel obstruction) (HCC) 04/21/2023   COPD (chronic obstructive pulmonary disease) (HCC) 03/16/2023   Reactive airway disease 04/28/2022   COVID 12/09/2021   Raynaud's disease without gangrene 09/28/2021   Syncopal episodes 08/26/2021   COPD exacerbation (HCC) 07/29/2021   Dyslipidemia 02/03/2021   Ischemic cardiomyopathy 01/18/2021   SOB (shortness of breath) 01/04/2021   Adjustment disorder 12/06/2020   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  ONSET DATE: 06/06/23  SUBJECTIVE:                                                                                                                                                                                           SUBJECTIVE STATEMENT: Patient reports that he started having R hip pain which radiates down the R leg to the R foot  about 4-5 weeks ago after he started working on an elliptical at pulmonary rehab. He was diagnosed with peripheral neuropathy in the past 2 months   PERTINENT HISTORY:  Pulmonary rehab for shortness of breath; CABG x 3 2022; graft collapsed ~ 2 months after OHS; HTN  PAIN:  Are you having pain? Yes: NPRS scale: 2/10; worst in past week 7/10 when pulling stretchers  Pain location: R posterior hip lateral mid thigh intermittent  Pain description: dull  Aggravating factors: pulling stretcher; fatigue  Relieving factors: rest   PRECAUTIONS: None  RED FLAGS: None   WEIGHT BEARING RESTRICTIONS: No  FALLS:  Has patient fallen in last 6 months? 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  DIAGNOSTIC FINDINGS:  Xray 06/29/23: 1. Minimal degenerative joint changes of lumbar spine. 2. Nonobstructing stone identified in the left kidney measuring 0.9 cm.  PATIENT SURVEYS:  Modified Oswestry 14/50; 28%    COGNITION: Overall cognitive status: Within functional limits for tasks assessed     SENSATION: None when patient takes his medication for peripheral neuropathy   MUSCLE LENGTH: Hamstrings: Right 60 deg; Left 60 deg  POSTURE: rounded shoulders, forward head, increased thoracic kyphosis, and flexed trunk; R LE in ER in standing           Patient sits with R LE crossed over L figure 4    PALPATION: Tightness posterior R hip through piriformis and gluts   LUMBAR ROM:   AROM eval  Flexion 40%   Extension 30%  Right lateral flexion 75%   Left lateral flexion 75%  Right rotation 50%   Left rotation 50%    (Blank rows = not tested)  LOWER EXTREMITY ROM:   end range tightness bilat hips; knees and ankles WFL's   Active  Right eval Left eval  Hip flexion    Hip extension    Hip abduction    Hip adduction    Hip internal rotation    Hip external rotation    Knee flexion    Knee extension    Ankle dorsiflexion    Ankle plantarflexion    Ankle inversion    Ankle eversion     (Blank rows = not tested)  LOWER EXTREMITY MMT:  WFL's bilat LE's   MMT Right eval Left eval  Hip flexion    Hip extension    Hip abduction    Hip adduction    Hip internal rotation    Hip external rotation    Knee flexion    Knee extension    Ankle dorsiflexion    Ankle plantarflexion    Ankle inversion    Ankle eversion     (Blank rows = not tested)  LUMBAR SPECIAL TESTS:  Straight leg raise test: Negative and Slump test: Negative  FUNCTIONAL TESTS:  5 times sit to stand: 15.98 sec  SLS L 6 sec; R 7 sec - in work boots   GAIT: Distance walked: 40 feet  Assistive device utilized: None Level of assistance: Complete Independence Comments: WFL's   TREATMENT DATE: 07/12/23  Review of eval findings; POC HEP as noted Myofacial ball release posterior hip standing Avoid sitting with legs crossed Avoid sleeping in prone if he pulls R LE up into abduction and ER with knee flexed  PATIENT EDUCATION:  Education details: POC; HEP  Person educated: Patient Education method: Programmer, multimedia, Demonstration, Actor cues, Verbal cues, and Handouts Education comprehension: verbalized understanding, returned demonstration, verbal cues required, tactile cues required, and needs further education  HOME EXERCISE PROGRAM: Access Code: [email or web address] URL:  [email or web address] Date: 07/12/2023 Prepared by: Jacqui Headen  Exercises - Supine Piriformis Stretch with Leg Straight  - 2 x daily - 7 x weekly - 1 sets - 3 reps - 30 sec  hold - Hooklying Hamstring Stretch with Strap  - 2 x daily - 7 x weekly - 1 sets - 3 reps - 30 sec  hold - Supine ITB Stretch with Strap  - 2 x daily - 7 x weekly - 1 sets - 3 reps - 30 sec  hold - Standing Piriformis Release with Ball at Wall  - 2 x daily - 7 x weekly - 30-60 sec  hold  ASSESSMENT:  CLINICAL IMPRESSION: Patient is a 63 y.o. male who was seen today for physical therapy evaluation and treatment for lumbar spondylosis. He reports onset of R posterior hip pain radiating to mid lateral thigh ~ 1 month ago after walking on an elliptical. Pain has persisted since that time. He has ER of R LE in standing; decreased hip and spine ROM/ mobility; tightness through the posterior R hip with palpation; tightness with piriformis stretch, hamstring, ITB on R. Symptoms are consistent with muscular tightness through the posterior R hip in piriformis and gluts. Patient will benefit form PT to address problems identified.    OBJECTIVE IMPAIRMENTS: decreased balance, decreased mobility, decreased ROM, increased fascial restrictions, impaired flexibility, improper body mechanics, postural dysfunction, and pain.   ACTIVITY LIMITATIONS: carrying, bending, squatting, and stairs  PARTICIPATION LIMITATIONS: occupation and yard work  PERSONAL FACTORS: Fitness, Past/current experiences, Profession, and Time since onset of injury/illness/exacerbation are also affecting patient's functional outcome.   REHAB POTENTIAL: Good  CLINICAL DECISION MAKING: Evolving/moderate complexity  EVALUATION COMPLEXITY: Moderate   GOALS: Goals reviewed with patient? Yes  SHORT TERM GOALS: Target date: 08/09/2023  Independent in initial HEP  Baseline: Goal status: INITIAL  2.  Patient reports 50-70% resolution of R LE symptoms   Baseline:  Goal status: INITIAL  3.  Patient reports and demonstrates sitting without crossing ankles or legs  Baseline:  Goal status: INITIAL   LONG TERM GOALS: Target date: 09/06/2023  Decrease pain by 75-100% allowing patient to return to all normal functional activities  Baseline:  Goal status: INITIAL  2.  Increase tissue extensibility through the R posterior hip and LE with patient to demonstrated HS flexibility to 70 deg to help avoid recurrent muscle irritation/injury  Baseline:  Goal status: INITIAL  3.  Patient reports ability to work for his full shift with no flare up of R posterior hip and thigh pain > 0-1/10  Baseline:  Goal status: INITIAL  4.  Independent in advanced HEP  Baseline:  Goal status: INITIAL  5.  Improve Modified Oswestry by 10 points  Baseline: 14/50; 28% Goal status: INITIAL  PLAN:  PT FREQUENCY: 2x/week  PT DURATION: 8 weeks  PLANNED INTERVENTIONS: 97164- PT Re-evaluation, 97110-Therapeutic exercises, 97530- Therapeutic activity, 97112- Neuromuscular re-education, 97535- Self Care, [id number]- Manual therapy, [phone]- Gait training, [phone]- Aquatic Therapy, Patient/Family education, Balance training, Stair training, Taping, Dry Needling, and Joint mobilization.  PLAN FOR NEXT SESSION: review and progress exercises; continue spine care and body mechanics; manual work and modalities as indicated    Jaunice Mirza P Ceniya Fowers, PT 07/11/2023, 5:54 PM

## 2023-07-12 ENCOUNTER — Other Ambulatory Visit: Payer: Self-pay

## 2023-07-12 ENCOUNTER — Encounter: Payer: Self-pay | Admitting: Rehabilitative and Restorative Service Providers"

## 2023-07-12 ENCOUNTER — Ambulatory Visit: Attending: Sports Medicine | Admitting: Rehabilitative and Restorative Service Providers"

## 2023-07-12 DIAGNOSIS — M47816 Spondylosis without myelopathy or radiculopathy, lumbar region: Secondary | ICD-10-CM | POA: Diagnosis not present

## 2023-07-12 DIAGNOSIS — M25651 Stiffness of right hip, not elsewhere classified: Secondary | ICD-10-CM

## 2023-07-12 DIAGNOSIS — R29898 Other symptoms and signs involving the musculoskeletal system: Secondary | ICD-10-CM | POA: Diagnosis present

## 2023-07-12 DIAGNOSIS — M25551 Pain in right hip: Secondary | ICD-10-CM

## 2023-07-13 ENCOUNTER — Telehealth (HOSPITAL_COMMUNITY): Payer: Self-pay

## 2023-07-13 ENCOUNTER — Ambulatory Visit (HOSPITAL_COMMUNITY)

## 2023-07-13 ENCOUNTER — Encounter (HOSPITAL_COMMUNITY)
Admission: RE | Admit: 2023-07-13 | Discharge: 2023-07-13 | Disposition: A | Discharge status: Home / Self Care | Source: Ambulatory Visit | Attending: Pulmonary Disease | Admitting: Pulmonary Disease

## 2023-07-13 NOTE — Telephone Encounter (Addendum)
 Called Paul Hardy to follow up after PT eval. Roshan is starting rehab with PT. I told him to focus on PT and he can come back to Pulmonary Rehab. Chaim voiced understanding of being discharged from Pulmonary Rehab. I told Oryan to request a new PR referral from Dr. Waylan Haggard. He voiced understanding. Pt will be discharged from PR

## 2023-07-18 ENCOUNTER — Ambulatory Visit (HOSPITAL_COMMUNITY)

## 2023-07-18 ENCOUNTER — Encounter (HOSPITAL_COMMUNITY)

## 2023-07-19 ENCOUNTER — Ambulatory Visit: Admitting: Rehabilitative and Restorative Service Providers"

## 2023-07-20 ENCOUNTER — Encounter (HOSPITAL_COMMUNITY)

## 2023-07-20 ENCOUNTER — Ambulatory Visit (HOSPITAL_COMMUNITY)

## 2023-07-20 NOTE — Progress Notes (Signed)
 Discharge Progress Report  Patient Details  Name: Paul Hardy MRN: [id number] Date of Birth: 12-29-1960 Referring Provider:   Gattis Kass Pulmonary Rehab Walk Test from 06/05/2023 in [facility]  Referring Provider Mannam        Number of Visits: 2  Reason for Discharge:  Early Exit:  Personal  Smoking History:  Social History   Tobacco Use  Smoking Status Former   Current packs/day: 0.00   Average packs/day: 1 pack/day for 22.8 years (22.8 ttl pk-yrs)   Types: Cigarettes   Start date: 02/21/1998   Quit date: 11/24/2020   Years since quitting: 2.6   Passive exposure: Never  Smokeless Tobacco Never    Diagnosis:  Centrilobular emphysema (HCC)  ADL UCSD:  Pulmonary Assessment Scores     Row Name 06/05/23 0918         ADL UCSD   ADL Phase Entry     SOB Score total 8       CAT Score   CAT Score 7       mMRC Score   mMRC Score 1              Initial Exercise Prescription:  Initial Exercise Prescription - 06/05/23 0900       Date of Initial Exercise RX and Referring Provider   Date 06/05/23    Referring Provider Mannam    Expected Discharge Date 08/31/23      Treadmill   MPH 2.5    Grade 0    Minutes 15    METs 2.5      Elliptical   Level 1    Speed 1    Minutes 15    METs 3      Prescription Details   Frequency (times per week) 2    Duration Progress to 30 minutes of continuous aerobic without signs/symptoms of physical distress      Intensity   THRR 40-80% of Max Heartrate 63-126    Ratings of Perceived Exertion 11-13    Perceived Dyspnea 0-4      Progression   Progression Continue to progress workloads to maintain intensity without signs/symptoms of physical distress.      Resistance Training   Training Prescription Yes    Weight blue bands    Reps 10-15             Discharge Exercise Prescription (Final Exercise Prescription Changes):  Exercise Prescription  Changes - 06/20/23 0900       Response to Exercise   Blood Pressure (Admit) 126/82    Blood Pressure (Exercise) 142/80    Blood Pressure (Exit) 108/60    Heart Rate (Admit) 68 bpm    Heart Rate (Exercise) 77 bpm    Heart Rate (Exit) 74 bpm    Oxygen Saturation (Admit) 96 %    Oxygen Saturation (Exercise) 97 %    Oxygen Saturation (Exit) 96 %    Rating of Perceived Exertion (Exercise) 10    Perceived Dyspnea (Exercise) 1      Progression   Progression Continue to progress workloads to maintain intensity without signs/symptoms of physical distress.      Resistance Training   Training Prescription Yes    Weight black bands    Reps 10-15    Time 10 Minutes      Treadmill   MPH 2.5    Grade 1    Minutes 15    METs 3.2  Elliptical   Level 1    Speed 1    Minutes 15    METs 4             Functional Capacity:  6 Minute Walk     Row Name 06/05/23 0924         6 Minute Walk   Phase Initial     Distance 1245 feet     Walk Time 6 minutes     # of Rest Breaks 0     MPH 0.94     METS 3.01     RPE 10.5     Perceived Dyspnea  0     VO2 Peak 10.55     Symptoms No     Resting HR 67 bpm     Resting BP 120/70     Resting Oxygen Saturation  95 %     Exercise Oxygen Saturation  during 6 min walk 93 %     Max Ex. HR 78 bpm     Max Ex. BP 134/80     2 Minute Post BP 118/74       Interval HR   1 Minute HR 74     2 Minute HR 74     3 Minute HR 77     4 Minute HR 78     5 Minute HR 77     6 Minute HR 77     2 Minute Post HR 68     Interval Heart Rate? Yes       Interval Oxygen   Interval Oxygen? Yes     Baseline Oxygen Saturation % 95 %     1 Minute Oxygen Saturation % 96 %     1 Minute Liters of Oxygen 0 L     2 Minute Oxygen Saturation % 93 %     2 Minute Liters of Oxygen 0 L     3 Minute Oxygen Saturation % 94 %     3 Minute Liters of Oxygen 0 L     4 Minute Oxygen Saturation % 94 %     4 Minute Liters of Oxygen 0 L     5 Minute Oxygen Saturation  % 95 %     5 Minute Liters of Oxygen 0 L     6 Minute Oxygen Saturation % 96 %     6 Minute Liters of Oxygen 0 L     2 Minute Post Oxygen Saturation % 95 %     2 Minute Post Liters of Oxygen 0 L              Psychological, QOL, Others - Outcomes: PHQ 2/9:    06/05/2023    9:11 AM 03/16/2023    8:20 AM 08/12/2022   10:43 AM 03/14/2022    9:01 AM 12/09/2021    9:33 AM  Depression screen PHQ 2/9  Decreased Interest 0 0 0 0 1  Down, Depressed, Hopeless 0 0 0 0 0  PHQ - 2 Score 0 0 0 0 1  Altered sleeping 1    1  Tired, decreased energy 1    1  Change in appetite 0    0  Feeling bad or failure about yourself  0    0  Trouble concentrating 0    0  Moving slowly or fidgety/restless 0    0  Suicidal thoughts 0    0  PHQ-9 Score 2    3  Difficult doing  work/chores Not difficult at all    Somewhat difficult    Quality of Life:   Personal Goals: Goals established at orientation with interventions provided to work toward goal.  Personal Goals and Risk Factors at Admission - 06/05/23 0923       Core Components/Risk Factors/Patient Goals on Admission    Weight Management Yes;Weight Loss    Intervention Weight Management: Develop a combined nutrition and exercise program designed to reach desired caloric intake, while maintaining appropriate intake of nutrient and fiber, sodium and fats, and appropriate energy expenditure required for the weight goal.;Weight Management: Provide education and appropriate resources to help participant work on and attain dietary goals.;Weight Management/Obesity: Establish reasonable short term and long term weight goals.;Obesity: Provide education and appropriate resources to help participant work on and attain dietary goals.    Admit Weight 222 lb 3.6 oz (100.8 kg)    Expected Outcomes Short Term: Continue to assess and modify interventions until short term weight is achieved;Long Term: Adherence to nutrition and physical activity/exercise program aimed  toward attainment of established weight goal;Weight Loss: Understanding of general recommendations for a balanced deficit meal plan, which promotes 1-2 lb weight loss per week and includes a negative energy balance of [phone] kcal/d;Understanding recommendations for meals to include 15-35% energy as protein, 25-35% energy from fat, 35-60% energy from carbohydrates, less than 200mg  of dietary cholesterol, 20-35 gm of total fiber daily;Understanding of distribution of calorie intake throughout the day with the consumption of 4-5 meals/snacks    Improve shortness of breath with ADL's Yes    Intervention Provide education, individualized exercise plan and daily activity instruction to help decrease symptoms of SOB with activities of daily living.    Expected Outcomes Short Term: Improve cardiorespiratory fitness to achieve a reduction of symptoms when performing ADLs;Long Term: Be able to perform more ADLs without symptoms or delay the onset of symptoms              Personal Goals Discharge:  Goals and Risk Factor Review     Row Name 06/06/23 1523 06/26/23 0950 07/20/23 0747         Core Components/Risk Factors/Patient Goals Review   Personal Goals Review Weight Management/Obesity;Improve shortness of breath with ADL's;Develop more efficient breathing techniques such as purse lipped breathing and diaphragmatic breathing and practicing self-pacing with activity. Weight Management/Obesity;Improve shortness of breath with ADL's;Develop more efficient breathing techniques such as purse lipped breathing and diaphragmatic breathing and practicing self-pacing with activity. Weight Management/Obesity;Improve shortness of breath with ADL's;Develop more efficient breathing techniques such as purse lipped breathing and diaphragmatic breathing and practicing self-pacing with activity.     Review Nahzir has not started PR yet. Unable to assess his goals at this time. Monthly review of patient's Core  Components/Risk Factors/Patient Goals are as follows: Goal progressing for improving shortness of breath with ADL's. Aidenjames is currently exercising on RA to maintain sats >88%. He is currently exercising on the treadmill and the elliptical. Goal progressing for developing more efficient breathing techniques such as purse lipped breathing and diaphragmatic breathing; and practicing self-pacing with activity. Goal progressing for weight loss. Jesaiah is working with staff dietitian to achieve his weight loss goals. Chanel was discharged from PR on 07/13/23 due to having to start PT for hip pain. Taysom did not meet any of his goals as he only attended 2 sessions.     Expected Outcomes To improve shortness of breath with ADL's, develop more efficient breathing techniques such as purse lipped breathing and  diaphragmatic breathing; and practicing self-pacing with activity and lose weight. To improve shortness of breath with ADL's, develop more efficient breathing techniques such as purse lipped breathing and diaphragmatic breathing; and practicing self-pacing with activity and lose weight. Pt will continue to apply the knowledge he learned in PR              Exercise Goals and Review:  Exercise Goals     Row Name 06/05/23 0901             Exercise Goals   Increase Physical Activity Yes       Intervention Provide advice, education, support and counseling about physical activity/exercise needs.;Develop an individualized exercise prescription for aerobic and resistive training based on initial evaluation findings, risk stratification, comorbidities and participant's personal goals.       Expected Outcomes Short Term: Attend rehab on a regular basis to increase amount of physical activity.;Long Term: Exercising regularly at least 3-5 days a week.;Long Term: Add in home exercise to make exercise part of routine and to increase amount of physical activity.       Increase Strength and Stamina Yes        Intervention Provide advice, education, support and counseling about physical activity/exercise needs.;Develop an individualized exercise prescription for aerobic and resistive training based on initial evaluation findings, risk stratification, comorbidities and participant's personal goals.       Expected Outcomes Short Term: Increase workloads from initial exercise prescription for resistance, speed, and METs.;Short Term: Perform resistance training exercises routinely during rehab and add in resistance training at home;Long Term: Improve cardiorespiratory fitness, muscular endurance and strength as measured by increased METs and functional capacity ( )       Able to understand and use rate of perceived exertion (RPE) scale Yes       Intervention Provide education and explanation on how to use RPE scale       Expected Outcomes Short Term: Able to use RPE daily in rehab to express subjective intensity level;Long Term:  Able to use RPE to guide intensity level when exercising independently       Able to understand and use Dyspnea scale Yes       Intervention Provide education and explanation on how to use Dyspnea scale       Expected Outcomes Short Term: Able to use Dyspnea scale daily in rehab to express subjective sense of shortness of breath during exertion;Long Term: Able to use Dyspnea scale to guide intensity level when exercising independently       Knowledge and understanding of Target Heart Rate Range (THRR) Yes       Intervention Provide education and explanation of THRR including how the numbers were predicted and where they are located for reference       Expected Outcomes Short Term: Able to state/look up THRR;Long Term: Able to use THRR to govern intensity when exercising independently;Short Term: Able to use daily as guideline for intensity in rehab       Understanding of Exercise Prescription Yes       Intervention Provide education, explanation, and written materials on patient's  individual exercise prescription       Expected Outcomes Short Term: Able to explain program exercise prescription;Long Term: Able to explain home exercise prescription to exercise independently                Exercise Goals Re-Evaluation:  Exercise Goals Re-Evaluation     Row Name 06/06/23 1533 06/28/23 0953 07/14/23 1037  Exercise Goal Re-Evaluation   Exercise Goals Review Increase Physical Activity;Able to understand and use Dyspnea scale;Understanding of Exercise Prescription;Increase Strength and Stamina;Knowledge and understanding of Target Heart Rate Range (THRR);Able to understand and use rate of perceived exertion (RPE) scale Increase Physical Activity;Able to understand and use Dyspnea scale;Understanding of Exercise Prescription;Increase Strength and Stamina;Knowledge and understanding of Target Heart Rate Range (THRR);Able to understand and use rate of perceived exertion (RPE) scale Increase Physical Activity;Able to understand and use Dyspnea scale;Understanding of Exercise Prescription;Increase Strength and Stamina;Knowledge and understanding of Target Heart Rate Range (THRR);Able to understand and use rate of perceived exertion (RPE) scale     Comments Ethyn is scheduled to begin exercise on 4/22. Will continue to monitor and progress as able. Marshal has completed 2 exercise sessions. He exercises for 15 min on the upright elliptical and treadmill. He averages 4 METs at level and speed 1 on the upright elliptical and 3.1 METs at 2.5 mph and 1 on the treadmill. He performes the warmup and cooldown standing without limitations. Kycen has had inconsistent attendance due to work. Will continue monitor and progress as able. Maron completed 2 exercise sessions. Peak METs were 4.1 on upright elliptical and 3.1 on treadimll. Pt being discharged to complete physical therapy.     Expected Outcomes Through exercise at rehab and home, the patient will decrease shortness of breath with  daily activities and feel confident in carrying out an exercise regimen at home. Through exercise at rehab and home, the patient will decrease shortness of breath with daily activities and feel confident in carrying out an exercise regimen at home. Through exercise at rehab and home, the patient will decrease shortness of breath with daily activities and feel confident in carrying out an exercise regimen at home.              Nutrition & Weight - Outcomes:  Pre Biometrics - 06/05/23 0900       Pre Biometrics   Grip Strength 42 kg              Nutrition:  Nutrition Therapy & Goals - 06/13/23 1013       Nutrition Therapy   Diet Heart healthy diet    Drug/Food Interactions Statins/Certain Fruits      Personal Nutrition Goals   Nutrition Goal Patient to improve diet quality by using the plate method as a guide for meal planning to include lean protein/plant protein, fruits, vegetables, whole grains, nonfat dairy as part of a well-balanced diet.    Comments Kasyn has medical history of COPD, Centrilobular emhysema, HTN, STEMI, s/p CABGx3, OSA on CPAP, CKD3. He has previously completed cardiac rehab in 2023. LDL is well controlled. He does report some motivation to lose weight; will continue to discuss strategies for weight loss including calorie density, benefits of high fiber/high protein intake, the plate method,etc. Patient will benefit from participation in pulmonary rehab for nutrition, exercise, and lifestyle modification.      Intervention Plan   Intervention Prescribe, educate and counsel regarding individualized specific dietary modifications aiming towards targeted core components such as weight, hypertension, lipid management, diabetes, heart failure and other comorbidities.;Nutrition handout(s) given to patient.    Expected Outcomes Short Term Goal: Understand basic principles of dietary content, such as calories, fat, sodium, cholesterol and nutrients.;Long Term Goal:  Adherence to prescribed nutrition plan.             Nutrition Discharge:   Education Questionnaire Score:  Knowledge Questionnaire Score - 06/05/23 1610  Knowledge Questionnaire Score   Pre Score 16/18             Goals reviewed with patient; copy given to patient.

## 2023-07-20 NOTE — Addendum Note (Signed)
 Encounter addended by: [name], RRT on: 07/20/2023 7:53 AM  Actions taken: Flowsheet accepted, Episode resolved, Clinical Note Signed

## 2023-07-24 ENCOUNTER — Ambulatory Visit: Attending: Sports Medicine | Admitting: Rehabilitative and Restorative Service Providers"

## 2023-07-24 ENCOUNTER — Other Ambulatory Visit (HOSPITAL_COMMUNITY): Payer: Self-pay

## 2023-07-24 ENCOUNTER — Encounter: Payer: Self-pay | Admitting: Rehabilitative and Restorative Service Providers"

## 2023-07-24 DIAGNOSIS — M25651 Stiffness of right hip, not elsewhere classified: Secondary | ICD-10-CM | POA: Diagnosis present

## 2023-07-24 DIAGNOSIS — R29898 Other symptoms and signs involving the musculoskeletal system: Secondary | ICD-10-CM | POA: Diagnosis present

## 2023-07-24 DIAGNOSIS — M25551 Pain in right hip: Secondary | ICD-10-CM

## 2023-07-24 NOTE — Therapy (Addendum)
 OUTPATIENT PHYSICAL THERAPY THORACOLUMBAR TREATMENT PHYSICAL THERAPY DISCHARGE SUMMARY  Visits from Start of Care: 2  Current functional level related to goals / functional outcomes: See progress note for discharge status    Remaining deficits: Unknown    Education / Equipment: HEP    Patient agrees to discharge. Patient goals were not met. Patient is being discharged due to not returning since the last visit. Samarion Ehle P. Ina PT, MPH 10/31/23 3:28 PM     Patient Name: Nile Prisk MRN: [id number] DOB:[birth year], 63 y.o., male Today's Date: 07/24/2023  END OF SESSION:  PT End of Session - 07/24/23 0935     Visit Number 2    Number of Visits 16    Date for PT Re-Evaluation 09/06/23    Authorization Type [place] save plan    PT Start Time 0933    PT Stop Time 1015    PT Time Calculation (min) 42 min    Activity Tolerance Patient tolerated treatment well             Past Medical History:  Diagnosis Date   CHF (congestive heart failure) (HCC)    Coronary artery disease    Hypertension    Past Surgical History:  Procedure Laterality Date   CARDIAC SURGERY     CHOLECYSTECTOMY, LAPAROSCOPIC     VASECTOMY     Patient Active Problem List   Diagnosis Date Noted   Lumbar spondylosis 06/29/2023   Hypokalemia 04/22/2023   Enteritis 04/22/2023   Ileus (HCC) 04/22/2023   SBO (small bowel obstruction) (HCC) 04/21/2023   COPD (chronic obstructive pulmonary disease) (HCC) 03/16/2023   Reactive airway disease 04/28/2022   COVID 12/09/2021   Raynaud's disease without gangrene 09/28/2021   Syncopal episodes 08/26/2021   COPD exacerbation (HCC) 07/29/2021   Dyslipidemia 02/03/2021   Ischemic cardiomyopathy 01/18/2021   SOB (shortness of breath) 01/04/2021   Adjustment disorder 12/06/2020   S/P CABG x 3 12/02/2020   Alcohol use 11/26/2020   STEMI (ST elevation myocardial infarction) (HCC) 11/25/2020   Well adult exam 10/21/2020   ED (erectile dysfunction)  09/23/2020   Gastroesophageal reflux disease without esophagitis 09/23/2020   Chronic kidney disease (CKD), stage III (moderate) (HCC) 09/24/2018   Essential hypertension 09/12/2016    PCP: Dr Velma Ku   REFERRING PROVIDER: Dr Debby JINNY Daniel   REFERRING DIAG: Lumbar spondylosis   Rationale for Evaluation and Treatment: Rehabilitation  THERAPY DIAG:  Pain in right hip  Other symptoms and signs involving the musculoskeletal system  Stiffness of right hip, not elsewhere classified  ONSET DATE: 06/06/23  SUBJECTIVE:  SUBJECTIVE STATEMENT: Patient reports that he is having pain in both hips which he feels is due to the exercises. It may be soreness as much as pain. Pain in the L hip is only with exercises. Not sure if he is overdoing the stretching. Has an occasional numbness in his foot but that was there before he hurt his back. He is doing exercises daily. Just getting off work for 24 hours so he is tired which may make everything worse.   EVAL: Patient reports that he started having R hip pain which radiates down the R leg to the R foot about 4-5 weeks ago after he started working on an elliptical at pulmonary rehab. He was diagnosed with peripheral neuropathy in the past 2 months   PERTINENT HISTORY:  Pulmonary rehab for shortness of breath; CABG x 3 2022; graft collapsed ~ 2 months after OHS; HTN  PAIN:  Are you having pain? Yes: NPRS scale: 4/10; worst in past week 7/10 when pulling stretchers  Pain location: R posterior hip lateral mid thigh intermittent  Pain description: dull  Aggravating factors: pulling stretcher; fatigue  Relieving factors: rest   PRECAUTIONS: None  WEIGHT BEARING RESTRICTIONS: No  FALLS:  Has patient fallen in last 6 months? 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  DIAGNOSTIC FINDINGS:  Xray 06/29/23: 1. Minimal degenerative joint changes of lumbar spine. 2. Nonobstructing stone identified in the left kidney measuring 0.9 cm.  PATIENT SURVEYS:  Modified Oswestry 14/50; 28%    SENSATION: None when patient takes his medication for peripheral neuropathy   MUSCLE LENGTH: Hamstrings: Right 60 deg; Left 60 deg  POSTURE: rounded shoulders, forward head, increased thoracic kyphosis, and flexed trunk; R LE in ER in standing           Patient sits with R LE crossed over L figure 4    PALPATION: Tightness posterior R hip through piriformis and gluts   LUMBAR ROM:   AROM eval  Flexion 40%  Extension 30%  Right lateral flexion 75%   Left lateral flexion 75%  Right rotation 50%   Left rotation 50%    (Blank rows = not tested)  LOWER EXTREMITY ROM:   end range tightness bilat hips; knees and ankles WFL's   Active  Right eval Left eval  Hip flexion    Hip extension    Hip abduction    Hip adduction    Hip internal rotation    Hip external rotation    Knee flexion    Knee extension    Ankle dorsiflexion    Ankle plantarflexion    Ankle inversion    Ankle eversion     (Blank rows = not tested)  LOWER EXTREMITY MMT:  WFL's bilat LE's   MMT Right eval Left eval  Hip flexion    Hip extension    Hip abduction    Hip adduction    Hip internal rotation    Hip external rotation    Knee flexion    Knee extension    Ankle dorsiflexion  Ankle plantarflexion    Ankle inversion    Ankle eversion     (Blank rows = not tested)  LUMBAR SPECIAL TESTS:  Straight leg raise test: Negative and Slump test: Negative  FUNCTIONAL TESTS:  5 times sit to stand: 15.98 sec  SLS L 6 sec; R 7 sec - in work boots   GAIT: Distance walked: 40 feet  Assistive device utilized: None Level of assistance: Complete Independence Comments: WFL's   [facility] Adult PT Treatment:                                                DATE: 07/24/23 Therapeutic Exercise: Nustep L 5 x 6 min  Sitting  Supine  Trunk rotation to patient tolerance x 10 R/L  Hamstring stretch with strap 30 sec x 2 R/L  ITB stretch supine with strap 30 sec x 2 R/L  Standing  Manual Therapy: STM/Deep tissue work through the R> L hamstring through insertion area into the belly of the hamstring  Neuromuscular re-ed: Core activation - 4 part core   Therapeutic Activity: Supine Bridging 5 sec x 5 x 2 sets   Sitting  Hamstring stretch sitting 30 sec x 3 R/L  Standing Walking between exercises to release tightness and improve normal movement patterns  Self Care: Myofacial ball release R hamstrings in sitting  Encouraged continued consistent exercise without overdoing     TREATMENT DATE: 07/12/23  Review of eval findings; POC HEP as noted Myofacial ball release posterior hip standing Avoid sitting with legs crossed Avoid sleeping in prone if he pulls R LE up into abduction and ER with knee flexed                                                                                                                                PATIENT EDUCATION:  Education details: POC; HEP  Person educated: Patient Education method: Programmer, multimedia, Demonstration, Tactile cues, Verbal cues, and Handouts Education comprehension: verbalized understanding, returned demonstration, verbal cues required, tactile cues required, and needs further education  HOME EXERCISE PROGRAM: Access Code:  [email or web address] URL: [email or web address] Date: 07/24/2023 Prepared by: Blu Lori  Exercises - Supine Piriformis Stretch with Leg Straight  - 2 x daily - 7 x weekly - 1 sets - 3 reps - 30 sec  hold - Hooklying Hamstring Stretch with Strap  - 2 x daily - 7 x weekly - 1 sets - 3 reps - 30 sec  hold - Supine ITB Stretch with Strap  - 2 x daily - 7 x weekly - 1 sets - 3 reps - 30 sec  hold - Standing Piriformis Release with Ball at [employer]  - 2 x daily - 7 x weekly - 30-60 sec  hold - Supine Transversus Abdominis Bracing with Pelvic Floor Contraction  - 2 x  daily - 7 x weekly - 1 sets - 10 reps - 10sec  hold - Supine Lower Trunk Rotation  - 1-2 x daily - 7 x weekly - 1 sets - 3-5 reps - 20-30 sec  hold - Supine Bridge  - 1 x daily - 7 x weekly - 1-2 sets - 10 reps - 5-10 sec  hold - Seated Hamstring Stretch  - 2 x daily - 7 x weekly - 1 sets - 3 reps - 30 sec  hold  ASSESSMENT:  CLINICAL IMPRESSION: Patient returns with report of increased soreness and/or pain in both hips which he feels is related to the exercises. The pain or soreness in the L hip resolves when he is not exercising. Noted tightness in the origin of hamstrings at ischial tuberosity with palpation. Patient tolerated STM/deep pressure release with manual work. Added myofacial ball release for hamstrings in sitting. Note significant muscular tightness throughout bilat LE's. Added core activation and gentle movement, progressing to strengthening, reviewed stretching modifying as indicated. Tolerated exercise well. Discussed avoiding overdoing the stretches. Encouraged consistent exercise avoiding pain.    EVAL: Patient is a 63 y.o. male who was seen today for physical therapy evaluation and treatment for lumbar spondylosis. He reports onset of R posterior hip pain radiating to mid lateral thigh ~ 1 month ago after walking on an elliptical. Pain has persisted since that time. He has ER of R LE in standing; decreased hip and spine  ROM/ mobility; tightness through the posterior R hip with palpation; tightness with piriformis stretch, hamstring, ITB on R. Symptoms are consistent with muscular tightness through the posterior R hip in piriformis and gluts. Patient will benefit form PT to address problems identified.    GOALS: Goals reviewed with patient? Yes  SHORT TERM GOALS: Target date: 08/09/2023  Independent in initial HEP  Baseline: Goal status: INITIAL  2.  Patient reports 50-70% resolution of R LE symptoms  Baseline:  Goal status: INITIAL  3.  Patient reports and demonstrates sitting without crossing ankles or legs  Baseline:  Goal status: INITIAL   LONG TERM GOALS: Target date: 09/06/2023  Decrease pain by 75-100% allowing patient to return to all normal functional activities  Baseline:  Goal status: INITIAL  2.  Increase tissue extensibility through the R posterior hip and LE with patient to demonstrated HS flexibility to 70 deg to help avoid recurrent muscle irritation/injury  Baseline:  Goal status: INITIAL  3.  Patient reports ability to work for his full shift with no flare up of R posterior hip and thigh pain > 0-1/10  Baseline:  Goal status: INITIAL  4.  Independent in advanced HEP  Baseline:  Goal status: INITIAL  5.  Improve Modified Oswestry by 10 points  Baseline: 14/50; 28% Goal status: INITIAL  PLAN:  PT FREQUENCY: 2x/week  PT DURATION: 8 weeks  PLANNED INTERVENTIONS: 97164- PT Re-evaluation, 97110-Therapeutic exercises, 97530- Therapeutic activity, 97112- Neuromuscular re-education, 97535- Self Care, 02859- Manual therapy, [phone]- Gait training, [phone]- Aquatic Therapy, Patient/Family education, Balance training, Stair training, Taping, Dry Needling, and Joint mobilization.  PLAN FOR NEXT SESSION: review and progress exercises; continue spine care and body mechanics; manual work and modalities as indicated    Auston Halfmann P Partick Musselman, PT 07/24/2023, 9:35 AM

## 2023-07-25 ENCOUNTER — Encounter (HOSPITAL_COMMUNITY)

## 2023-07-25 ENCOUNTER — Ambulatory Visit (HOSPITAL_COMMUNITY)

## 2023-07-26 ENCOUNTER — Ambulatory Visit: Admitting: Rehabilitative and Restorative Service Providers"

## 2023-07-27 ENCOUNTER — Ambulatory Visit (HOSPITAL_COMMUNITY)

## 2023-07-27 ENCOUNTER — Encounter (HOSPITAL_COMMUNITY)

## 2023-07-28 ENCOUNTER — Ambulatory Visit: Admitting: Urology

## 2023-07-28 ENCOUNTER — Encounter: Payer: Self-pay | Admitting: Urology

## 2023-07-28 VITALS — BP 116/75 | HR 70 | Ht 73.0 in | Wt 220.0 lb

## 2023-07-28 DIAGNOSIS — N281 Cyst of kidney, acquired: Secondary | ICD-10-CM | POA: Diagnosis not present

## 2023-07-28 DIAGNOSIS — N138 Other obstructive and reflux uropathy: Secondary | ICD-10-CM | POA: Diagnosis not present

## 2023-07-28 DIAGNOSIS — N401 Enlarged prostate with lower urinary tract symptoms: Secondary | ICD-10-CM | POA: Diagnosis not present

## 2023-07-28 LAB — MICROSCOPIC EXAMINATION

## 2023-07-28 LAB — URINALYSIS, ROUTINE W REFLEX MICROSCOPIC
Bilirubin, UA: NEGATIVE
Glucose, UA: NEGATIVE
Ketones, UA: NEGATIVE
Leukocytes,UA: NEGATIVE
Nitrite, UA: NEGATIVE
RBC, UA: NEGATIVE
Specific Gravity, UA: >1.03 — ABNORMAL HIGH (ref 1.005–1.030)
Urobilinogen, Ur: 2 mg/dL — ABNORMAL HIGH (ref 0.2–1.0)
pH, UA: 6 (ref 5.0–7.5)

## 2023-07-28 NOTE — Progress Notes (Signed)
 Assessment: 1. BPH with obstruction/lower urinary tract symptoms   2. 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  He would like to continue with his current medical therapy at this time. Continue alfuzosin  10 mg daily.   PSA today Return to office in 3 months  Chief Complaint: Chief Complaint  Patient presents with   Benign Prostatic Hypertrophy    HPI: Paul Hardy is a 63 y.o. male who presents for continued evaluation of a right renal cyst and BPH with LUTS. He was found to have a small minimally complex right renal cyst. 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 Patient has pmhx of CAD s/p CABG, hypertension, dyslipidemia, obstructive sleep apnea, cannot tolerate CPAP mask.     Patient also has stage III CKD with recent creatinine 1.62/GFR = 48   Patient was found to have a 2.1 cm minimally complex right renal cystic lesion classified as Bosniak 67F in October 2023.  Also noted to have a 10mm left nonobstructing renal calculus.  He saw a urologist at that time at [facility] who recommended ongoing follow-up given its classification.  MRI abdomen with and without contrast from 2/24 showed small Bosniak category 1 and 2 cyst in both kidneys.  No additional follow-up for the cyst was recommended. CT abdomen and pelvis without contrast from 2/25 for evaluation abdominal pain showed a 10 mm left renal calculus without obstruction and stable bilateral renal cysts.  He has a history of BPH with lower urinary tract symptoms and had been managed with tamsulosin . PSA from 6/24: 0.96  At his visit in April 2025, he continued on tamsulosin .  He reported some increase in his urinary symptoms.  His lower urinary tract symptoms remained intermittent in nature.  He noted frequency after taking his Lasix in  the morning.  He reported difficulty with a slow stream.  No dysuria or gross hematuria. IPSS = 13/3. PVR = 39 mL  He was changed to alfuzosin  in April 2025.  He returns today for follow-up.  He continues on alfuzosin .  He has not seen a significant improvement in his lower urinary tract symptoms with the medication.  Overall, his lower urinary tract symptoms are stable.  No dysuria or gross hematuria.  No side effects from the medication. IPSS = 14/3.  Portions of the above documentation were copied from a prior visit for review purposes only.  Allergies: Allergies  Allergen Reactions   Bee Venom Anaphylaxis   Iodinated Contrast Media Anaphylaxis    Rash and Shortness of Breath   Hydrochlorothiazide Other (See Comments)    Dehydration and muscle spasms. Lasix does the same   Lisinopril Other (See Comments) and Cough    Losartan  is OK    PMH: Past Medical History:  Diagnosis Date   CHF (congestive heart failure) (HCC)    Coronary artery disease    Hypertension     PSH: Past Surgical History:  Procedure Laterality Date   CARDIAC SURGERY     CHOLECYSTECTOMY, LAPAROSCOPIC     VASECTOMY      SH: Social History   Tobacco Use   Smoking status: Former    Current packs/day: 0.00    Average packs/day: 1 pack/day for 22.8 years (22.8 ttl pk-yrs)    Types: Cigarettes    Start date: 02/21/1998    Quit  date: 11/24/2020    Years since quitting: 2.6    Passive exposure: Never   Smokeless tobacco: Never  Vaping Use   Vaping status: Never Used  Substance Use Topics   Alcohol use: Yes    Comment: social drinker   Drug use: Never    ROS: Constitutional:  Negative for fever, chills, weight loss CV: Negative for chest pain, previous MI, hypertension Respiratory:  Negative for shortness of breath, wheezing, sleep apnea, frequent cough GI:  Negative for nausea, vomiting, bloody stool, GERD  PE: BP 116/75   Pulse 70   Ht 6\' 1"  (1.854 m)   Wt 220 lb (99.8 kg)   BMI 29.03  kg/m  GENERAL APPEARANCE:  Well appearing, well developed, well nourished, NAD HEENT:  Atraumatic, normocephalic, oropharynx clear NECK:  Supple without lymphadenopathy or thyromegaly ABDOMEN:  Soft, non-tender, no masses EXTREMITIES:  Moves all extremities well, without clubbing, cyanosis, or edema NEUROLOGIC:  Alert and oriented x 3, normal gait, CN II-XII grossly intact MENTAL STATUS:  appropriate BACK:  Non-tender to palpation, No CVAT SKIN:  Warm, dry, and intact  Results: U/A: 0-5 WBCs, 0-2 RBCs

## 2023-07-29 LAB — PSA: Prostate Specific Ag, Serum: 1.3 ng/mL (ref 0.0–4.0)

## 2023-07-31 ENCOUNTER — Ambulatory Visit: Payer: Self-pay | Admitting: Urology

## 2023-08-01 ENCOUNTER — Ambulatory Visit (HOSPITAL_COMMUNITY)

## 2023-08-01 ENCOUNTER — Encounter (HOSPITAL_COMMUNITY)

## 2023-08-03 ENCOUNTER — Ambulatory Visit (HOSPITAL_COMMUNITY)

## 2023-08-03 ENCOUNTER — Encounter (HOSPITAL_COMMUNITY)

## 2023-08-07 ENCOUNTER — Other Ambulatory Visit (HOSPITAL_COMMUNITY): Payer: Self-pay

## 2023-08-08 ENCOUNTER — Encounter (HOSPITAL_COMMUNITY)

## 2023-08-08 ENCOUNTER — Ambulatory Visit (HOSPITAL_COMMUNITY)

## 2023-08-09 ENCOUNTER — Other Ambulatory Visit (HOSPITAL_COMMUNITY): Payer: Self-pay

## 2023-08-10 ENCOUNTER — Other Ambulatory Visit (HOSPITAL_COMMUNITY): Payer: Self-pay

## 2023-08-10 ENCOUNTER — Encounter (HOSPITAL_COMMUNITY)

## 2023-08-10 ENCOUNTER — Ambulatory Visit (HOSPITAL_COMMUNITY)

## 2023-08-10 ENCOUNTER — Other Ambulatory Visit: Payer: Self-pay

## 2023-08-10 ENCOUNTER — Ambulatory Visit (INDEPENDENT_AMBULATORY_CARE_PROVIDER_SITE_OTHER): Admitting: Sports Medicine

## 2023-08-10 DIAGNOSIS — M47816 Spondylosis without myelopathy or radiculopathy, lumbar region: Secondary | ICD-10-CM | POA: Diagnosis not present

## 2023-08-10 MED ORDER — GABAPENTIN 300 MG PO CAPS
ORAL_CAPSULE | ORAL | 3 refills | Status: AC
  Filled 2023-08-10: qty 90, fill #0
  Filled 2023-08-17: qty 90, 30d supply, fill #0
  Filled 2023-09-09: qty 90, 7d supply, fill #1
  Filled 2023-09-12: qty 90, 30d supply, fill #1
  Filled 2023-10-25: qty 90, 30d supply, fill #2
  Filled 2023-11-19 – 2023-11-22 (×2): qty 90, 30d supply, fill #3

## 2023-08-10 NOTE — Assessment & Plan Note (Signed)
 Right side chronic axial low back pain, burning sensation down to the back of the foot. X-rays with widespread spondylosis, CT with right L5-S1 facet arthropathy. At this point he has failed greater than 6 weeks of physical therapy. He really needs to work on hamstring strengthening as this is drawing him into more of a severe lumbar lordosis. We will give him some additional home conditioning as formal PT was not effective, proceed with MRI for epidural planning, likely right L5-S1. He also needs to uptitrate gabapentin , only doing 300 mg at night. He would like me to order the epidural as soon as we see the MRI results, I will see him back 6 weeks after the epidural.

## 2023-08-10 NOTE — Progress Notes (Signed)
    Procedures performed today:    None.  Independent interpretation of notes and tests performed by another provider:   None.  Brief History, Exam, Impression, and Recommendations:    Lumbar spondylosis Right side chronic axial low back pain, burning sensation down to the back of the foot. X-rays with widespread spondylosis, CT with right L5-S1 facet arthropathy. At this point he has failed greater than 6 weeks of physical therapy. He really needs to work on hamstring strengthening as this is drawing him into more of a severe lumbar lordosis. We will give him some additional home conditioning as formal PT was not effective, proceed with MRI for epidural planning, likely right L5-S1. He also needs to uptitrate gabapentin , only doing 300 mg at night. He would like me to order the epidural as soon as we see the MRI results, I will see him back 6 weeks after the epidural.    ____________________________________________ Joselyn Nicely. Sandy Crumb, M.D., ABFM., CAQSM., AME. Primary Care and Sports Medicine [facility]  Adjunct Professor of [facility]  [employer]  Restaurant manager, fast food

## 2023-08-13 ENCOUNTER — Ambulatory Visit (INDEPENDENT_AMBULATORY_CARE_PROVIDER_SITE_OTHER)

## 2023-08-13 DIAGNOSIS — M47816 Spondylosis without myelopathy or radiculopathy, lumbar region: Secondary | ICD-10-CM

## 2023-08-13 DIAGNOSIS — N281 Cyst of kidney, acquired: Secondary | ICD-10-CM | POA: Diagnosis not present

## 2023-08-13 DIAGNOSIS — M4727 Other spondylosis with radiculopathy, lumbosacral region: Secondary | ICD-10-CM | POA: Diagnosis not present

## 2023-08-15 ENCOUNTER — Encounter (HOSPITAL_COMMUNITY)

## 2023-08-15 ENCOUNTER — Ambulatory Visit (HOSPITAL_COMMUNITY)

## 2023-08-17 ENCOUNTER — Encounter (HOSPITAL_COMMUNITY)

## 2023-08-17 ENCOUNTER — Other Ambulatory Visit: Payer: Self-pay

## 2023-08-17 ENCOUNTER — Ambulatory Visit (HOSPITAL_COMMUNITY)

## 2023-08-17 ENCOUNTER — Other Ambulatory Visit (HOSPITAL_COMMUNITY): Payer: Self-pay

## 2023-08-17 ENCOUNTER — Other Ambulatory Visit: Payer: Self-pay | Admitting: Family Medicine

## 2023-08-17 DIAGNOSIS — Z951 Presence of aortocoronary bypass graft: Secondary | ICD-10-CM

## 2023-08-17 DIAGNOSIS — I255 Ischemic cardiomyopathy: Secondary | ICD-10-CM

## 2023-08-17 NOTE — Telephone Encounter (Signed)
 Copied from CRM [id number]. Topic: Clinical - Prescription Issue >> Aug 17, 2023  4:10 PM Brittney F wrote: Reason for CRM:   Patient's wife, Asberry, called in stating that the patient pharmacy has yet to receive the patient prescription for his gabapentin  (NEURONTIN ) 300 MG capsule. Agent did review the prescription listed and realized it was sent on 08/10/2023 but we never received an e-receipt confirming it had been received from the pharmacy.   The patient will be out of the prescription on Sunday so it is imperative that he receives it before the end of the week.   Please resubmit prescription to preferred pharmacy and contact patient once updated.   Preferred Pharmacy:   [facility] - [facility] [address] [place] [address] Phone: [phone] Fax: [phone] Hours: Mon-Fri 7:30am-6pm; Sat 8:00am-4:30pm   Callback Number: [phone]

## 2023-08-17 NOTE — Telephone Encounter (Signed)
 Spoke with pharmacist at [facility] .  She is going to contact [employer] as they are not allowing the fill with the strength increase. She will send  a secure chat to me once she has spoken with the insurance co.  Patient has been informed that this is all in process and that I will reach out to him once we get a resolution.

## 2023-08-18 ENCOUNTER — Other Ambulatory Visit: Payer: Self-pay

## 2023-08-18 ENCOUNTER — Other Ambulatory Visit (HOSPITAL_COMMUNITY): Payer: Self-pay

## 2023-08-18 MED ORDER — CLOPIDOGREL BISULFATE 75 MG PO TABS
75.0000 mg | ORAL_TABLET | Freq: Every day | ORAL | 0 refills | Status: DC
  Filled 2023-08-18 – 2023-09-30 (×3): qty 90, 90d supply, fill #0

## 2023-08-18 NOTE — Telephone Encounter (Signed)
 Last seen 02/2023. Unsure if pt is to continue antiplatelet Rx.

## 2023-08-22 ENCOUNTER — Ambulatory Visit (HOSPITAL_COMMUNITY)

## 2023-08-22 ENCOUNTER — Encounter (HOSPITAL_COMMUNITY)

## 2023-08-23 ENCOUNTER — Other Ambulatory Visit (HOSPITAL_COMMUNITY): Payer: Self-pay

## 2023-08-23 ENCOUNTER — Other Ambulatory Visit: Payer: Self-pay

## 2023-08-23 NOTE — Telephone Encounter (Signed)
 Attempted call to patient. Left a voice mail message requesting a return call.  Calling to see if he was able to get the Gabapentin  prescription last week.

## 2023-08-24 ENCOUNTER — Encounter (HOSPITAL_COMMUNITY)

## 2023-08-24 ENCOUNTER — Other Ambulatory Visit (HOSPITAL_COMMUNITY): Payer: Self-pay

## 2023-08-24 ENCOUNTER — Telehealth: Payer: Self-pay

## 2023-08-24 ENCOUNTER — Other Ambulatory Visit: Payer: Self-pay

## 2023-08-24 ENCOUNTER — Ambulatory Visit (HOSPITAL_COMMUNITY)

## 2023-08-24 ENCOUNTER — Ambulatory Visit: Payer: Self-pay | Admitting: Sports Medicine

## 2023-08-24 NOTE — Telephone Encounter (Signed)
 Patient does have the medication per his wife.

## 2023-08-24 NOTE — Telephone Encounter (Signed)
 Copied from CRM [id number]. Topic: Clinical - Prescription Issue >> Aug 17, 2023  4:10 PM Brittney F wrote: Reason for CRM:   Patient's wife, Asberry, called in stating that the patient pharmacy has yet to receive the patient prescription for his gabapentin  (NEURONTIN ) 300 MG capsule. Agent did review the prescription listed and realized it was sent on 08/10/2023 but we never received an e-receipt confirming it had been received from the pharmacy.   The patient will be out of the prescription on Sunday so it is imperative that he receives it before the end of the week.   Please resubmit prescription to preferred pharmacy and contact patient once updated.   Preferred Pharmacy:   [facility] - [facility] [address] [place] [address] Phone: [phone] Fax: [phone] Hours: Mon-Fri 7:30am-6pm; Sat 8:00am-4:30pm   Callback Number: [phone]

## 2023-08-28 ENCOUNTER — Telehealth: Payer: Self-pay

## 2023-08-28 NOTE — Telephone Encounter (Signed)
 Copied from CRM [id number]. Topic: Clinical - Lab/Test Results >> Aug 24, 2023  9:28 AM Marda MATSU wrote: Pt Paul Hardy calling on the results of his MRI (not reviewed by provider yet)  Please advise

## 2023-08-29 ENCOUNTER — Ambulatory Visit (HOSPITAL_COMMUNITY)

## 2023-08-29 ENCOUNTER — Encounter (HOSPITAL_COMMUNITY)

## 2023-08-31 ENCOUNTER — Encounter (HOSPITAL_COMMUNITY)

## 2023-08-31 NOTE — Telephone Encounter (Signed)
 Pt was advised of results and recommendations in separate encounter 08/24/2023.

## 2023-09-02 NOTE — Telephone Encounter (Signed)

## 2023-09-06 ENCOUNTER — Other Ambulatory Visit: Payer: Self-pay | Admitting: Medical Genetics

## 2023-09-09 ENCOUNTER — Other Ambulatory Visit: Payer: Self-pay | Admitting: Family Medicine

## 2023-09-09 ENCOUNTER — Other Ambulatory Visit (HOSPITAL_COMMUNITY): Payer: Self-pay

## 2023-09-11 ENCOUNTER — Other Ambulatory Visit: Payer: Self-pay

## 2023-09-11 ENCOUNTER — Other Ambulatory Visit (HOSPITAL_COMMUNITY): Payer: Self-pay

## 2023-09-11 MED ORDER — DULOXETINE HCL 30 MG PO CPEP
30.0000 mg | ORAL_CAPSULE | Freq: Every day | ORAL | 0 refills | Status: DC
  Filled 2023-09-11 – 2023-12-04 (×2): qty 90, 90d supply, fill #0

## 2023-09-11 MED ORDER — METOPROLOL SUCCINATE ER 50 MG PO TB24
50.0000 mg | ORAL_TABLET | Freq: Every day | ORAL | 0 refills | Status: DC
  Filled 2023-09-11: qty 90, 90d supply, fill #0

## 2023-09-12 ENCOUNTER — Other Ambulatory Visit (HOSPITAL_COMMUNITY): Payer: Self-pay

## 2023-09-12 ENCOUNTER — Other Ambulatory Visit: Payer: Self-pay

## 2023-09-13 ENCOUNTER — Encounter: Payer: Self-pay | Admitting: Family Medicine

## 2023-09-13 ENCOUNTER — Other Ambulatory Visit: Payer: Self-pay

## 2023-09-13 ENCOUNTER — Ambulatory Visit (INDEPENDENT_AMBULATORY_CARE_PROVIDER_SITE_OTHER): Payer: 59 | Admitting: Family Medicine

## 2023-09-13 ENCOUNTER — Other Ambulatory Visit (HOSPITAL_COMMUNITY): Payer: Self-pay

## 2023-09-13 VITALS — BP 118/74 | HR 63 | Ht 73.0 in | Wt 227.0 lb

## 2023-09-13 DIAGNOSIS — E785 Hyperlipidemia, unspecified: Secondary | ICD-10-CM

## 2023-09-13 DIAGNOSIS — R5383 Other fatigue: Secondary | ICD-10-CM | POA: Insufficient documentation

## 2023-09-13 DIAGNOSIS — I10 Essential (primary) hypertension: Secondary | ICD-10-CM | POA: Diagnosis not present

## 2023-09-13 DIAGNOSIS — Z0000 Encounter for general adult medical examination without abnormal findings: Secondary | ICD-10-CM | POA: Diagnosis not present

## 2023-09-13 MED ORDER — FUROSEMIDE 40 MG PO TABS
80.0000 mg | ORAL_TABLET | ORAL | 3 refills | Status: AC | PRN
  Filled 2023-09-13: qty 60, 30d supply, fill #0
  Filled 2024-02-07: qty 60, 30d supply, fill #1
  Filled 2024-02-20: qty 60, 30d supply, fill #2

## 2023-09-13 NOTE — Progress Notes (Signed)
 Paul Hardy - 63 y.o. male MRN [id number]  Date of birth: 1960/03/12  Subjective Chief Complaint  Patient presents with   Annual Exam    HPI Paul Hardy is a 63 y.o. male here today for annual exam.   He reports that he is doing ok.  Dealing with some chronic fatigue and dyspnea.  He has been evaluated by cardiology and pulmonology.   He is moderately active.  Has been doing pulmonary rehab recently.  He feels that his diet is pretty good.    He is a former smoker.  Occasional EtOH use.   He does have regular dental care.   Review of Systems  Constitutional:  Negative for chills, fever, malaise/fatigue and weight loss.  HENT:  Negative for congestion, ear pain and sore throat.   Eyes:  Negative for blurred vision, double vision and pain.  Respiratory:  Negative for cough and shortness of breath.   Cardiovascular:  Negative for chest pain and palpitations.  Gastrointestinal:  Negative for abdominal pain, blood in stool, constipation, heartburn and nausea.  Genitourinary:  Negative for dysuria and urgency.  Musculoskeletal:  Negative for joint pain and myalgias.  Neurological:  Negative for dizziness and headaches.  Endo/Heme/Allergies:  Does not bruise/bleed easily.  Psychiatric/Behavioral:  Negative for depression. The patient is not nervous/anxious and does not have insomnia.     Allergies  Allergen Reactions   Bee Venom Anaphylaxis   Iodinated Contrast Media Anaphylaxis    Rash and Shortness of Breath   Hydrochlorothiazide Other (See Comments)    Dehydration and muscle spasms. Lasix  does the same   Lisinopril Other (See Comments) and Cough    Losartan  is OK    Past Medical History:  Diagnosis Date   CHF (congestive heart failure) (HCC)    Coronary artery disease    Hypertension     Past Surgical History:  Procedure Laterality Date   CARDIAC SURGERY     CHOLECYSTECTOMY, LAPAROSCOPIC     VASECTOMY      Social History   Socioeconomic History   Marital  status: Married    Spouse name: Not on file   Number of children: 2   Years of education: Not on file   Highest education level: Associate degree: occupational, Scientist, product/process development, or vocational program  Occupational History   Not on file  Tobacco Use   Smoking status: Former    Current packs/day: 0.00    Average packs/day: 1 pack/day for 22.8 years (22.8 ttl pk-yrs)    Types: Cigarettes    Start date: 02/21/1998    Quit date: 11/24/2020    Years since quitting: 2.8    Passive exposure: Never   Smokeless tobacco: Never  Vaping Use   Vaping status: Never Used  Substance and Sexual Activity   Alcohol use: Yes    Comment: social drinker   Drug use: Never   Sexual activity: Not on file  Other Topics Concern   Not on file  Social History Narrative   Lives with wife   Social Drivers of Health   Financial Resource Strain: Low Risk  (08/07/2023)   Overall Financial Resource Strain (CARDIA)    Difficulty of Paying Living Expenses: Not hard at all  Food Insecurity: No Food Insecurity (08/07/2023)   Hunger Vital Sign    Worried About Running Out of Food in the Last Year: Never true    Ran Out of Food in the Last Year: Never true  Transportation Needs: No Transportation Needs (08/07/2023)   PRAPARE -  Administrator, Civil Service (Medical): No    Lack of Transportation (Non-Medical): No  Physical Activity: Sufficiently Active (08/07/2023)   Exercise Vital Sign    Days of Exercise per Week: 5 days    Minutes of Exercise per Session: 30 min  Stress: No Stress Concern Present (08/07/2023)   [employer] of Occupational Health - Occupational Stress Questionnaire    Feeling of Stress: Only a little  Social Connections: Socially Integrated (08/07/2023)   Social Connection and Isolation Panel    Frequency of Communication with Friends and Family: Twice a week    Frequency of Social Gatherings with Friends and Family: Once a week    Attends Religious Services: 1 to 4 times per year     Active Member of Clubs or Organizations: Yes    Attends Engineer, structural: More than 4 times per year    Marital Status: Married    Family History  Problem Relation Age of Onset   Cancer Mother    Diabetes Father    Heart Problems Father     Health Maintenance  Topic Date Due   COVID-19 Vaccine (7 - Moderna risk 2024-25 season) 05/23/2023   INFLUENZA VACCINE  09/22/2023   Fecal DNA (Cologuard)  11/04/2023   Lung Cancer Screening  04/02/2024   DTaP/Tdap/Td (3 - Td or Tdap) 06/07/2029   Pneumococcal Vaccine 25-3 Years old  Completed   Hepatitis C Screening  Completed   HIV Screening  Completed   Zoster Vaccines- Shingrix  Completed   Hepatitis B Vaccines  Aged Out   HPV VACCINES  Aged Out   Meningococcal B Vaccine  Aged Out     ----------------------------------------------------------------------------------------------------------------------------------------------------------------------------------------------------------------- Physical Exam BP 118/74 (BP Location: Left Arm, Patient Position: Sitting, Cuff Size: Large)   Pulse 63   Ht 6' 1 (1.854 m)   Wt 227 lb (103 kg)   SpO2 95%   BMI 29.95 kg/m   Physical Exam Constitutional:      General: He is not in acute distress. HENT:     Head: Normocephalic and atraumatic.     Right Ear: Tympanic membrane and external ear normal.     Left Ear: Tympanic membrane and external ear normal.  Eyes:     General: No scleral icterus. Neck:     Thyroid: No thyromegaly.  Cardiovascular:     Rate and Rhythm: Normal rate and regular rhythm.     Heart sounds: Normal heart sounds.  Pulmonary:     Effort: Pulmonary effort is normal.     Breath sounds: Normal breath sounds.  Abdominal:     General: Bowel sounds are normal. There is no distension.     Palpations: Abdomen is soft.     Tenderness: There is no abdominal tenderness. There is no guarding.  Musculoskeletal:     Cervical back: Normal range of motion.   Lymphadenopathy:     Cervical: No cervical adenopathy.  Skin:    General: Skin is warm and dry.     Findings: No rash.  Neurological:     Mental Status: He is alert and oriented to person, place, and time.     Cranial Nerves: No cranial nerve deficit.     Motor: No abnormal muscle tone.  Psychiatric:        Mood and Affect: Mood normal.        Behavior: Behavior normal.     ------------------------------------------------------------------------------------------------------------------------------------------------------------------------------------------------------------------- Assessment and Plan  Well adult exam Well adult Orders Placed This Encounter  Procedures   CMP14+EGFR   CBC with Differential/Platelet   Lipid Panel With LDL/HDL Ratio   TSH   B12   Vitamin D  (25 hydroxy)   Iron, TIBC and Ferritin Panel  Screening: Cologuard, PSA Immunizations: UTD Anticipatory guidance/risk factor reduction: Recommendations per AVS.  Other fatigue Additional work up per lab orders.    Meds ordered this encounter  Medications   furosemide  (LASIX ) 40 MG tablet    Sig: Take 2 tablets (80 mg total) by mouth as needed for edema. 40 mg in the morning and 40mg  night    Dispense:  60 tablet    Refill:  3    No follow-ups on file.

## 2023-09-13 NOTE — Assessment & Plan Note (Signed)
 Additional work up per lab orders.

## 2023-09-13 NOTE — Patient Instructions (Signed)

## 2023-09-13 NOTE — Assessment & Plan Note (Signed)
 Well adult Orders Placed This Encounter  Procedures   CMP14+EGFR   CBC with Differential/Platelet   Lipid Panel With LDL/HDL Ratio   TSH   B12   Vitamin D  (25 hydroxy)   Iron, TIBC and Ferritin Panel  Screening: Cologuard, PSA Immunizations: UTD Anticipatory guidance/risk factor reduction: Recommendations per AVS.

## 2023-09-14 ENCOUNTER — Telehealth: Payer: Self-pay

## 2023-09-14 LAB — CBC WITH DIFFERENTIAL/PLATELET
Basophils Absolute: 0.1 x10E3/uL (ref 0.0–0.2)
Basos: 1 %
EOS (ABSOLUTE): 0.2 x10E3/uL (ref 0.0–0.4)
Eos: 3 %
Hematocrit: 44.2 % (ref 37.5–51.0)
Hemoglobin: 14.5 g/dL (ref 13.0–17.7)
Immature Grans (Abs): 0 x10E3/uL (ref 0.0–0.1)
Immature Granulocytes: 0 %
Lymphocytes Absolute: 2.9 x10E3/uL (ref 0.7–3.1)
Lymphs: 33 %
MCH: 30.5 pg (ref 26.6–33.0)
MCHC: 32.8 g/dL (ref 31.5–35.7)
MCV: 93 fL (ref 79–97)
Monocytes Absolute: 0.9 x10E3/uL (ref 0.1–0.9)
Monocytes: 10 %
Neutrophils Absolute: 4.7 x10E3/uL (ref 1.4–7.0)
Neutrophils: 53 %
Platelets: 196 x10E3/uL (ref 150–450)
RBC: 4.75 x10E6/uL (ref 4.14–5.80)
RDW: 12.3 % (ref 11.6–15.4)
WBC: 8.8 x10E3/uL (ref 3.4–10.8)

## 2023-09-14 LAB — CMP14+EGFR
ALT: 16 IU/L (ref 0–44)
AST: 16 IU/L (ref 0–40)
Albumin: 4 g/dL (ref 3.9–4.9)
Alkaline Phosphatase: 128 IU/L — ABNORMAL HIGH (ref 44–121)
BUN/Creatinine Ratio: 11 (ref 10–24)
BUN: 21 mg/dL (ref 8–27)
Bilirubin Total: 0.7 mg/dL (ref 0.0–1.2)
CO2: 21 mmol/L (ref 20–29)
Calcium: 9.5 mg/dL (ref 8.6–10.2)
Chloride: 101 mmol/L (ref 96–106)
Creatinine, Ser: 1.96 mg/dL — ABNORMAL HIGH (ref 0.76–1.27)
Globulin, Total: 2.1 g/dL (ref 1.5–4.5)
Glucose: 119 mg/dL — ABNORMAL HIGH (ref 70–99)
Potassium: 3.6 mmol/L (ref 3.5–5.2)
Sodium: 139 mmol/L (ref 134–144)
Total Protein: 6.1 g/dL (ref 6.0–8.5)
eGFR: 38 mL/min/1.73 — ABNORMAL LOW (ref >59)

## 2023-09-14 LAB — VITAMIN D 25 HYDROXY (VIT D DEFICIENCY, FRACTURES): Vit D, 25-Hydroxy: 20.6 ng/mL — ABNORMAL LOW (ref 30.0–100.0)

## 2023-09-14 LAB — LIPID PANEL WITH LDL/HDL RATIO
Cholesterol, Total: 120 mg/dL (ref 100–199)
HDL: 30 mg/dL — ABNORMAL LOW (ref >39)
LDL Chol Calc (NIH): 45 mg/dL (ref 0–99)
LDL/HDL Ratio: 1.5 ratio (ref 0.0–3.6)
Triglycerides: 297 mg/dL — ABNORMAL HIGH (ref 0–149)
VLDL Cholesterol Cal: 45 mg/dL — ABNORMAL HIGH (ref 5–40)

## 2023-09-14 LAB — TSH: TSH: 2.63 u[IU]/mL (ref 0.450–4.500)

## 2023-09-14 LAB — VITAMIN B12: Vitamin B-12: 321 pg/mL (ref 232–1245)

## 2023-09-14 NOTE — Telephone Encounter (Signed)
   Pre-operative Risk Assessment    Patient Name: Paul Hardy  DOB: 26-Dec-1960 MRN: [id number]   Date of last office visit: 07/04/2023 Date of next office visit: None   Request for Surgical Clearance    Procedure:  Colonoscopy  Date of Surgery:  Clearance 09/29/23                                 Surgeon:  Dr. Murry  Surgeon's Group or Practice Name:  [facility] Phone number:  [phone] Fax number:  [phone]   Type of Clearance Requested:   - Pharmacy:  Hold Clopidogrel  (Plavix ) last does 09/23/2023   Type of Anesthesia:  Not Indicated   Additional requests/questions:    Signed, Arah Aro M Brewster Wolters   09/14/2023, 4:22 PM--

## 2023-09-15 NOTE — Telephone Encounter (Signed)
 Follow Up:       Patient says he is retuning  a call from yesterday.

## 2023-09-15 NOTE — Telephone Encounter (Signed)
 Patient is requesting to speak with Lamarr Satterfield, NP. Please advise.

## 2023-09-15 NOTE — Telephone Encounter (Signed)
   Name: Paul Hardy  DOB: [birth year]  MRN: [id number]   Primary Cardiologist: None  Chart reviewed as part of pre-operative protocol coverage. Patient was contacted 09/15/2023 in reference to pre-operative risk assessment for pending surgery as outlined below.  Paul Hardy was last seen on 07/04/2023 by Dr. Bernie . Since that day, Paul Hardy has done ***.  Therefore, based on ACC/AHA guidelines, the patient would be at acceptable risk for the planned procedure without further cardiovascular testing.   The patient was advised that if he develops new symptoms prior to surgery to contact our office to arrange for a follow-up visit, and he verbalized understanding.  I will route this recommendation to the requesting party via Epic fax function and remove from pre-op pool. Please call with questions.  Paul Satterfield, NP 09/15/2023, 7:50 AM

## 2023-09-19 NOTE — Telephone Encounter (Signed)
 Delon called to check on status of clearance.

## 2023-09-19 NOTE — Telephone Encounter (Signed)
 Looks like clearance was faxed to requesting surgeons office. I will refax.

## 2023-09-20 NOTE — Telephone Encounter (Signed)
 Follow Up:      Patient's wife is returning a call from last week.

## 2023-09-20 NOTE — Telephone Encounter (Signed)
 I s/w the pt and he tells me that he was told to call our office about Plavix  hold for upcoming colonoscopy. In review of Plavix  I see that it is being managed by PCP DR. Velma Ku, who also just refilled Plavix  07/2023 for the pt. I informed the pt that PCP seems to be managing Plavix  and clearance will need to come from PCP. I informed the pt that I will forward this back to my preop APP for review and confirmation PCP managing Plavix . 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 Pt states he used to follow [employer] and then switched to our practice. Pt see Dr. Krasowski and has a recall in the system for a 6 month follow up in the fall 2025. I suggested to thew pt that he could d/w Dr. Krasowski to see if he possibly may take over managing Plavix .   I assured the pt that I will send this to preop APP for review and to the requesting office they will need to reach out to PCP for Plavix  clearance.   Pt thanked me for all of my help.

## 2023-09-21 ENCOUNTER — Ambulatory Visit: Payer: Self-pay | Admitting: Medical-Surgical

## 2023-09-22 ENCOUNTER — Other Ambulatory Visit (HOSPITAL_COMMUNITY): Payer: Self-pay

## 2023-09-22 DIAGNOSIS — D126 Benign neoplasm of colon, unspecified: Secondary | ICD-10-CM | POA: Insufficient documentation

## 2023-09-27 ENCOUNTER — Telehealth: Payer: Self-pay

## 2023-09-27 NOTE — Telephone Encounter (Signed)
 S/W pt and scheduled TELE Preop appt 09/28/23. Med Rec and Consent done.  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 Informed pt I would let the Preop APP know about the question.

## 2023-09-27 NOTE — Telephone Encounter (Signed)
 Med Rec and Consent done.    Patient Consent for Virtual Visit        Paul Hardy has provided verbal consent on 09/27/2023 for a virtual visit (video or telephone).   CONSENT FOR VIRTUAL VISIT FOR:  Paul Hardy  By participating in this virtual visit I agree to the following:  I hereby voluntarily request, consent and authorize [facility] and its employed or contracted physicians, physician assistants, nurse practitioners or other licensed health care professionals (the Practitioner), to provide me with telemedicine health care services (the "Services) as deemed necessary by the treating Practitioner. I acknowledge and consent to receive the Services by the Practitioner via telemedicine. I understand that the telemedicine visit will involve communicating with the Practitioner through live audiovisual communication technology and the disclosure of certain medical information by electronic transmission. I acknowledge that I have been given the opportunity to request an in-person assessment or other available alternative prior to the telemedicine visit and am voluntarily participating in the telemedicine visit.  I understand that I have the right to withhold or withdraw my consent to the use of telemedicine in the course of my care at any time, without affecting my right to future care or treatment, and that the Practitioner or I may terminate the telemedicine visit at any time. I understand that I have the right to inspect all information obtained and/or recorded in the course of the telemedicine visit and may receive copies of available information for a reasonable fee.  I understand that some of the potential risks of receiving the Services via telemedicine include:  Delay or interruption in medical evaluation due to technological equipment failure or disruption; Information transmitted may not be sufficient (e.g. poor resolution of images) to allow for appropriate medical decision  making by the Practitioner; and/or  In rare instances, security protocols could fail, causing a breach of personal health information.  Furthermore, I acknowledge that it is my responsibility to provide information about my medical history, conditions and care that is complete and accurate to the best of my ability. I acknowledge that Practitioner's advice, recommendations, and/or decision may be based on factors not within their control, such as incomplete or inaccurate data provided by me or distortions of diagnostic images or specimens that may result from electronic transmissions. I understand that the practice of medicine is not an exact science and that Practitioner makes no warranties or guarantees regarding treatment outcomes. I acknowledge that a copy of this consent can be made available to me via my patient portal [facility] MyChart), or I can request a printed copy by calling the office of [facility].    I understand that my insurance will be billed for this visit.   I have read or had this consent read to me. I understand the contents of this consent, which adequately explains the benefits and risks of the Services being provided via telemedicine.  I have been provided ample opportunity to ask questions regarding this consent and the Services and have had my questions answered to my satisfaction. I give my informed consent for the services to be provided through the use of telemedicine in my medical care

## 2023-09-27 NOTE — Telephone Encounter (Signed)
 Patient requires a VV ASAP. May need to be an add-on as procedure is on 09/29/2023. Check with Orren Fabry. PCP to manage holding Plavix  per notes above.

## 2023-09-28 ENCOUNTER — Ambulatory Visit: Attending: Cardiology

## 2023-09-28 DIAGNOSIS — I10 Essential (primary) hypertension: Secondary | ICD-10-CM

## 2023-09-28 NOTE — Progress Notes (Signed)
 Virtual Visit via Telephone Note   Because of Eydan Bramble co-morbid illnesses, he is at least at moderate risk for complications without adequate follow up.  This format is felt to be most appropriate for this patient at this time.  Due to technical limitations with video connection (technology), today's appointment will be conducted as an audio only telehealth visit, and Paul Hardy verbally agreed to proceed in this manner.   All issues noted in this document were discussed and addressed.  No physical exam could be performed with this format.  Evaluation Performed:  Preoperative cardiovascular risk assessment _____________   Date:  09/28/2023   Patient ID:  Paul Hardy, DOB [birth year], MRN [id number] Patient Location:  Home Provider location:   Office  Primary Care Provider:  Alvia Bring, DO Primary Cardiologist:  None  Chief Complaint / Patient Profile   63 y.o. y/o male with a h/o CHF, CAD, and HTN  who is pending colonoscopy and presents today for telephonic preoperative cardiovascular risk assessment.  History of Present Illness    Paul Hardy is a 63 y.o. male who presents via audio/video conferencing for a telehealth visit today.  Pt was last seen in cardiology clinic on 07/04/2023 by Dr. Bernie.  At that time Paul Hardy was doing well.  The patient is now pending procedure as outlined above. Since his last visit, he tells me that he has some shortness of breath but this is usual for him no chest pain.  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  We did discuss that his Plavix  hold will have to come from his primary care but from a cardiovascular standpoint since he is asymptomatic other than his usual shortness of breath he can hold Plavix  from a cardiovascular standpoint x 5 days prior to his procedure.  We would prefer him to continue his aspirin  throughout.    Plavix  clearance will need to come from PCP.    Past Medical History     Past Medical History:  Diagnosis Date   CHF (congestive heart failure) (HCC)    Coronary artery disease    Hypertension    Past Surgical History:  Procedure Laterality Date   CARDIAC SURGERY     CHOLECYSTECTOMY, LAPAROSCOPIC     VASECTOMY      Allergies  Allergies  Allergen Reactions   Bee Venom Anaphylaxis   Iodinated Contrast Media Anaphylaxis    Rash and Shortness of Breath   Hydrochlorothiazide Other (See Comments)    Dehydration and muscle spasms. Lasix  does the same   Lisinopril Other (See Comments) and Cough    Losartan  is OK    Home Medications    Prior to Admission medications   Medication Sig Start Date End Date Taking? Authorizing Provider  albuterol  (VENTOLIN  HFA) 108 (90 Base) MCG/ACT inhaler INHALE 2 PUFFS INTO THE LUNGS EVERY 6 HOURS AS NEEDED FOR WHEEZING Patient taking differently: Inhale 2 puffs into the lungs every 6 (six) hours as needed for wheezing or shortness of breath. 03/17/22   Hope Almarie ORN, NP  alfuzosin  (UROXATRAL ) 10 MG 24 hr tablet Take 1 tablet (10 mg total) by mouth daily with breakfast. 06/16/23   Stoneking, Adine PARAS., MD  aspirin  81 MG chewable tablet Chew 81 mg by mouth daily.    [provider]  atorvastatin  (LIPITOR) 80 MG tablet Take 1 tablet (80 mg total) by mouth at bedtime. 09/13/22   Alvia Bring, DO  budesonide -glycopyrrolate -formoterol  (BREZTRI  AEROSPHERE) 160-9-4.8 MCG/ACT AERO  inhaler Inhale 2 puffs into the lungs 2 (two) times daily. 07/07/23   Alvia Bring, DO  clopidogrel  (PLAVIX ) 75 MG tablet Take 1 tablet (75 mg total) by mouth daily. 08/18/23   Alvia Bring, DO  DULoxetine  (CYMBALTA ) 30 MG capsule Take 1 capsule (30 mg total) by mouth daily. 09/11/23   Alvia Bring, DO  EPINEPHrine  0.3 mg/0.3 mL IJ SOAJ injection Inject 0.3 mg into the muscle as needed for anaphylaxis.    [provider]  furosemide  (LASIX ) 40 MG tablet Take 2 tablets (80 mg total) by mouth as needed for edema. 40 mg in the  morning and 40mg  night 09/13/23   Alvia Bring, DO  gabapentin  (NEURONTIN ) 300 MG capsule Take 1 capsule (300 mg total) by mouth 2 (two) times daily for 7 days, THEN 1 capsule (300 mg total) 3 (three) times daily. 08/10/23 10/17/23  Curtis Debby PARAS, MD  KLOR-CON  M20 20 MEQ tablet Take 20 mEq by mouth as needed (take with lasix ). 12/02/20   [provider]  metoprolol  succinate (TOPROL -XL) 50 MG 24 hr tablet Take 1 tablet (50 mg total) by mouth daily. 09/11/23   Alvia Bring, DO  Multiple Vitamin (MULTIVITAMIN) tablet Take 1 tablet by mouth daily.    [provider]  nitroGLYCERIN  (NITROSTAT ) 0.4 MG SL tablet Place 1 tablet (0.4 mg total) under the tongue every 5 (five) minutes as needed for chest pain. 07/07/23   Alvia Bring, DO  omeprazole (PRILOSEC) 40 MG capsule Take 40 mg by mouth daily. 06/25/21   [provider]  ranolazine  (RANEXA ) 500 MG 12 hr tablet Take 500 mg by mouth 2 (two) times daily.    [provider]  SYMBICORT  160-4.5 MCG/ACT inhaler INHALE 2 PUFFS INTO THE LUNGS IN THE MORNING AND AT BEDTIME 03/15/22   Alvia Bring, DO  tadalafil (CIALIS) 20 MG tablet Take 20 mg by mouth as needed for erectile dysfunction. 03/07/22   [provider]    Physical Exam    Vital Signs:  Paul Hardy does not have vital signs available for review today.  Given telephonic nature of communication, physical exam is limited. AAOx3. NAD. Normal affect.  Speech and respirations are unlabored.  Accessory Clinical Findings    None  Assessment & Plan    1.  Preoperative Cardiovascular Risk Assessment:  Paul Hardy perioperative risk of a major cardiac event is 0.4% according to the Revised Cardiac Risk Index (RCRI).  Therefore, he is at low risk for perioperative complications.   His functional capacity is good at 6.36 METs according to the Duke Activity Status Index (DASI). Recommendations: According to ACC/AHA guidelines, no further  cardiovascular testing needed.  The patient may proceed to surgery at acceptable risk.   Antiplatelet and/or Anticoagulation Recommendations: The patient should remain on Aspirin  without interruption.    The patient was advised that if he develops new symptoms prior to surgery to contact our office to arrange for a follow-up visit, and he verbalized understanding.   A copy of this note will be routed to requesting surgeon.  Time:   Today, I have spent 7 minutes with the patient with telehealth technology discussing medical history, symptoms, and management plan.     Paul LOISE Fabry, PA-C  09/28/2023, 10:39 AM

## 2023-09-29 DIAGNOSIS — Z1211 Encounter for screening for malignant neoplasm of colon: Secondary | ICD-10-CM | POA: Diagnosis not present

## 2023-09-29 DIAGNOSIS — D122 Benign neoplasm of ascending colon: Secondary | ICD-10-CM | POA: Diagnosis not present

## 2023-09-29 DIAGNOSIS — D123 Benign neoplasm of transverse colon: Secondary | ICD-10-CM | POA: Diagnosis not present

## 2023-09-29 LAB — HM COLONOSCOPY

## 2023-09-30 ENCOUNTER — Other Ambulatory Visit (HOSPITAL_COMMUNITY): Payer: Self-pay

## 2023-10-02 ENCOUNTER — Other Ambulatory Visit: Payer: Self-pay

## 2023-10-02 ENCOUNTER — Other Ambulatory Visit (HOSPITAL_COMMUNITY): Payer: Self-pay

## 2023-10-07 ENCOUNTER — Other Ambulatory Visit (HOSPITAL_COMMUNITY): Payer: Self-pay

## 2023-10-12 ENCOUNTER — Encounter: Payer: Self-pay | Admitting: Family Medicine

## 2023-10-24 ENCOUNTER — Encounter: Payer: Self-pay | Admitting: Sports Medicine

## 2023-10-24 ENCOUNTER — Other Ambulatory Visit (HOSPITAL_COMMUNITY)
Admission: RE | Admit: 2023-10-24 | Discharge: 2023-10-24 | Disposition: A | Discharge status: Home / Self Care | Payer: Self-pay | Source: Ambulatory Visit | Attending: Oncology | Admitting: Oncology

## 2023-10-25 ENCOUNTER — Other Ambulatory Visit: Payer: Self-pay

## 2023-10-25 ENCOUNTER — Ambulatory Visit
Admission: EM | Admit: 2023-10-25 | Discharge: 2023-10-25 | Disposition: A | Discharge status: Home / Self Care | Attending: Family Medicine | Admitting: Family Medicine

## 2023-10-25 DIAGNOSIS — J069 Acute upper respiratory infection, unspecified: Secondary | ICD-10-CM

## 2023-10-25 DIAGNOSIS — J441 Chronic obstructive pulmonary disease with (acute) exacerbation: Secondary | ICD-10-CM | POA: Diagnosis not present

## 2023-10-25 DIAGNOSIS — R0602 Shortness of breath: Secondary | ICD-10-CM

## 2023-10-25 LAB — POC SARS CORONAVIRUS 2 AG -  ED: SARS Coronavirus 2 Ag: NEGATIVE

## 2023-10-25 LAB — POC INFLUENZA A AND B ANTIGEN (URGENT CARE ONLY)
Influenza A Ag: NEGATIVE
Influenza B Ag: NEGATIVE

## 2023-10-25 MED ORDER — DOXYCYCLINE HYCLATE 100 MG PO CAPS: 100.0000 mg | ORAL_CAPSULE | Freq: Two times a day (BID) | ORAL | 0 refills | Status: DC

## 2023-10-25 MED ORDER — PREDNISONE 20 MG PO TABS: 40.0000 mg | ORAL_TABLET | Freq: Every day | ORAL | 0 refills | Status: DC

## 2023-10-25 NOTE — ED Provider Notes (Signed)
 Paul Hardy CARE    CSN: [id number] Arrival date & time: 10/25/23  1754      History   Chief Complaint Chief Complaint  Patient presents with   Cough   Otalgia   Generalized Body Aches    HPI Paul Hardy is a 63 y.o. male.   Patient is here for some coughing and chest congestion.  Headache and body ache.  Sore throat.  He works as a Radiation protection practitioner.  Exposed to a lot of illness.  In addition he used to be a smoker known coronary artery disease, known COPD.  He is here for testing and because he feels like when he gets a respiratory infection he does better on antibiotics.  No sweats chills or fever.    Past Medical History:  Diagnosis Date   CHF (congestive heart failure) (HCC)    Coronary artery disease    Hypertension     Patient Active Problem List   Diagnosis Date Noted   Other fatigue 09/13/2023   Lumbar spondylosis 06/29/2023   Hypokalemia 04/22/2023   Enteritis 04/22/2023   Ileus (HCC) 04/22/2023   SBO (small bowel obstruction) (HCC) 04/21/2023   COPD (chronic obstructive pulmonary disease) (HCC) 03/16/2023   Reactive airway disease 04/28/2022   COVID 12/09/2021   Raynaud's disease without gangrene 09/28/2021   Syncopal episodes 08/26/2021   COPD exacerbation (HCC) 07/29/2021   Dyslipidemia 02/03/2021   Ischemic cardiomyopathy 01/18/2021   SOB (shortness of breath) 01/04/2021   Adjustment disorder 12/06/2020   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 Authorizing Provider  doxycycline  (VIBRAMYCIN ) 100 MG capsule Take 1 capsule (100 mg total) by mouth 2 (two) times daily. 10/25/23  Yes Maranda Jamee Jacob, MD  predniSONE  (DELTASONE ) 20 MG tablet Take 2 tablets (40 mg total) by mouth daily with breakfast. 10/25/23  Yes Maranda Jamee Jacob, MD  albuterol  (VENTOLIN  HFA) 108 (90 Base) MCG/ACT inhaler INHALE 2 PUFFS INTO THE LUNGS EVERY 6 HOURS AS NEEDED FOR WHEEZING Patient taking differently: Inhale 2 puffs into the lungs every 6 (six) hours as needed for wheezing or shortness of breath. 03/17/22   Hope Almarie ORN, NP  alfuzosin  (UROXATRAL ) 10 MG 24 hr tablet Take 1 tablet (10 mg total) by mouth daily with breakfast. 06/16/23   Stoneking, Adine PARAS., MD  aspirin  81 MG chewable tablet Chew 81 mg by mouth daily.    [provider]  atorvastatin  (LIPITOR) 80 MG tablet Take 1 tablet (80 mg total) by mouth at bedtime. 09/13/22   Alvia Bring, DO  budesonide -glycopyrrolate -formoterol  (BREZTRI  AEROSPHERE) 160-9-4.8 MCG/ACT AERO inhaler Inhale 2 puffs into the lungs 2 (two) times daily. 07/07/23   Alvia Bring, DO  clopidogrel  (PLAVIX ) 75 MG tablet Take 1 tablet (75 mg total) by mouth daily. 08/18/23   Alvia Bring, DO  DULoxetine  (CYMBALTA ) 30 MG capsule Take 1 capsule (30 mg total) by mouth daily. 09/11/23   Alvia Bring, DO  EPINEPHrine   0.3 mg/0.3 mL IJ SOAJ injection Inject 0.3 mg into the muscle as needed for anaphylaxis.    [provider]  furosemide  (LASIX ) 40 MG tablet Take 2 tablets (80 mg total) by mouth as needed for edema. 40 mg in the morning and 40mg  night 09/13/23   Alvia Bring, DO  gabapentin  (NEURONTIN ) 300 MG capsule Take 1 capsule (300 mg total) by mouth 2 (two) times daily for 7 days, THEN 1 capsule (300 mg total) 3 (three) times daily. 08/10/23 11/24/23  Curtis Debby PARAS, MD  KLOR-CON  M20 20 MEQ tablet Take 20 mEq by mouth as needed (take with lasix ). 12/02/20   [provider]  metoprolol  succinate (TOPROL -XL) 50 MG 24 hr tablet Take 1 tablet (50 mg total) by mouth daily. 09/11/23   Alvia Bring, DO  Multiple Vitamin (MULTIVITAMIN) tablet Take 1 tablet by mouth daily.    [provider]  nitroGLYCERIN  (NITROSTAT ) 0.4 MG SL tablet Place 1 tablet (0.4 mg total) under the tongue every 5 (five) minutes as needed for chest pain. 07/07/23   Alvia Bring, DO  omeprazole (PRILOSEC) 40 MG capsule Take 40 mg by mouth daily. 06/25/21   [provider]  ranolazine  (RANEXA ) 500 MG 12 hr tablet Take 500 mg by mouth 2 (two) times daily.    [provider]  SYMBICORT  160-4.5 MCG/ACT inhaler INHALE 2 PUFFS INTO THE LUNGS IN THE MORNING AND AT BEDTIME 03/15/22   Alvia Bring, DO  tadalafil (CIALIS) 20 MG tablet Take 20 mg by mouth as needed for erectile dysfunction. 03/07/22   [provider]    Family History Family History  Problem Relation Age of Onset   Cancer Mother    Diabetes Father    Heart Problems Father     Social History Social History   Tobacco Use   Smoking status: Former    Current packs/day: 0.00    Average packs/day: 1 pack/day for 22.8 years (22.8 ttl pk-yrs)    Types: Cigarettes    Start date: 02/21/1998    Quit date: 11/24/2020    Years since quitting: 2.9    Passive exposure: Never   Smokeless tobacco: Never  Vaping Use   Vaping status: Never Used  Substance Use Topics   Alcohol use: Yes    Comment: social drinker   Drug use: Never     Allergies   Bee venom, Iodinated contrast media, Hydrochlorothiazide, and Lisinopril   Review of Systems Review of Systems See HPI  Physical Exam Triage Vital Signs ED Triage Vitals 10/25/23 1803  Encounter Vitals Group     BP (!) 140/80     Girls Systolic BP Percentile      Girls Diastolic BP Percentile      Boys Systolic BP Percentile      Boys Diastolic BP Percentile      Pulse Rate 68     Resp 17     Temp 97.8 F (36.6 C)     Temp Source Oral      SpO2 95 %     Weight      Height      Head Circumference      Peak Flow      Pain Score 1     Pain Loc      Pain Education      Exclude from Growth Chart    No data found.  Updated Vital Signs BP (!) 140/80 (BP Location: Right Arm)   Pulse 68   Temp  97.8 F (36.6 C) (Oral)   Resp 17   SpO2 95%       Physical Exam Constitutional:      General: He is not in acute distress.    Appearance: He is well-developed.  HENT:     Head: Normocephalic and atraumatic.     Right Ear: Tympanic membrane normal.     Left Ear: Tympanic membrane normal.     Nose: Nose normal. No rhinorrhea.     Mouth/Throat:     Pharynx: Posterior oropharyngeal erythema present.  Eyes:     Conjunctiva/sclera: Conjunctivae normal.     Pupils: Pupils are equal, round, and reactive to light.  Cardiovascular:     Rate and Rhythm: Normal rate and regular rhythm.     Heart sounds: Normal heart sounds.  Pulmonary:     Effort: Pulmonary effort is normal. No respiratory distress.     Breath sounds: Rhonchi present.  Abdominal:     General: There is no distension.     Palpations: Abdomen is soft.  Musculoskeletal:        General: Normal range of motion.     Cervical back: Normal range of motion.  Lymphadenopathy:     Cervical: No cervical adenopathy.  Skin:    General: Skin is warm and dry.  Neurological:     Mental Status: He is alert.      UC Treatments / Results  Labs (all labs ordered are listed, but only abnormal results are displayed) Labs Reviewed  POC SARS CORONAVIRUS 2 AG -  ED  POC INFLUENZA A AND B ANTIGEN (URGENT CARE ONLY)    EKG   Radiology No results found.  Procedures Procedures (including critical care time)  Medications Ordered in UC Medications - No data to display  Initial Impression / Assessment and Plan / UC Course  I have reviewed the triage vital signs and the nursing notes.  Pertinent labs & imaging results that were available during my care of the  patient were reviewed by me and considered in my medical decision making (see chart for details).     COVID and flu testing are negative  Final Clinical Impressions(s) / UC Diagnoses   Final diagnoses:  SOB (shortness of breath)  COPD exacerbation (HCC)  Acute upper respiratory infection     Discharge Instructions      Continue Symbicort  daily and albuterol  as needed Drink lots of fluids May use over-the-counter cough or cold medicines as needed I am prescribing an antibiotic because of your history of respiratory infections Prednisone  will help with the wheezing and shortness of breath     ED Prescriptions     Medication Sig Dispense Auth. Provider   doxycycline  (VIBRAMYCIN ) 100 MG capsule Take 1 capsule (100 mg total) by mouth 2 (two) times daily. 20 capsule Maranda Jamee Jacob, MD   predniSONE  (DELTASONE ) 20 MG tablet Take 2 tablets (40 mg total) by mouth daily with breakfast. 10 tablet Maranda Jamee Jacob, MD      PDMP not reviewed this encounter.   Maranda Jamee Jacob, MD 10/25/23 [phone]

## 2023-10-25 NOTE — ED Triage Notes (Addendum)
 Pt presents to UC w wife c/o dry cough, bodyaches and ear fullness since yesterday. Halls cough drops and tylenol  prn.

## 2023-10-25 NOTE — Discharge Instructions (Addendum)
 Continue Symbicort  daily and albuterol  as needed Drink lots of fluids May use over-the-counter cough or cold medicines as needed I am prescribing an antibiotic because of your history of respiratory infections Prednisone  will help with the wheezing and shortness of breath

## 2023-10-30 ENCOUNTER — Ambulatory Visit (INDEPENDENT_AMBULATORY_CARE_PROVIDER_SITE_OTHER): Admitting: Urology

## 2023-10-30 ENCOUNTER — Encounter: Payer: Self-pay | Admitting: Urology

## 2023-10-30 ENCOUNTER — Other Ambulatory Visit: Payer: Self-pay

## 2023-10-30 ENCOUNTER — Other Ambulatory Visit (HOSPITAL_COMMUNITY): Payer: Self-pay

## 2023-10-30 VITALS — BP 143/90 | HR 66 | Ht 73.0 in | Wt 200.0 lb

## 2023-10-30 DIAGNOSIS — N401 Enlarged prostate with lower urinary tract symptoms: Secondary | ICD-10-CM | POA: Diagnosis not present

## 2023-10-30 DIAGNOSIS — N529 Male erectile dysfunction, unspecified: Secondary | ICD-10-CM

## 2023-10-30 DIAGNOSIS — R3129 Other microscopic hematuria: Secondary | ICD-10-CM

## 2023-10-30 DIAGNOSIS — N138 Other obstructive and reflux uropathy: Secondary | ICD-10-CM | POA: Insufficient documentation

## 2023-10-30 DIAGNOSIS — N281 Cyst of kidney, acquired: Secondary | ICD-10-CM | POA: Diagnosis not present

## 2023-10-30 LAB — URINALYSIS, ROUTINE W REFLEX MICROSCOPIC
Bilirubin, UA: NEGATIVE
Glucose, UA: NEGATIVE
Ketones, UA: NEGATIVE
Leukocytes,UA: NEGATIVE
Nitrite, UA: NEGATIVE
Protein,UA: NEGATIVE
Specific Gravity, UA: 1.02 (ref 1.005–1.030)
Urobilinogen, Ur: 2 mg/dL — ABNORMAL HIGH (ref 0.2–1.0)
pH, UA: 5.5 (ref 5.0–7.5)

## 2023-10-30 LAB — GENECONNECT MOLECULAR SCREEN: Genetic Analysis Overall Interpretation: NEGATIVE

## 2023-10-30 LAB — MICROSCOPIC EXAMINATION

## 2023-10-30 MED ORDER — TADALAFIL 20 MG PO TABS
20.0000 mg | ORAL_TABLET | ORAL | 11 refills | Status: AC | PRN
  Filled 2023-10-30: qty 10, 10d supply, fill #0
  Filled 2024-02-07: qty 10, 10d supply, fill #1
  Filled 2024-02-20: qty 10, 10d supply, fill #2

## 2023-10-30 MED ORDER — ALFUZOSIN HCL ER 10 MG PO TB24
10.0000 mg | ORAL_TABLET | Freq: Every day | ORAL | 3 refills | Status: AC
  Filled 2023-10-30 – 2023-11-06 (×2): qty 90, 90d supply, fill #0
  Filled 2024-02-04: qty 90, 90d supply, fill #1
  Filled 2024-02-20 – 2024-03-13 (×2): qty 90, 90d supply, fill #2

## 2023-10-30 NOTE — Progress Notes (Signed)
 Assessment: 1. BPH with obstruction/lower urinary tract symptoms   2. 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 Microscopic hematuria   4. Organic impotence     Plan: Continue alfuzosin  10 mg daily.  Refill sent. Continue tadalafil  prn.  Refill sent. Return to office in 3-4 weeks for repeat U/A  Chief Complaint: Chief Complaint  Patient presents with   Benign Prostatic Hypertrophy    HPI: Paul Hardy is a 63 y.o. male who presents for continued evaluation of a right renal cyst and BPH with LUTS. He was found to have a small minimally complex right renal cyst. 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 Patient has pmhx of CAD s/p CABG, hypertension, dyslipidemia, obstructive sleep apnea, cannot tolerate CPAP mask.     Patient also has stage III CKD with recent creatinine 1.62/GFR = 48   Patient was found to have a 2.1 cm minimally complex right renal cystic lesion classified as Bosniak 51F in October 2023.  Also noted to have a 10mm left nonobstructing renal calculus.  He saw a urologist at that time at [facility] who recommended ongoing follow-up given its classification.  MRI abdomen with and without contrast from 2/24 showed small Bosniak category 1 and 2 cyst in both kidneys.  No additional follow-up for the cyst was recommended. CT abdomen and pelvis without contrast from 2/25 for evaluation abdominal pain showed a 10 mm left renal calculus without obstruction and stable bilateral renal cysts.  He has a history of BPH with lower urinary tract symptoms and had been managed with tamsulosin . PSA from 6/24: 0.96  At his visit in April 2025, he continued on tamsulosin .  He reported some increase in his urinary symptoms.  His lower urinary tract symptoms remained intermittent in nature.  He noted frequency after taking his Lasix  in the morning.  He reported difficulty with a slow stream.  No dysuria or gross hematuria. IPSS = 13/3. PVR = 39 mL  He was changed to alfuzosin  in April 2025. At his  visit in June 2025, he continued on alfuzosin .  He had not seen a significant improvement in his lower urinary tract symptoms with the medication.  Overall, his lower urinary tract symptoms remain stable.  No dysuria or gross hematuria.  No side effects from the medication. IPSS = 14/3. He continued on alfuzosin . PSA 6/25: 1.3  He returns today for follow-up.  He continues on alfuzosin  10 mg daily.  He has symptoms of decreased stream.  No dysuria or gross hematuria.  He feels like his urinary symptoms are stable. IPSS = 13/3. He continues to use tadalafil  for ED with satisfactory results.  Portions of the above documentation were copied from a prior visit for review purposes only.  Allergies: Allergies  Allergen Reactions   Bee Venom Anaphylaxis   Iodinated Contrast Media Anaphylaxis    Rash and Shortness of Breath   Hydrochlorothiazide Other (See Comments)    Dehydration and muscle spasms. Lasix  does the same   Lisinopril Other (See Comments) and Cough    Losartan  is OK    PMH: Past Medical History:  Diagnosis Date   CHF (congestive heart failure) (HCC)    Coronary artery disease    Hypertension     PSH: Past Surgical History:  Procedure Laterality Date   CARDIAC SURGERY     CHOLECYSTECTOMY, LAPAROSCOPIC     VASECTOMY      SH: Social History   Tobacco Use   Smoking status: Former  Current packs/day: 0.00    Average packs/day: 1 pack/day for 22.8 years (22.8 ttl pk-yrs)    Types: Cigarettes    Start date: 02/21/1998    Quit date: 11/24/2020    Years since quitting: 2.9    Passive exposure: Never   Smokeless tobacco: Never  Vaping Use   Vaping status: Never Used  Substance Use Topics   Alcohol use: Yes    Comment: social drinker   Drug use: Never    ROS: Constitutional:  Negative for fever, chills, weight loss CV: Negative for chest pain, previous MI, hypertension Respiratory:  Negative for shortness of breath, wheezing, sleep apnea, frequent cough GI:   Negative for nausea, vomiting, bloody stool, GERD  PE: BP (!) 143/90   Pulse 66   Ht 6' 1 (1.854 m)   Wt 200 lb (90.7 kg)   BMI 26.39 kg/m  GENERAL APPEARANCE:  Well appearing, well developed, well nourished, NAD HEENT:  Atraumatic, normocephalic, oropharynx clear NECK:  Supple without lymphadenopathy or thyromegaly ABDOMEN:  Soft, non-tender, no masses EXTREMITIES:  Moves all extremities well, without clubbing, cyanosis, or edema NEUROLOGIC:  Alert and oriented x 3, normal gait, CN II-XII grossly intact MENTAL STATUS:  appropriate BACK:  Non-tender to palpation, No CVAT SKIN:  Warm, dry, and intact  Results: U/A: 3-10 RBCs, 0-5 WBCs

## 2023-11-02 DIAGNOSIS — R42 Dizziness and giddiness: Secondary | ICD-10-CM | POA: Diagnosis not present

## 2023-11-02 DIAGNOSIS — Z8709 Personal history of other diseases of the respiratory system: Secondary | ICD-10-CM | POA: Insufficient documentation

## 2023-11-02 DIAGNOSIS — I509 Heart failure, unspecified: Secondary | ICD-10-CM | POA: Diagnosis not present

## 2023-11-02 DIAGNOSIS — Z8679 Personal history of other diseases of the circulatory system: Secondary | ICD-10-CM | POA: Insufficient documentation

## 2023-11-02 DIAGNOSIS — R61 Generalized hyperhidrosis: Secondary | ICD-10-CM | POA: Diagnosis not present

## 2023-11-02 DIAGNOSIS — J9811 Atelectasis: Secondary | ICD-10-CM | POA: Diagnosis not present

## 2023-11-02 DIAGNOSIS — N400 Enlarged prostate without lower urinary tract symptoms: Secondary | ICD-10-CM | POA: Diagnosis not present

## 2023-11-02 DIAGNOSIS — R001 Bradycardia, unspecified: Secondary | ICD-10-CM | POA: Diagnosis not present

## 2023-11-02 DIAGNOSIS — J449 Chronic obstructive pulmonary disease, unspecified: Secondary | ICD-10-CM | POA: Diagnosis not present

## 2023-11-02 DIAGNOSIS — Z955 Presence of coronary angioplasty implant and graft: Secondary | ICD-10-CM | POA: Diagnosis not present

## 2023-11-02 DIAGNOSIS — E876 Hypokalemia: Secondary | ICD-10-CM | POA: Diagnosis not present

## 2023-11-02 DIAGNOSIS — N1831 Chronic kidney disease, stage 3a: Secondary | ICD-10-CM | POA: Diagnosis not present

## 2023-11-02 DIAGNOSIS — I2510 Atherosclerotic heart disease of native coronary artery without angina pectoris: Secondary | ICD-10-CM | POA: Diagnosis not present

## 2023-11-02 DIAGNOSIS — R55 Syncope and collapse: Secondary | ICD-10-CM | POA: Diagnosis not present

## 2023-11-02 DIAGNOSIS — R7303 Prediabetes: Secondary | ICD-10-CM | POA: Insufficient documentation

## 2023-11-02 DIAGNOSIS — G319 Degenerative disease of nervous system, unspecified: Secondary | ICD-10-CM | POA: Diagnosis not present

## 2023-11-02 LAB — HEMOGLOBIN A1C: Hemoglobin A1C: 7.4

## 2023-11-03 DIAGNOSIS — I2510 Atherosclerotic heart disease of native coronary artery without angina pectoris: Secondary | ICD-10-CM | POA: Diagnosis not present

## 2023-11-03 DIAGNOSIS — F419 Anxiety disorder, unspecified: Secondary | ICD-10-CM | POA: Diagnosis not present

## 2023-11-03 DIAGNOSIS — G319 Degenerative disease of nervous system, unspecified: Secondary | ICD-10-CM | POA: Diagnosis not present

## 2023-11-03 DIAGNOSIS — Z955 Presence of coronary angioplasty implant and graft: Secondary | ICD-10-CM | POA: Diagnosis not present

## 2023-11-03 DIAGNOSIS — R55 Syncope and collapse: Secondary | ICD-10-CM | POA: Diagnosis not present

## 2023-11-03 DIAGNOSIS — J449 Chronic obstructive pulmonary disease, unspecified: Secondary | ICD-10-CM | POA: Diagnosis not present

## 2023-11-03 DIAGNOSIS — F32A Depression, unspecified: Secondary | ICD-10-CM | POA: Diagnosis not present

## 2023-11-03 DIAGNOSIS — R569 Unspecified convulsions: Secondary | ICD-10-CM | POA: Diagnosis not present

## 2023-11-04 DIAGNOSIS — Z955 Presence of coronary angioplasty implant and graft: Secondary | ICD-10-CM | POA: Diagnosis not present

## 2023-11-04 DIAGNOSIS — I2510 Atherosclerotic heart disease of native coronary artery without angina pectoris: Secondary | ICD-10-CM | POA: Diagnosis not present

## 2023-11-04 DIAGNOSIS — R55 Syncope and collapse: Secondary | ICD-10-CM | POA: Diagnosis not present

## 2023-11-04 DIAGNOSIS — R001 Bradycardia, unspecified: Secondary | ICD-10-CM | POA: Insufficient documentation

## 2023-11-04 DIAGNOSIS — J449 Chronic obstructive pulmonary disease, unspecified: Secondary | ICD-10-CM | POA: Diagnosis not present

## 2023-11-06 ENCOUNTER — Other Ambulatory Visit: Payer: Self-pay

## 2023-11-06 ENCOUNTER — Telehealth: Payer: Self-pay

## 2023-11-06 ENCOUNTER — Other Ambulatory Visit (HOSPITAL_COMMUNITY): Payer: Self-pay

## 2023-11-06 ENCOUNTER — Encounter: Payer: Self-pay | Admitting: Family Medicine

## 2023-11-06 DIAGNOSIS — E1165 Type 2 diabetes mellitus with hyperglycemia: Secondary | ICD-10-CM

## 2023-11-06 MED ORDER — ONETOUCH DELICA PLUS LANCET30G MISC
12 refills | Status: AC
  Filled 2023-11-06: qty 100, 50d supply, fill #0
  Filled 2023-12-25: qty 100, 50d supply, fill #1
  Filled 2024-02-20: qty 100, 50d supply, fill #2

## 2023-11-06 MED ORDER — ONETOUCH ULTRA TEST VI STRP
ORAL_STRIP | 12 refills | Status: AC
  Filled 2023-11-06: qty 100, 50d supply, fill #0
  Filled 2023-12-25: qty 100, 50d supply, fill #1
  Filled 2024-02-20: qty 100, 50d supply, fill #2

## 2023-11-06 MED ORDER — ONETOUCH ULTRA 2 W/DEVICE KIT
PACK | 0 refills | Status: AC
  Filled 2023-11-06: qty 1, 1d supply, fill #0

## 2023-11-06 NOTE — Transitions of Care (Post Inpatient/ED Visit) (Signed)
 11/06/2023  Name: Paul Hardy MRN: [id number] DOB: 1960-05-14  Today's TOC FU Call Status: Today's TOC FU Call Status:: Successful TOC FU Call Completed TOC FU Call Complete Date: 11/06/23 Patient's Name and Date of Birth confirmed.  Transition Care Management Follow-up Telephone Call Date of Discharge: 11/04/23 Discharge Facility: Other [facility]) Name of Other (Non[facility]) Discharge Facility: [facility] Type of Discharge: Inpatient Admission Primary Inpatient Discharge Diagnosis:: syncope How have you been since you were released from the hospital?: Better Any questions or concerns?: Yes Patient Questions/Concerns:: hospital wants him to start metformin, patient not started wants to talk to Dr. alvia first  Items Reviewed: Did you receive and understand the discharge instructions provided?: Yes Medications obtained,verified, and reconciled?: Yes (Medications Reviewed) Any new allergies since your discharge?: No Dietary orders reviewed?: Yes Do you have support at home?: Yes People in Home [RPT]: spouse  Medications Reviewed Today: Medications Reviewed Today     Reviewed by Emmitt Pan, LPN (Licensed Practical Nurse) on 11/06/23 at 1435  Med List Status: <None>   Medication Order Taking? Sig Documenting Provider Last Dose Status Informant  albuterol  (VENTOLIN  HFA) 108 (90 Base) MCG/ACT inhaler [id number] Yes INHALE 2 PUFFS INTO THE LUNGS EVERY 6 HOURS AS NEEDED FOR WHEEZING  Patient taking differently: Inhale 2 puffs into the lungs every 6 (six) hours as needed for wheezing or shortness of breath.   Hope Almarie ORN, NP  Active Self, Pharmacy Records  alfuzosin  (UROXATRAL ) 10 MG 24 hr tablet [id number] Yes Take 1 tablet (10 mg total) by mouth daily with breakfast. Roseann Adine PARAS., MD  Active   aspirin  81 MG chewable tablet [id number] Yes Chew 81 mg by mouth daily. [provider]  Active Self, Pharmacy Records  atorvastatin  (LIPITOR) 80 MG tablet  [id number] Yes Take 1 tablet (80 mg total) by mouth at bedtime. alvia Bring, DO  Active Self, Pharmacy Records  budesonide -glycopyrrolate -formoterol  (BREZTRI  AEROSPHERE) 160-9-4.8 MCG/ACT AERO inhaler [id number] Yes Inhale 2 puffs into the lungs 2 (two) times daily. alvia Bring, DO  Active   clopidogrel  (PLAVIX ) 75 MG tablet [id number] Yes Take 1 tablet (75 mg total) by mouth daily. alvia Bring, DO  Active   doxycycline  (VIBRAMYCIN ) 100 MG capsule [id number] Yes Take 1 capsule (100 mg total) by mouth 2 (two) times daily. Maranda Jamee Jacob, MD  Active   DULoxetine  (CYMBALTA ) 30 MG capsule [id number] Yes Take 1 capsule (30 mg total) by mouth daily. alvia Bring, DO  Active   EPINEPHrine  0.3 mg/0.3 mL IJ SOAJ injection [id number] Yes Inject 0.3 mg into the muscle as needed for anaphylaxis. [provider]  Active Self, Pharmacy Records  furosemide  (LASIX ) 40 MG tablet [id number] Yes Take 2 tablets (80 mg total) by mouth as needed for edema. 40 mg in the morning and 40mg  night alvia Bring, DO  Active   gabapentin  (NEURONTIN ) 300 MG capsule [id number] Yes Take 1 capsule (300 mg total) by mouth 2 (two) times daily for 7 days, THEN 1 capsule (300 mg total) 3 (three) times daily. Curtis Debby PARAS, MD  Active   KLOR-CON  M20 20 MEQ tablet [id number] Yes Take 20 mEq by mouth as needed (take with lasix ). [provider]  Active Self, Pharmacy Records  metoprolol  succinate (TOPROL -XL) 50 MG 24 hr tablet [id number] Yes Take 1 tablet (50 mg total) by mouth daily. alvia Bring, DO  Active   Multiple Vitamin (MULTIVITAMIN) tablet [id number] Yes Take 1 tablet by mouth daily. [provider]  Active Self, Pharmacy Records  nitroGLYCERIN  (  NITROSTAT ) 0.4 MG SL tablet [id number] Yes Place 1 tablet (0.4 mg total) under the tongue every 5 (five) minutes as needed for chest pain. Alvia Bring, DO  Active   omeprazole (PRILOSEC) 40 MG capsule [id number] Yes Take 40 mg by mouth daily.  [provider]  Active Self, Pharmacy Records  predniSONE  (DELTASONE ) 20 MG tablet [id number] Yes Take 2 tablets (40 mg total) by mouth daily with breakfast. Maranda Jamee Jacob, MD  Active   ranolazine  (RANEXA ) 500 MG 12 hr tablet [id number] Yes Take 500 mg by mouth 2 (two) times daily. [provider]  Active Self, Pharmacy Records  SYMBICORT  160-4.5 MCG/ACT inhaler [id number] Yes INHALE 2 PUFFS INTO THE LUNGS IN THE MORNING AND AT BEDTIME Alvia Bring, DO  Active Self, Pharmacy Records  tadalafil  (CIALIS ) 20 MG tablet [id number] Yes Take 1 tablet (20 mg total) by mouth as needed for erectile dysfunction. Stoneking, Adine PARAS., MD  Active             Home Care and Equipment/Supplies: Were Home Health Services Ordered?: NA Any new equipment or medical supplies ordered?: NA  Functional Questionnaire: Do you need assistance with bathing/showering or dressing?: No Do you need assistance with meal preparation?: No Do you need assistance with eating?: No Do you have difficulty maintaining continence: No Do you need assistance with getting out of bed/getting out of a chair/moving?: No Do you have difficulty managing or taking your medications?: No  Follow up appointments reviewed: PCP Follow-up appointment confirmed?: Yes Date of PCP follow-up appointment?: 11/13/23 Follow-up Provider: [facility] Follow-up appointment confirmed?: NA Do you need transportation to your follow-up appointment?: No Do you understand care options if your condition(s) worsen?: Yes-patient verbalized understanding    SIGNATURE Julian Lemmings, LPN [facility] Nurse Health Advisor Direct Dial [phone]

## 2023-11-06 NOTE — Telephone Encounter (Signed)
 Pended glucometer, strips and lancets.

## 2023-11-07 ENCOUNTER — Other Ambulatory Visit (HOSPITAL_COMMUNITY): Payer: Self-pay

## 2023-11-07 ENCOUNTER — Other Ambulatory Visit: Payer: Self-pay

## 2023-11-08 ENCOUNTER — Other Ambulatory Visit: Payer: Self-pay

## 2023-11-09 ENCOUNTER — Other Ambulatory Visit: Payer: Self-pay

## 2023-11-13 ENCOUNTER — Ambulatory Visit (INDEPENDENT_AMBULATORY_CARE_PROVIDER_SITE_OTHER): Admitting: Family Medicine

## 2023-11-13 ENCOUNTER — Encounter: Payer: Self-pay | Admitting: Family Medicine

## 2023-11-13 VITALS — BP 125/82 | HR 70 | Ht 73.0 in | Wt 227.0 lb

## 2023-11-13 DIAGNOSIS — N1831 Chronic kidney disease, stage 3a: Secondary | ICD-10-CM | POA: Diagnosis not present

## 2023-11-13 DIAGNOSIS — I255 Ischemic cardiomyopathy: Secondary | ICD-10-CM | POA: Diagnosis not present

## 2023-11-13 DIAGNOSIS — I10 Essential (primary) hypertension: Secondary | ICD-10-CM

## 2023-11-13 DIAGNOSIS — R7303 Prediabetes: Secondary | ICD-10-CM | POA: Diagnosis not present

## 2023-11-13 MED ORDER — OMEPRAZOLE 40 MG PO CPDR
40.0000 mg | DELAYED_RELEASE_CAPSULE | Freq: Two times a day (BID) | ORAL | 1 refills | Status: DC
  Filled 2023-11-13: qty 180, 90d supply, fill #0
  Filled 2024-02-07: qty 180, 90d supply, fill #1

## 2023-11-13 NOTE — Assessment & Plan Note (Deleted)
 4 blood pressure is well-controlled at this time.  Holding metoprolol  due to the syncopal episode.  He does plan to follow-up with cardiology.  Requesting referral to cardiologist in [place].

## 2023-11-13 NOTE — Progress Notes (Signed)
 Paul Hardy - 63 y.o. male MRN [id number]  Date of birth: [birth year]  Subjective Chief Complaint  Patient presents with   Hospitalization Follow-up    HPI Paul Hardy is a 63 year old male here today for follow-up of recent hospitalization.  He had syncopal event possibly related to bradycardia from metoprolol .  Initially evaluated in the ED and was admitted for additional workup.  Recommend he remain off metoprolol  until seen by cardiology again.  He was also told that he had diabetes.  His A1c was 7.4% in the hospital.  He does not agree that he has diabetes.  He was prescribed metformin but has not started taking this yet.  He has been monitoring his blood sugars at home which have been well-controlled.  ROS:  A comprehensive ROS was completed and negative except as noted per HPI  Allergies  Allergen Reactions   Bee Venom Anaphylaxis   Iodinated Contrast Media Anaphylaxis    Rash and Shortness of Breath   Hydrochlorothiazide Other (See Comments)    Dehydration and muscle spasms. Lasix  does the same   Lisinopril Other (See Comments) and Cough    Losartan  is OK    Past Medical History:  Diagnosis Date   CHF (congestive heart failure) (HCC)    Coronary artery disease    Hypertension     Past Surgical History:  Procedure Laterality Date   CARDIAC SURGERY     CHOLECYSTECTOMY, LAPAROSCOPIC     VASECTOMY      Social History   Socioeconomic History   Marital status: Married    Spouse name: Not on file   Number of children: 2   Years of education: Not on file   Highest education level: Associate degree: occupational, Scientist, product/process development, or vocational program  Occupational History   Not on file  Tobacco Use   Smoking status: Former    Current packs/day: 0.00    Average packs/day: 1 pack/day for 22.8 years (22.8 ttl pk-yrs)    Types: Cigarettes    Start date: 02/21/1998    Quit date: 11/24/2020    Years since quitting: 2.9    Passive exposure: Never   Smokeless tobacco:  Never  Vaping Use   Vaping status: Never Used  Substance and Sexual Activity   Alcohol use: Yes    Comment: social drinker   Drug use: Never   Sexual activity: Not on file  Other Topics Concern   Not on file  Social History Narrative   Lives with wife   Social Drivers of Health   Financial Resource Strain: Low Risk  (08/07/2023)   Overall Financial Resource Strain (CARDIA)    Difficulty of Paying Living Expenses: Not hard at all  Food Insecurity: No Food Insecurity (11/03/2023)   Received from [facility]   Hunger Vital Sign    Within the past 12 months, you worried that your food would run out before you got the money to buy more.: Never true    Ran Out of Food in the Last Year: Never true  Transportation Needs: No Transportation Needs (11/03/2023)   Received from [facility] - Transportation    In the past 12 months, has lack of transportation kept you from medical appointments or from getting medications?: No    In the past 12 months, has lack of transportation kept you from meetings, work, or from getting things needed for daily living?: No  Physical Activity: Sufficiently Active (08/07/2023)   Exercise Vital Sign    Days of Exercise  per Week: 5 days    Minutes of Exercise per Session: 30 min  Stress: No Stress Concern Present (11/02/2023)   Received from [facility] - Occupational Stress Questionnaire    Do you feel stress - tense, restless, nervous, or anxious, or unable to sleep at night because your mind is troubled all the time - these days?: Not at all  Social Connections: Socially Integrated (08/07/2023)   Social Connection and Isolation Panel    Frequency of Communication with Friends and Family: Twice a week    Frequency of Social Gatherings with Friends and Family: Once a week    Attends Religious Services: 1 to 4 times per year    Active Member of Clubs or Organizations: Yes    Attends Hospital doctor: More than 4 times per year    Marital Status: Married    Family History  Problem Relation Age of Onset   Cancer Mother    Diabetes Father    Heart Problems Father     Health Maintenance  Topic Date Due   Diabetic kidney evaluation - Urine ACR  Never done   Influenza Vaccine  09/22/2023   COVID-19 Vaccine (7 - Moderna risk 2024-25 season) 10/23/2023   Lung Cancer Screening  04/02/2024   Diabetic kidney evaluation - eGFR measurement  09/12/2024   DTaP/Tdap/Td (3 - Td or Tdap) 06/07/2029   Colonoscopy  09/28/2033   Pneumococcal Vaccine: 50+ Years  Completed   Hepatitis C Screening  Completed   HIV Screening  Completed   Zoster Vaccines- Shingrix  Completed   Hepatitis B Vaccines 19-59 Average Risk  Aged Out   HPV VACCINES  Aged Out   Meningococcal B Vaccine  Aged Out   Fecal DNA (Cologuard)  Discontinued     ----------------------------------------------------------------------------------------------------------------------------------------------------------------------------------------------------------------- Physical Exam BP 125/82 (BP Location: Left Arm, Patient Position: Sitting, Cuff Size: Normal)   Pulse 70   Ht 6' 1 (1.854 m)   Wt 227 lb (103 kg)   SpO2 97%   BMI 29.95 kg/m   Physical Exam Constitutional:      Appearance: Normal appearance.  HENT:     Head: Normocephalic and atraumatic.  Cardiovascular:     Rate and Rhythm: Normal rate and regular rhythm.  Pulmonary:     Effort: Pulmonary effort is normal.     Breath sounds: Normal breath sounds.  Neurological:     General: No focal deficit present.     Mental Status: He is alert.  Psychiatric:        Mood and Affect: Mood normal.        Behavior: Behavior normal.     ------------------------------------------------------------------------------------------------------------------------------------------------------------------------------------------------------------------- Assessment  and Plan  Prediabetes His most recent A1c in the hospital was 7.4%.  Discussed that this is consistent with diabetes.  Will plan to recheck in 3 months as his blood sugars at home seem to well-controlled.  Provided with sample of libre CGM.  Essential hypertension Blood pressure is well-controlled at this time.  Holding metoprolol  due to the syncopal episode.  He does plan to follow-up with cardiology.  Requesting referral to cardiologist in [place].   Meds ordered this encounter  Medications   omeprazole  (PRILOSEC) 40 MG capsule    Sig: Take 1 capsule (40 mg total) by mouth in the morning and at bedtime.    Dispense:  180 capsule    Refill:  1    Return in about 3 months (around 02/12/2024) for elevated  blood glucose.

## 2023-11-13 NOTE — Assessment & Plan Note (Signed)
 Blood pressure is well-controlled at this time.  Holding metoprolol  due to the syncopal episode.  He does plan to follow-up with cardiology.  Requesting referral to cardiologist in [place].

## 2023-11-13 NOTE — Assessment & Plan Note (Signed)
 His most recent A1c in the hospital was 7.4%.  Discussed that this is consistent with diabetes.  Will plan to recheck in 3 months as his blood sugars at home seem to well-controlled.  Provided with sample of libre CGM.

## 2023-11-14 ENCOUNTER — Other Ambulatory Visit: Payer: Self-pay

## 2023-11-14 ENCOUNTER — Other Ambulatory Visit (HOSPITAL_COMMUNITY): Payer: Self-pay

## 2023-11-14 DIAGNOSIS — R3129 Other microscopic hematuria: Secondary | ICD-10-CM

## 2023-11-15 ENCOUNTER — Encounter: Payer: Self-pay | Admitting: Family Medicine

## 2023-11-16 MED ORDER — FREESTYLE LIBRE 3 PLUS SENSOR MISC
0 refills | Status: DC
  Filled 2023-11-16 – 2023-11-24 (×4): qty 6, 90d supply, fill #0

## 2023-11-16 NOTE — Telephone Encounter (Signed)
 Pended freestyle libre 3 plus

## 2023-11-17 ENCOUNTER — Other Ambulatory Visit: Payer: Self-pay

## 2023-11-17 ENCOUNTER — Other Ambulatory Visit (HOSPITAL_COMMUNITY): Payer: Self-pay

## 2023-11-17 ENCOUNTER — Encounter: Payer: Self-pay | Admitting: Pharmacist

## 2023-11-17 ENCOUNTER — Telehealth (HOSPITAL_COMMUNITY): Payer: Self-pay

## 2023-11-17 ENCOUNTER — Telehealth: Payer: Self-pay

## 2023-11-17 NOTE — Telephone Encounter (Signed)
 Copied from CRM [id number]. Topic: Clinical - Medication Prior Auth >> Nov 17, 2023 12:19 PM Delon HERO wrote: Reason for CRM: Bernarda calling from [place] is calling to request a PA for LibrePlus sensor to be sent to [employer].  [phone]

## 2023-11-20 ENCOUNTER — Telehealth: Payer: Self-pay

## 2023-11-20 ENCOUNTER — Other Ambulatory Visit (HOSPITAL_COMMUNITY): Payer: Self-pay

## 2023-11-20 NOTE — Telephone Encounter (Signed)
 Pharmacy Patient Advocate Encounter   Received notification from RX Request Messages that prior authorization for Freestyle Libre 3 Plus sensors is required/requested.   Insurance verification completed.   The patient is insured through [facility] .   Per test claim: PA required; PA started via CoverMyMeds. KEY [id number] . Please see clinical question(s) below that I am not finding the answer to in their chart and advise.   Dx for the sensors?

## 2023-11-21 ENCOUNTER — Other Ambulatory Visit (HOSPITAL_COMMUNITY): Payer: Self-pay

## 2023-11-21 ENCOUNTER — Other Ambulatory Visit: Payer: Self-pay

## 2023-11-22 ENCOUNTER — Other Ambulatory Visit (HOSPITAL_BASED_OUTPATIENT_CLINIC_OR_DEPARTMENT_OTHER): Payer: Self-pay

## 2023-11-22 ENCOUNTER — Other Ambulatory Visit: Payer: Self-pay

## 2023-11-22 ENCOUNTER — Encounter: Payer: Self-pay | Admitting: Family Medicine

## 2023-11-22 ENCOUNTER — Other Ambulatory Visit (HOSPITAL_COMMUNITY): Payer: Self-pay

## 2023-11-23 NOTE — Telephone Encounter (Signed)
 Can I add DM type 2 to patient problem list ?  Had recent A1C result of 7.2 with urology.

## 2023-11-24 ENCOUNTER — Other Ambulatory Visit: Payer: Self-pay

## 2023-11-24 ENCOUNTER — Other Ambulatory Visit (HOSPITAL_COMMUNITY): Payer: Self-pay

## 2023-11-24 DIAGNOSIS — E119 Type 2 diabetes mellitus without complications: Secondary | ICD-10-CM | POA: Insufficient documentation

## 2023-11-24 NOTE — Telephone Encounter (Signed)
 Pharmacy Patient Advocate Encounter  Received notification from MED[employer] that Prior Authorization for Freestyle Libre 3 Plus sensor  has been APPROVED from 11/24/23 to 11/22/24. Ran test claim, Copay is $0. This test claim was processed through [facility]- copay amounts may vary at other pharmacies due to pharmacy/plan contracts, or as the patient moves through the different stages of their insurance plan.   PA #/Case ID/Reference #: [id number]

## 2023-11-24 NOTE — Telephone Encounter (Signed)
 Pharmacy Patient Advocate Encounter   Received notification from Patient Advice Request messages that prior authorization for Freestyle Libre 3 Plus sensor is required/requested.   Insurance verification completed.   The patient is insured through [facility].   Per test claim: PA required; PA submitted to above mentioned insurance via Latent Key/confirmation #/EOC [id number] Status is pending

## 2023-11-27 ENCOUNTER — Other Ambulatory Visit

## 2023-11-27 DIAGNOSIS — R3129 Other microscopic hematuria: Secondary | ICD-10-CM | POA: Diagnosis not present

## 2023-11-27 LAB — URINALYSIS, ROUTINE W REFLEX MICROSCOPIC
Bilirubin, UA: NEGATIVE
Glucose, UA: NEGATIVE
Ketones, UA: NEGATIVE
Leukocytes,UA: NEGATIVE
Nitrite, UA: NEGATIVE
Protein,UA: NEGATIVE
Specific Gravity, UA: 1.025 (ref 1.005–1.030)
Urobilinogen, Ur: 4 mg/dL — ABNORMAL HIGH (ref 0.2–1.0)
pH, UA: 6 (ref 5.0–7.5)

## 2023-11-27 LAB — MICROSCOPIC EXAMINATION: Bacteria, UA: NONE SEEN

## 2023-11-28 ENCOUNTER — Ambulatory Visit: Payer: Self-pay | Admitting: Urology

## 2023-11-28 DIAGNOSIS — R3129 Other microscopic hematuria: Secondary | ICD-10-CM

## 2023-12-02 ENCOUNTER — Encounter (HOSPITAL_BASED_OUTPATIENT_CLINIC_OR_DEPARTMENT_OTHER): Payer: Self-pay

## 2023-12-02 ENCOUNTER — Ambulatory Visit (HOSPITAL_BASED_OUTPATIENT_CLINIC_OR_DEPARTMENT_OTHER)
Admission: RE | Admit: 2023-12-02 | Discharge: 2023-12-02 | Disposition: A | Discharge status: Home / Self Care | Source: Ambulatory Visit | Attending: Urology | Admitting: Urology

## 2023-12-02 DIAGNOSIS — R3129 Other microscopic hematuria: Secondary | ICD-10-CM

## 2023-12-04 ENCOUNTER — Encounter: Payer: Self-pay | Admitting: Urology

## 2023-12-04 ENCOUNTER — Other Ambulatory Visit (HOSPITAL_COMMUNITY): Payer: Self-pay

## 2023-12-04 ENCOUNTER — Other Ambulatory Visit: Payer: Self-pay

## 2023-12-04 ENCOUNTER — Other Ambulatory Visit: Payer: Self-pay | Admitting: Urology

## 2023-12-04 DIAGNOSIS — R3129 Other microscopic hematuria: Secondary | ICD-10-CM

## 2023-12-07 ENCOUNTER — Ambulatory Visit (HOSPITAL_BASED_OUTPATIENT_CLINIC_OR_DEPARTMENT_OTHER)
Admission: RE | Admit: 2023-12-07 | Discharge: 2023-12-07 | Disposition: A | Discharge status: Home / Self Care | Source: Ambulatory Visit | Attending: Urology | Admitting: Urology

## 2023-12-07 ENCOUNTER — Ambulatory Visit (HOSPITAL_BASED_OUTPATIENT_CLINIC_OR_DEPARTMENT_OTHER)

## 2023-12-07 DIAGNOSIS — K5730 Diverticulosis of large intestine without perforation or abscess without bleeding: Secondary | ICD-10-CM | POA: Diagnosis not present

## 2023-12-07 DIAGNOSIS — R3129 Other microscopic hematuria: Secondary | ICD-10-CM | POA: Diagnosis present

## 2023-12-07 DIAGNOSIS — N200 Calculus of kidney: Secondary | ICD-10-CM | POA: Diagnosis not present

## 2023-12-13 ENCOUNTER — Ambulatory Visit: Payer: Self-pay | Admitting: Urology

## 2023-12-16 ENCOUNTER — Other Ambulatory Visit: Payer: Self-pay | Admitting: Family Medicine

## 2023-12-16 DIAGNOSIS — E785 Hyperlipidemia, unspecified: Secondary | ICD-10-CM

## 2023-12-17 ENCOUNTER — Other Ambulatory Visit: Payer: Self-pay

## 2023-12-18 ENCOUNTER — Other Ambulatory Visit: Payer: Self-pay

## 2023-12-18 ENCOUNTER — Other Ambulatory Visit (HOSPITAL_COMMUNITY): Payer: Self-pay

## 2023-12-18 MED ORDER — ATORVASTATIN CALCIUM 80 MG PO TABS
80.0000 mg | ORAL_TABLET | Freq: Every day | ORAL | 3 refills | Status: AC
  Filled 2023-12-18: qty 90, 90d supply, fill #0
  Filled 2024-02-07 – 2024-03-13 (×3): qty 90, 90d supply, fill #1

## 2023-12-25 ENCOUNTER — Other Ambulatory Visit (HOSPITAL_COMMUNITY): Payer: Self-pay

## 2023-12-25 ENCOUNTER — Other Ambulatory Visit (HOSPITAL_BASED_OUTPATIENT_CLINIC_OR_DEPARTMENT_OTHER): Payer: Self-pay

## 2023-12-25 ENCOUNTER — Other Ambulatory Visit: Payer: Self-pay

## 2023-12-26 ENCOUNTER — Other Ambulatory Visit: Payer: Self-pay

## 2023-12-29 ENCOUNTER — Ambulatory Visit (INDEPENDENT_AMBULATORY_CARE_PROVIDER_SITE_OTHER): Admitting: Urology

## 2023-12-29 ENCOUNTER — Encounter: Payer: Self-pay | Admitting: Urology

## 2023-12-29 VITALS — BP 164/106 | HR 67 | Ht 73.0 in | Wt 200.0 lb

## 2023-12-29 DIAGNOSIS — N529 Male erectile dysfunction, unspecified: Secondary | ICD-10-CM | POA: Diagnosis not present

## 2023-12-29 DIAGNOSIS — R3129 Other microscopic hematuria: Secondary | ICD-10-CM

## 2023-12-29 DIAGNOSIS — N138 Other obstructive and reflux uropathy: Secondary | ICD-10-CM

## 2023-12-29 DIAGNOSIS — N200 Calculus of kidney: Secondary | ICD-10-CM | POA: Diagnosis not present

## 2023-12-29 DIAGNOSIS — N281 Cyst of kidney, acquired: Secondary | ICD-10-CM | POA: Diagnosis not present

## 2023-12-29 DIAGNOSIS — N401 Enlarged prostate with lower urinary tract symptoms: Secondary | ICD-10-CM | POA: Diagnosis not present

## 2023-12-29 LAB — URINALYSIS, ROUTINE W REFLEX MICROSCOPIC
Bilirubin, UA: NEGATIVE
Glucose, UA: NEGATIVE
Ketones, UA: NEGATIVE
Nitrite, UA: NEGATIVE
Protein,UA: NEGATIVE
RBC, UA: NEGATIVE
Specific Gravity, UA: 1.015 (ref 1.005–1.030)
Urobilinogen, Ur: 2 mg/dL — ABNORMAL HIGH (ref 0.2–1.0)
pH, UA: 7 (ref 5.0–7.5)

## 2023-12-29 LAB — MICROSCOPIC EXAMINATION

## 2023-12-29 MED ORDER — CIPROFLOXACIN HCL 500 MG PO TABS
500.0000 mg | ORAL_TABLET | Freq: Once | ORAL | Status: AC
  Administered 2023-12-29: 500 mg via ORAL

## 2023-12-29 NOTE — Progress Notes (Signed)
 Assessment: 1. Microscopic hematuria; negative evaluation 11/25   2. BPH with obstruction/lower urinary tract symptoms   3. 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 Organic impotence   5. Nephrolithiasis     Plan: I personally reviewed the CT study from 10/25 with results as noted below. Continue alfuzosin  10 mg daily.  Refill sent. Continue tadalafil  prn.  Refill sent. Cipro  x 1 following cystoscopy Return to office in 6 months  Chief Complaint: Chief Complaint  Patient presents with   Cysto    HPI: Paul Hardy is a 63 y.o. male who presents for continued evaluation of a right renal cyst and BPH with LUTS. He was found to have a small minimally complex right renal cyst. 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 Patient has pmhx of CAD s/p CABG, hypertension, dyslipidemia, obstructive sleep apnea, cannot tolerate CPAP mask.     Patient also has stage III CKD with recent creatinine 1.62/GFR = 48   Patient was found to have a 2.1 cm minimally complex right renal cystic lesion classified as Bosniak 7F in October 2023.  Also noted to have a 10mm left nonobstructing renal calculus.  He saw a urologist at that time at [facility] who recommended ongoing follow-up given its classification.  MRI abdomen with and without contrast from 2/24 showed small Bosniak category 1 and 2 cyst in both kidneys.  No additional follow-up for the cyst was recommended. CT abdomen and pelvis without contrast from 2/25 for evaluation abdominal pain showed a 10 mm left renal calculus without obstruction and stable bilateral renal cysts.  He has a history of BPH with lower urinary tract symptoms and had been managed with tamsulosin . PSA from 6/24: 0.96  At his visit in April 2025, he continued on tamsulosin .  He reported some increase in his urinary symptoms.  His lower urinary tract symptoms remained intermittent in nature.  He noted frequency after taking his Lasix  in the morning.  He reported difficulty with a slow  stream.  No dysuria or gross hematuria. IPSS = 13/3. PVR = 39 mL  He was changed to alfuzosin  in April 2025. At his visit in June 2025, he continued on alfuzosin .  He had not seen a significant improvement in his lower urinary tract symptoms with the medication.  Overall, his lower urinary tract symptoms remain stable.  No dysuria or gross hematuria.  No side effects from the medication. IPSS = 14/3. He continued on alfuzosin . PSA 6/25: 1.3  At his visit in September 2025, he continued on alfuzosin  10 mg daily.  He had symptoms of decreased stream.  No dysuria or gross hematuria.  His urinary symptoms were stable. IPSS = 13/3. He continues to use tadalafil  for ED with satisfactory results. Urinalysis showed 3-10 RBCs. Repeat urinalysis from 11/27/2023 again showed 3-10 RBCs. CT renal stone study from 12/07/2023 showed stable left nephrolithiasis, no renal mass or evidence of obstruction.  He presents today for cystoscopy. No new lower urinary tract symptoms.  He continues on alfuzosin .  No dysuria or gross hematuria.  Portions of the above documentation were copied from a prior visit for review purposes only.  Allergies: Allergies  Allergen Reactions   Bee Venom Anaphylaxis   Iodinated Contrast Media Anaphylaxis    Rash and Shortness of Breath   Hydrochlorothiazide Other (See Comments)    Dehydration and muscle spasms. Lasix  does the same   Lisinopril Other (See Comments) and Cough    Losartan  is OK    PMH: Past  Medical History:  Diagnosis Date   CHF (congestive heart failure) (HCC)    Coronary artery disease    Hypertension     PSH: Past Surgical History:  Procedure Laterality Date   CARDIAC SURGERY     CHOLECYSTECTOMY, LAPAROSCOPIC     VASECTOMY      SH: Social History   Tobacco Use   Smoking status: Former    Current packs/day: 0.00    Average packs/day: 1 pack/day for 22.8 years (22.8 ttl pk-yrs)    Types: Cigarettes    Start date: 02/21/1998    Quit date:  11/24/2020    Years since quitting: 3.0    Passive exposure: Never   Smokeless tobacco: Never  Vaping Use   Vaping status: Never Used  Substance Use Topics   Alcohol use: Yes    Comment: social drinker   Drug use: Never    ROS: Constitutional:  Negative for fever, chills, weight loss CV: Negative for chest pain, previous MI, hypertension Respiratory:  Negative for shortness of breath, wheezing, sleep apnea, frequent cough GI:  Negative for nausea, vomiting, bloody stool, GERD  PE: BP (!) 164/106   Pulse 67   Ht 6' 1 (1.854 m)   Wt 200 lb (90.7 kg)   BMI 26.39 kg/m  GENERAL APPEARANCE:  Well appearing, well developed, well nourished, NAD HEENT:  Atraumatic, normocephalic, oropharynx clear NECK:  Supple without lymphadenopathy or thyromegaly ABDOMEN:  Soft, non-tender, no masses EXTREMITIES:  Moves all extremities well, without clubbing, cyanosis, or edema NEUROLOGIC:  Alert and oriented x 3, normal gait, CN II-XII grossly intact MENTAL STATUS:  appropriate BACK:  Non-tender to palpation, No CVAT SKIN:  Warm, dry, and intact  Results: U/A: 0-5 WBCs, 0-2 RBCs  Procedure:  Flexible Cystourethroscopy  Pre-operative Diagnosis: Microscopic hematuria  Post-operative Diagnosis: Microscopic hematuria  Anesthesia:  local with lidocaine jelly  Surgical Narrative:  After appropriate informed consent was obtained, the patient was prepped and draped in the usual sterile fashion in the supine position.  The patient was correctly identified and the proper procedure delineated prior to proceeding.  Sterile lidocaine gel was instilled in the urethra. The flexible cystoscope was introduced without difficulty.  Findings:  Anterior urethra: Normal  Posterior urethra: Lateral lobe hypertrophy  Bladder: no mucosal lesions seen  Ureteral orifices: normal  Additional findings:  none  Saline bladder wash for cytology was not performed.    The cystoscope was then removed.  The  patient tolerated the procedure well.

## 2024-02-04 ENCOUNTER — Other Ambulatory Visit (HOSPITAL_COMMUNITY): Payer: Self-pay

## 2024-02-07 ENCOUNTER — Other Ambulatory Visit (HOSPITAL_COMMUNITY): Payer: Self-pay

## 2024-02-07 ENCOUNTER — Other Ambulatory Visit: Payer: Self-pay | Admitting: Family Medicine

## 2024-02-07 ENCOUNTER — Other Ambulatory Visit: Payer: Self-pay

## 2024-02-07 DIAGNOSIS — I255 Ischemic cardiomyopathy: Secondary | ICD-10-CM

## 2024-02-07 DIAGNOSIS — Z951 Presence of aortocoronary bypass graft: Secondary | ICD-10-CM

## 2024-02-07 MED ORDER — CLOPIDOGREL BISULFATE 75 MG PO TABS
75.0000 mg | ORAL_TABLET | Freq: Every day | ORAL | 0 refills | Status: DC
  Filled 2024-02-07: qty 90, 90d supply, fill #0

## 2024-02-07 MED ORDER — DULOXETINE HCL 30 MG PO CPEP
30.0000 mg | ORAL_CAPSULE | Freq: Every day | ORAL | 0 refills | Status: AC
  Filled 2024-02-07 – 2024-02-20 (×2): qty 90, 90d supply, fill #0

## 2024-02-07 MED ORDER — FREESTYLE LIBRE 3 PLUS SENSOR MISC
0 refills | Status: AC
  Filled 2024-02-07: qty 6, 90d supply, fill #0

## 2024-02-13 ENCOUNTER — Ambulatory Visit (INDEPENDENT_AMBULATORY_CARE_PROVIDER_SITE_OTHER): Admitting: Family Medicine

## 2024-02-13 ENCOUNTER — Encounter: Payer: Self-pay | Admitting: Family Medicine

## 2024-02-13 VITALS — BP 130/83 | HR 72 | Ht 73.0 in | Wt 226.0 lb

## 2024-02-13 DIAGNOSIS — R0600 Dyspnea, unspecified: Secondary | ICD-10-CM

## 2024-02-13 DIAGNOSIS — I10 Essential (primary) hypertension: Secondary | ICD-10-CM

## 2024-02-13 DIAGNOSIS — E119 Type 2 diabetes mellitus without complications: Secondary | ICD-10-CM | POA: Diagnosis not present

## 2024-02-13 NOTE — Assessment & Plan Note (Signed)
Blood pressure is well-controlled at this time.  Recommend continuation of current medications. 

## 2024-02-13 NOTE — Assessment & Plan Note (Signed)
 A1c improved but still in diabetes range.  Encouraged continued dietary changes for glucose improvement. F/u in 4 months.

## 2024-02-13 NOTE — Progress Notes (Signed)
 " Paul Hardy - 63 y.o. male MRN [id number]  Date of birth: 14-Mar-1960  Subjective Chief Complaint  Patient presents with   Medical Management of Chronic Issues    DM- pt has been having some low glucose levels throughout the night, as low as 67. Pt glucose after meals varies     HPI Paul Hardy is a 63 y.o. male here today for follow up visit.   Previous A1c in the diabetes range at 7.4%.  He has been working on dietary changes and A1c today is improved to 6.6%.  He denies symptoms at this time.  He is monitoring using CGM at this time.  Has been alerted to a couple of instances of blood sugars in the 60's.    He reports that .cardiology added amlodipine recently.  BP remains well controlled.  Denies chest pain, increased dyspnea, edema,  palpitations, or dizziness.  ROS:  A comprehensive ROS was completed and negative except as noted per HPI  Allergies[1]  Past Medical History:  Diagnosis Date   CHF (congestive heart failure) (HCC)    Coronary artery disease    Hypertension     Past Surgical History:  Procedure Laterality Date   CARDIAC SURGERY     CHOLECYSTECTOMY, LAPAROSCOPIC     VASECTOMY      Social History   Socioeconomic History   Marital status: Married    Spouse name: Not on file   Number of children: 2   Years of education: Not on file   Highest education level: Associate degree: occupational, scientist, product/process development, or vocational program  Occupational History   Not on file  Tobacco Use   Smoking status: Former    Current packs/day: 0.00    Average packs/day: 1 pack/day for 22.8 years (22.8 ttl pk-yrs)    Types: Cigarettes    Start date: 02/21/1998    Quit date: 11/24/2020    Years since quitting: 3.2    Passive exposure: Never   Smokeless tobacco: Never  Vaping Use   Vaping status: Never Used  Substance and Sexual Activity   Alcohol use: Yes    Comment: social drinker   Drug use: Never   Sexual activity: Not on file  Other Topics Concern   Not on file   Social History Narrative   Lives with wife   Social Drivers of Health   Tobacco Use: Medium Risk (02/13/2024)   Patient History    Smoking Tobacco Use: Former    Smokeless Tobacco Use: Never    Passive Exposure: Never  Physicist, Medical Strain: Low Risk (02/11/2024)   Overall Financial Resource Strain (CARDIA)    Difficulty of Paying Living Expenses: Not hard at all  Food Insecurity: No Food Insecurity (02/11/2024)   Epic    Worried About Radiation Protection Practitioner of Food in the Last Year: Never true    Ran Out of Food in the Last Year: Never true  Transportation Needs: No Transportation Needs (02/11/2024)   Epic    Lack of Transportation (Medical): No    Lack of Transportation (Non-Medical): No  Physical Activity: Sufficiently Active (02/11/2024)   Exercise Vital Sign    Days of Exercise per Week: 7 days    Minutes of Exercise per Session: 60 min  Stress: Stress Concern Present (02/11/2024)   [employer] of Occupational Health - Occupational Stress Questionnaire    Feeling of Stress: To some extent  Social Connections: Socially Integrated (02/11/2024)   Social Connection and Isolation Panel    Frequency of Communication with  Friends and Family: More than three times a week    Frequency of Social Gatherings with Friends and Family: Once a week    Attends Religious Services: 1 to 4 times per year    Active Member of Clubs or Organizations: Yes    Attends Banker Meetings: More than 4 times per year    Marital Status: Married  Depression (PHQ2-9): Medium Risk (02/13/2024)   Depression (PHQ2-9)    PHQ-2 Score: 5  Alcohol Screen: Low Risk (02/11/2024)   Alcohol Screen    Last Alcohol Screening Score (AUDIT): 1  Housing: Low Risk (02/11/2024)   Epic    Unable to Pay for Housing in the Last Year: No    Number of Times Moved in the Last Year: 0    Homeless in the Last Year: No  Utilities: Patient Declined (02/01/2024)   Received from [facility]    In the  past 12 months has the electric, gas, oil, or water company threatened to shut off services in your home?: Patient declined  Health Literacy: Not on file    Family History  Problem Relation Age of Onset   Cancer Mother    Diabetes Father    Heart Problems Father     Health Maintenance  Topic Date Due   FOOT EXAM  Never done   OPHTHALMOLOGY EXAM  Never done   Diabetic kidney evaluation - Urine ACR  Never done   COVID-19 Vaccine (7 - 2025-26 season) 10/23/2023   Influenza Vaccine  05/21/2024 (Originally 09/22/2023)   Lung Cancer Screening  04/02/2024   HEMOGLOBIN A1C  05/01/2024   Diabetic kidney evaluation - eGFR measurement  09/12/2024   DTaP/Tdap/Td (3 - Td or Tdap) 06/07/2029   Colonoscopy  09/28/2033   Pneumococcal Vaccine: 50+ Years  Completed   Hepatitis C Screening  Completed   HIV Screening  Completed   Zoster Vaccines- Shingrix  Completed   Hepatitis B Vaccines 19-59 Average Risk  Aged Out   HPV VACCINES  Aged Out   Meningococcal B Vaccine  Aged Out   Fecal DNA (Cologuard)  Discontinued     ----------------------------------------------------------------------------------------------------------------------------------------------------------------------------------------------------------------- Physical Exam BP 130/83 (BP Location: Left Arm, Patient Position: Sitting, Cuff Size: Normal)   Pulse 72   Ht 6' 1 (1.854 m)   Wt 226 lb (102.5 kg)   SpO2 97%   BMI 29.82 kg/m   Physical Exam Constitutional:      Appearance: Normal appearance.  Eyes:     General: No scleral icterus. Cardiovascular:     Rate and Rhythm: Normal rate and regular rhythm.  Pulmonary:     Effort: Pulmonary effort is normal.     Breath sounds: Normal breath sounds.  Musculoskeletal:     Cervical back: Neck supple.  Neurological:     General: No focal deficit present.     Mental Status: He is alert.  Psychiatric:        Mood and Affect: Mood normal.        Behavior: Behavior  normal.     ------------------------------------------------------------------------------------------------------------------------------------------------------------------------------------------------------------------- Assessment and Plan  Type 2 diabetes mellitus without complications (HCC) A1c improved but still in diabetes range.  Encouraged continued dietary changes for glucose improvement. F/u in 4 months.   Essential hypertension Blood pressure is well-controlled at this time.  Recommend continuation of current medications.    No orders of the defined types were placed in this encounter.   Return in about 4 months (around 06/13/2024) for Type 2  Diabetes.        [1]  Allergies Allergen Reactions   Bee Venom Anaphylaxis   Iodinated Contrast Media Anaphylaxis    Rash and Shortness of Breath   Hydrochlorothiazide Other (See Comments)    Dehydration and muscle spasms. Lasix  does the same   Lisinopril Other (See Comments) and Cough    Losartan  is OK   "

## 2024-02-20 ENCOUNTER — Other Ambulatory Visit (HOSPITAL_COMMUNITY): Payer: Self-pay

## 2024-02-20 ENCOUNTER — Other Ambulatory Visit: Payer: Self-pay

## 2024-02-20 ENCOUNTER — Other Ambulatory Visit: Payer: Self-pay | Admitting: Family Medicine

## 2024-02-20 DIAGNOSIS — Z951 Presence of aortocoronary bypass graft: Secondary | ICD-10-CM

## 2024-02-20 DIAGNOSIS — I255 Ischemic cardiomyopathy: Secondary | ICD-10-CM

## 2024-02-20 LAB — POCT UA - MICROALBUMIN
Albumin/Creatinine Ratio, Urine, POC: <30
Creatinine, POC: 300 mg/dL
Microalbumin Ur, POC: 30 mg/L

## 2024-02-20 LAB — POCT GLYCOSYLATED HEMOGLOBIN (HGB A1C): HbA1c, POC (controlled diabetic range): 6.6 % (ref 0.0–7.0)

## 2024-02-20 NOTE — Addendum Note (Signed)
 Addended by: OLIVA-AVELLANEDA, Petina Muraski L on: 02/20/2024 04:09 PM   Modules accepted: Orders

## 2024-02-21 ENCOUNTER — Other Ambulatory Visit (HOSPITAL_COMMUNITY): Payer: Self-pay

## 2024-02-21 ENCOUNTER — Other Ambulatory Visit: Payer: Self-pay

## 2024-02-21 MED ORDER — NITROGLYCERIN 0.4 MG SL SUBL
0.4000 mg | SUBLINGUAL_TABLET | SUBLINGUAL | 0 refills | Status: AC | PRN
  Filled 2024-02-21: qty 25, 10d supply, fill #0

## 2024-02-21 MED ORDER — OMEPRAZOLE 40 MG PO CPDR
40.0000 mg | DELAYED_RELEASE_CAPSULE | Freq: Two times a day (BID) | ORAL | 1 refills | Status: AC
  Filled 2024-02-21: qty 180, 90d supply, fill #0

## 2024-02-21 NOTE — Telephone Encounter (Signed)
 Patient requesting rx rf of   Nitroglycerine 0.4mg   Last written last written 07/07/2023 Last OV 02/13/2024 Upcoming appt 06/13/2024  This was refilled per protocol  Omeprazole  40mg   Last written 11/13/2023  These show current refills to last as 90 day supply  Clopidogrel  75mg   Last written 02/07/2024 as 90 day supply  Taylor rasher 3 plus  Last written last written 02/07/2024 as 90 day supply

## 2024-02-28 ENCOUNTER — Ambulatory Visit: Admitting: Urology

## 2024-03-13 ENCOUNTER — Other Ambulatory Visit (HOSPITAL_COMMUNITY): Payer: Self-pay

## 2024-03-13 ENCOUNTER — Other Ambulatory Visit: Payer: Self-pay | Admitting: Family Medicine

## 2024-03-13 ENCOUNTER — Other Ambulatory Visit: Payer: Self-pay

## 2024-03-13 DIAGNOSIS — Z951 Presence of aortocoronary bypass graft: Secondary | ICD-10-CM

## 2024-03-13 DIAGNOSIS — I255 Ischemic cardiomyopathy: Secondary | ICD-10-CM

## 2024-03-13 MED ORDER — CLOPIDOGREL BISULFATE 75 MG PO TABS
75.0000 mg | ORAL_TABLET | Freq: Every day | ORAL | 0 refills | Status: AC
  Filled 2024-03-13: qty 90, 90d supply, fill #0

## 2024-03-20 ENCOUNTER — Other Ambulatory Visit (HOSPITAL_COMMUNITY): Payer: Self-pay

## 2024-03-24 ENCOUNTER — Other Ambulatory Visit: Payer: Self-pay

## 2024-03-24 ENCOUNTER — Other Ambulatory Visit (HOSPITAL_COMMUNITY): Payer: Self-pay

## 2024-03-24 MED ORDER — AMLODIPINE BESYLATE 2.5 MG PO TABS
2.5000 mg | ORAL_TABLET | Freq: Every day | ORAL | 6 refills | Status: AC
  Filled 2024-03-24: qty 90, 90d supply, fill #0

## 2024-04-03 ENCOUNTER — Ambulatory Visit

## 2024-06-13 ENCOUNTER — Ambulatory Visit: Admitting: Family Medicine

## 2024-06-26 ENCOUNTER — Ambulatory Visit: Admitting: Urology

## 2024-06-27 ENCOUNTER — Ambulatory Visit: Admitting: Urology

## 2024-09-13 ENCOUNTER — Encounter: Admitting: Family Medicine
